# Patient Record
Sex: Female | Born: 1935 | Race: Black or African American | Hispanic: No | Smoking: Never smoker
Health system: Southern US, Community
[De-identification: ages and names within clinical notes are randomized; demographics above are authoritative.]

## PROBLEM LIST (undated history)

## (undated) DIAGNOSIS — I10 Essential (primary) hypertension: Secondary | ICD-10-CM

## (undated) DIAGNOSIS — K219 Gastro-esophageal reflux disease without esophagitis: Secondary | ICD-10-CM

## (undated) DIAGNOSIS — E871 Hypo-osmolality and hyponatremia: Secondary | ICD-10-CM

## (undated) DIAGNOSIS — E119 Type 2 diabetes mellitus without complications: Secondary | ICD-10-CM

## (undated) DIAGNOSIS — I509 Heart failure, unspecified: Secondary | ICD-10-CM

## (undated) DIAGNOSIS — N189 Chronic kidney disease, unspecified: Secondary | ICD-10-CM

## (undated) DIAGNOSIS — E785 Hyperlipidemia, unspecified: Secondary | ICD-10-CM

## (undated) HISTORY — DX: Hyperlipidemia, unspecified: E78.5

## (undated) HISTORY — PX: ANKLE FRACTURE SURGERY: SHX122

## (undated) HISTORY — PX: OTHER SURGICAL HISTORY: SHX169

## (undated) HISTORY — PX: HERNIA REPAIR: SHX51

## (undated) HISTORY — DX: Chronic kidney disease, unspecified: N18.9

## (undated) HISTORY — PX: PACEMAKER INSERTION: SHX728

## (undated) HISTORY — DX: Essential (primary) hypertension: I10

## (undated) HISTORY — DX: Hypo-osmolality and hyponatremia: E87.1

## (undated) HISTORY — DX: Gastro-esophageal reflux disease without esophagitis: K21.9

## (undated) HISTORY — DX: Heart failure, unspecified: I50.9

## (undated) HISTORY — DX: Type 2 diabetes mellitus without complications: E11.9

## (undated) HISTORY — PX: ABDOMINAL HYSTERECTOMY: SHX81

---

## 2006-05-06 ENCOUNTER — Ambulatory Visit: Payer: Self-pay | Admitting: *Deleted

## 2009-02-20 ENCOUNTER — Ambulatory Visit: Payer: Self-pay | Admitting: Vascular Surgery

## 2011-10-01 LAB — HM MAMMOGRAPHY

## 2014-07-03 ENCOUNTER — Emergency Department: Payer: Self-pay | Admitting: Emergency Medicine

## 2014-07-03 LAB — CBC WITH DIFFERENTIAL/PLATELET
Basophil #: 0 10*3/uL (ref 0.0–0.1)
Basophil %: 0.4 %
EOS ABS: 0.1 10*3/uL (ref 0.0–0.7)
Eosinophil %: 0.8 %
HCT: 43.6 % (ref 35.0–47.0)
HGB: 13.7 g/dL (ref 12.0–16.0)
Lymphocyte #: 1.4 10*3/uL (ref 1.0–3.6)
Lymphocyte %: 12.6 %
MCH: 29.9 pg (ref 26.0–34.0)
MCHC: 31.3 g/dL — AB (ref 32.0–36.0)
MCV: 95 fL (ref 80–100)
MONO ABS: 1 x10 3/mm — AB (ref 0.2–0.9)
MONOS PCT: 8.8 %
Neutrophil #: 8.8 10*3/uL — ABNORMAL HIGH (ref 1.4–6.5)
Neutrophil %: 77.4 %
PLATELETS: 297 10*3/uL (ref 150–440)
RBC: 4.57 10*6/uL (ref 3.80–5.20)
RDW: 14.4 % (ref 11.5–14.5)
WBC: 11.4 10*3/uL — ABNORMAL HIGH (ref 3.6–11.0)

## 2014-07-03 LAB — URINALYSIS, COMPLETE
BLOOD: NEGATIVE
GLUCOSE, UR: NEGATIVE mg/dL (ref 0–75)
Hyaline Cast: 13
Ketone: NEGATIVE
Nitrite: NEGATIVE
PH: 5 (ref 4.5–8.0)
RBC,UR: 4 /HPF (ref 0–5)
Specific Gravity: 1.019 (ref 1.003–1.030)
Squamous Epithelial: 36
Transitional Epi: 1

## 2014-07-03 LAB — COMPREHENSIVE METABOLIC PANEL
ANION GAP: 10 (ref 7–16)
Albumin: 3.7 g/dL (ref 3.4–5.0)
Alkaline Phosphatase: 54 U/L
BUN: 61 mg/dL — ABNORMAL HIGH (ref 7–18)
Bilirubin,Total: 0.3 mg/dL (ref 0.2–1.0)
CHLORIDE: 100 mmol/L (ref 98–107)
CO2: 27 mmol/L (ref 21–32)
CREATININE: 2.72 mg/dL — AB (ref 0.60–1.30)
Calcium, Total: 9.3 mg/dL (ref 8.5–10.1)
EGFR (Non-African Amer.): 18 — ABNORMAL LOW
GFR CALC AF AMER: 22 — AB
Glucose: 138 mg/dL — ABNORMAL HIGH (ref 65–99)
Osmolality: 293 (ref 275–301)
POTASSIUM: 4.3 mmol/L (ref 3.5–5.1)
SGOT(AST): 31 U/L (ref 15–37)
SGPT (ALT): 17 U/L
Sodium: 137 mmol/L (ref 136–145)
Total Protein: 8.7 g/dL — ABNORMAL HIGH (ref 6.4–8.2)

## 2014-07-03 LAB — LIPASE, BLOOD: LIPASE: 314 U/L (ref 73–393)

## 2014-07-05 ENCOUNTER — Inpatient Hospital Stay: Payer: Self-pay | Admitting: Surgery

## 2014-07-05 LAB — BASIC METABOLIC PANEL
ANION GAP: 14 (ref 7–16)
Anion Gap: 16 (ref 7–16)
BUN: 91 mg/dL — AB (ref 7–18)
BUN: 89 mg/dL — ABNORMAL HIGH (ref 7–18)
CALCIUM: 8.8 mg/dL (ref 8.5–10.1)
CHLORIDE: 94 mmol/L — AB (ref 98–107)
CHLORIDE: 96 mmol/L — AB (ref 98–107)
CO2: 21 mmol/L (ref 21–32)
CO2: 20 mmol/L — AB (ref 21–32)
Calcium, Total: 8.2 mg/dL — ABNORMAL LOW (ref 8.5–10.1)
Creatinine: 7.37 mg/dL — ABNORMAL HIGH (ref 0.60–1.30)
Creatinine: 7.63 mg/dL — ABNORMAL HIGH (ref 0.60–1.30)
EGFR (African American): 7 — ABNORMAL LOW
GFR CALC AF AMER: 7 — AB
GFR CALC NON AF AMER: 6 — AB
GFR CALC NON AF AMER: 5 — AB
Glucose: 45 mg/dL — ABNORMAL LOW (ref 65–99)
Glucose: 67 mg/dL (ref 65–99)
Osmolality: 284 (ref 275–301)
Osmolality: 290 (ref 275–301)
POTASSIUM: 4.8 mmol/L (ref 3.5–5.1)
Potassium: 4.4 mmol/L (ref 3.5–5.1)
SODIUM: 129 mmol/L — AB (ref 136–145)
Sodium: 132 mmol/L — ABNORMAL LOW (ref 136–145)

## 2014-07-05 LAB — COMPREHENSIVE METABOLIC PANEL
AST: 29 U/L (ref 15–37)
Albumin: 3.5 g/dL (ref 3.4–5.0)
Alkaline Phosphatase: 55 U/L
Anion Gap: 16 (ref 7–16)
BILIRUBIN TOTAL: 0.4 mg/dL (ref 0.2–1.0)
BUN: 87 mg/dL — ABNORMAL HIGH (ref 7–18)
CREATININE: 7.05 mg/dL — AB (ref 0.60–1.30)
Calcium, Total: 8.8 mg/dL (ref 8.5–10.1)
Chloride: 93 mmol/L — ABNORMAL LOW (ref 98–107)
Co2: 21 mmol/L (ref 21–32)
EGFR (Non-African Amer.): 6 — ABNORMAL LOW
GFR CALC AF AMER: 7 — AB
GLUCOSE: 62 mg/dL — AB (ref 65–99)
OSMOLALITY: 285 (ref 275–301)
Potassium: 4.7 mmol/L (ref 3.5–5.1)
SGPT (ALT): 18 U/L
SODIUM: 130 mmol/L — AB (ref 136–145)
TOTAL PROTEIN: 8.8 g/dL — AB (ref 6.4–8.2)

## 2014-07-05 LAB — CBC
HCT: 43.9 % (ref 35.0–47.0)
HGB: 14 g/dL (ref 12.0–16.0)
MCH: 30 pg (ref 26.0–34.0)
MCHC: 31.8 g/dL — ABNORMAL LOW (ref 32.0–36.0)
MCV: 94 fL (ref 80–100)
Platelet: 327 10*3/uL (ref 150–440)
RBC: 4.65 10*6/uL (ref 3.80–5.20)
RDW: 14.1 % (ref 11.5–14.5)
WBC: 11.6 10*3/uL — ABNORMAL HIGH (ref 3.6–11.0)

## 2014-07-05 LAB — LIPASE, BLOOD: Lipase: 483 U/L — ABNORMAL HIGH (ref 73–393)

## 2014-07-06 LAB — CBC WITH DIFFERENTIAL/PLATELET
Basophil #: 0 10*3/uL (ref 0.0–0.1)
Basophil %: 0.3 %
EOS ABS: 0.1 10*3/uL (ref 0.0–0.7)
EOS PCT: 1.1 %
HCT: 39.7 % (ref 35.0–47.0)
HGB: 12.7 g/dL (ref 12.0–16.0)
LYMPHS ABS: 1 10*3/uL (ref 1.0–3.6)
LYMPHS PCT: 11.4 %
MCH: 30.3 pg (ref 26.0–34.0)
MCHC: 32 g/dL (ref 32.0–36.0)
MCV: 95 fL (ref 80–100)
Monocyte #: 0.9 x10 3/mm (ref 0.2–0.9)
Monocyte %: 10.2 %
NEUTROS PCT: 77 %
Neutrophil #: 6.9 10*3/uL — ABNORMAL HIGH (ref 1.4–6.5)
Platelet: 276 10*3/uL (ref 150–440)
RBC: 4.19 10*6/uL (ref 3.80–5.20)
RDW: 14.5 % (ref 11.5–14.5)
WBC: 9 10*3/uL (ref 3.6–11.0)

## 2014-07-06 LAB — COMPREHENSIVE METABOLIC PANEL
ALK PHOS: 47 U/L
ALT: 12 U/L — AB
ANION GAP: 15 (ref 7–16)
Albumin: 3.2 g/dL — ABNORMAL LOW (ref 3.4–5.0)
BUN: 96 mg/dL — ABNORMAL HIGH (ref 7–18)
Bilirubin,Total: 0.3 mg/dL (ref 0.2–1.0)
CALCIUM: 8.3 mg/dL — AB (ref 8.5–10.1)
CREATININE: 8.15 mg/dL — AB (ref 0.60–1.30)
Chloride: 95 mmol/L — ABNORMAL LOW (ref 98–107)
Co2: 21 mmol/L (ref 21–32)
EGFR (African American): 6 — ABNORMAL LOW
EGFR (Non-African Amer.): 5 — ABNORMAL LOW
Glucose: 56 mg/dL — ABNORMAL LOW (ref 65–99)
Osmolality: 290 (ref 275–301)
Potassium: 4.4 mmol/L (ref 3.5–5.1)
SGOT(AST): 29 U/L (ref 15–37)
Sodium: 131 mmol/L — ABNORMAL LOW (ref 136–145)
Total Protein: 7.6 g/dL (ref 6.4–8.2)

## 2014-07-07 LAB — BASIC METABOLIC PANEL
Anion Gap: 13 (ref 7–16)
BUN: 85 mg/dL — ABNORMAL HIGH (ref 7–18)
Calcium, Total: 7.5 mg/dL — ABNORMAL LOW (ref 8.5–10.1)
Chloride: 88 mmol/L — ABNORMAL LOW (ref 98–107)
Co2: 29 mmol/L (ref 21–32)
Creatinine: 7.94 mg/dL — ABNORMAL HIGH (ref 0.60–1.30)
GFR CALC AF AMER: 6 — AB
GFR CALC NON AF AMER: 5 — AB
GLUCOSE: 180 mg/dL — AB (ref 65–99)
OSMOLALITY: 291 (ref 275–301)
POTASSIUM: 3.6 mmol/L (ref 3.5–5.1)
SODIUM: 130 mmol/L — AB (ref 136–145)

## 2014-07-07 LAB — CBC WITH DIFFERENTIAL/PLATELET
BASOS ABS: 0 10*3/uL (ref 0.0–0.1)
Basophil %: 0.2 %
EOS ABS: 0.2 10*3/uL (ref 0.0–0.7)
EOS PCT: 1.8 %
HCT: 35.8 % (ref 35.0–47.0)
HGB: 11.6 g/dL — AB (ref 12.0–16.0)
Lymphocyte #: 0.9 10*3/uL — ABNORMAL LOW (ref 1.0–3.6)
Lymphocyte %: 9.7 %
MCH: 30.1 pg (ref 26.0–34.0)
MCHC: 32.3 g/dL (ref 32.0–36.0)
MCV: 93 fL (ref 80–100)
Monocyte #: 1 x10 3/mm — ABNORMAL HIGH (ref 0.2–0.9)
Monocyte %: 10.3 %
NEUTROS ABS: 7.4 10*3/uL — AB (ref 1.4–6.5)
Neutrophil %: 78 %
Platelet: 258 10*3/uL (ref 150–440)
RBC: 3.84 10*6/uL (ref 3.80–5.20)
RDW: 14 % (ref 11.5–14.5)
WBC: 9.5 10*3/uL (ref 3.6–11.0)

## 2014-07-07 LAB — URINE CULTURE

## 2014-07-08 LAB — RENAL FUNCTION PANEL
Albumin: 2.5 g/dL — ABNORMAL LOW (ref 3.4–5.0)
Anion Gap: 8 (ref 7–16)
BUN: 82 mg/dL — AB (ref 7–18)
CALCIUM: 7.4 mg/dL — AB (ref 8.5–10.1)
CHLORIDE: 82 mmol/L — AB (ref 98–107)
CO2: 36 mmol/L — AB (ref 21–32)
Creatinine: 6.1 mg/dL — ABNORMAL HIGH (ref 0.60–1.30)
EGFR (African American): 9 — ABNORMAL LOW
EGFR (Non-African Amer.): 7 — ABNORMAL LOW
Glucose: 97 mg/dL (ref 65–99)
OSMOLALITY: 278 (ref 275–301)
Phosphorus: 5.1 mg/dL — ABNORMAL HIGH (ref 2.5–4.9)
Potassium: 3.3 mmol/L — ABNORMAL LOW (ref 3.5–5.1)
SODIUM: 126 mmol/L — AB (ref 136–145)

## 2014-07-09 LAB — BASIC METABOLIC PANEL
Anion Gap: 9 (ref 7–16)
BUN: 69 mg/dL — ABNORMAL HIGH (ref 7–18)
CHLORIDE: 86 mmol/L — AB (ref 98–107)
CO2: 36 mmol/L — AB (ref 21–32)
Calcium, Total: 7.7 mg/dL — ABNORMAL LOW (ref 8.5–10.1)
Creatinine: 4.45 mg/dL — ABNORMAL HIGH (ref 0.60–1.30)
EGFR (African American): 12 — ABNORMAL LOW
EGFR (Non-African Amer.): 10 — ABNORMAL LOW
GLUCOSE: 79 mg/dL (ref 65–99)
Osmolality: 282 (ref 275–301)
Potassium: 3.5 mmol/L (ref 3.5–5.1)
SODIUM: 131 mmol/L — AB (ref 136–145)

## 2014-07-12 LAB — PROTEIN ELECTROPHORESIS(ARMC)

## 2014-07-12 LAB — UR PROT ELECTROPHORESIS, URINE RANDOM

## 2014-07-12 LAB — KAPPA/LAMBDA FREE LIGHT CHAINS (ARMC)

## 2014-09-13 ENCOUNTER — Ambulatory Visit: Payer: Self-pay | Admitting: Nephrology

## 2014-12-24 NOTE — Op Note (Signed)
PATIENT NAME:  Patty Roberts, Patty Roberts MR#:  [id number] DATE OF BIRTH:  [birth year]  DATE OF PROCEDURE:  07/06/2014  PREOPERATIVE DIAGNOSIS: Ventral hernia.   POSTOPERATIVE DIAGNOSIS: Ventral hernia.   OPERATION:  Ventral hernia repair.   ANESTHESIA: General.   SURGEON: Quentin Orealph L. Ely III, MD.   DESCRIPTION OF PROCEDURE: With the patient in the  supine position after the induction of appropriate general anesthesia, the patient's abdomen was prepped with ChloraPrep and draped with sterile towels. A midline incision was made above the palpable defect, carried down through the subcutaneous tissues with Bovie electrocautery. The hernia sac was immediately identified and dissected back to its base in the fascia without difficulty. The defect itself did not appear that enlarged. Part of the sac was amputated and the remainder was reduced. There did not appear to be any incarcerated bowel. The preperitoneal plane was developed with both a combination of blunt and Bovie dissection. A piece of Ventralex-XT mesh was placed into the preperitoneal space. The wings removed and the mesh sutured in place with 0 Surgilon. The defect was closed in a pants-over-vest closure with 0 Surgilon. The area was irrigated. The skin was clipped. A compressive dressing was applied. The patient was returned to the recovery room having tolerated the procedure well. Sponge, instrument, and needle counts were correct x 2 in the operating room.    ____________________________ Carmie Endalph L. Ely III, MD rle:at D: 07/06/2014 14:10:52 ET T: 07/06/2014 17:48:19 ET JOB#: [id number]  cc: Carmie Endalph L. Ely III, MD, <Dictator> Quentin OreALPH L ELY MD ELECTRONICALLY SIGNED 104-23-2015 13:47

## 2014-12-24 NOTE — Consult Note (Signed)
PATIENT NAME:  Patty FowlerBUSH, Loany W MR#:  [id number] DATE OF BIRTH:  01/09/1932  DATE OF CONSULTATION:  07/05/2014  DATE OF ADMISSION:  07/05/2014  PRIMARY CARE PHYSICIAN:  Elnita Maxwellheryl A. Jamesetta OrleansWicker, NP  REFERRING PHYSICIAN:   CONSULTING PHYSICIAN:  Quentin Orealph L. Ely III, MD  CHIEF COMPLAINT:  Abdominal pain, nausea, vomiting.   BRIEF HISTORY: Patty Roberts is a 81 year old woman seen in the Emergency Room with a several week history of intermittent abdominal pain, nausea, and vomiting. The symptoms worsened over this past weekend and she was seen in the Emergency Room. She was diagnosed with urinary tract infection and started on antibiotics. She continued to have intermittent abdominal pain, intermittent nausea and vomiting. The family is very concerned and had set up an appointment with her primary care doctor today. Because of her worsening symptoms she returned to the Emergency Room for further evaluation.   She has had history of slowly increasing abdominal symptoms over several months. They have certainly worsened in a short period of time but then chronically bothering her. She denies history of hepatitis, yellow jaundice, pancreatitis, peptic ulcer disease or diverticulitis. She does have a history of biliary tract surgery with a previous cholecystectomy to an open midline incision. She has also had a hysterectomy. She denies any cardiac disease or thyroid problems. She is diabetic and hypertensive. She has a history of chronic kidney disease.   Work-up in the Emergency Room revealed a slightly elevated white blood cell count. Creatinine was over 7 when it was 2.7 forty-eight hours ago. She is not acidotic. Lipase is slightly elevated at greater than 480. CT scan was performed which revealed what was read as a strangulated midline ventral hernia with evidence of significant obstruction. The surgical service was consulted.   MEDICATIONS: Well outlined on her admission note. She takes amlodipine, Bystolic, clonidine,  Crestor, cyclobenzaprine, furosemide, glimepiride, hydralazine, meclizine, omeprazole, Pioglitazone, and Tekturna.    ALLERGIES: CORRECTOL.   REVIEW OF SYSTEMS:   Otherwise unremarkable.   PHYSICAL EXAMINATION: GENERAL: She is lying in bed quite comfortably with no complaints of abdominal pain or nausea the present time.  VITAL SIGNS: Blood pressure is 112/72. Heart rate is 95. Respirations are 18, temperature is 97.6.  HEENT: Unremarkable. She is no scleral icterus, pupillary abnormalities.  NECK: Supple, nontender, with midline trachea.  CHEST: Clear with no adventitious sounds. She has normal pulmonary excursion.  CARDIAC: No murmurs or gallops to my ear and she seems to be in normal sinus rhythm.  ABDOMEN: Soft with no abdominal tenderness. She does have a midline hernia just above the umbilicus at the site of her previous surgery. It is reducible. She did not have any tenderness or any evidence of discoloration. She has active bowel sounds. With straining or coughing, the hernia mass recurs almost immediately but, again, is reducible.  EXTREMITIES:  Full range of motion, no deformities.  PSYCHIATRIC: Normal orientation, normal affect.  I have independently reviewed her CT scan. I am not certain how he can read strangulation on the CT scan, but there certainly is evidence of incarceration with bowel obstruction. I can reduce her hernia. With her sudden dehydration, I think we will replete her fluids.  I asked the medical doctors to remain vigilant with regard to her urine output and hemodynamics. We will tentatively plan for surgery tomorrow. This plan has been outlined to the patient in detail. The family is in agreement.  Should she worsened over the next 12 to 24 hours, we will certainly intervene  more rapidly, but it does appear to be a nonobstructive problem at the present time.      ____________________________ Carmie End, MD rle:jp D: 07/05/2014 14:57:52 ET T: 07/05/2014  16:35:52 ET JOB#: [id number]  cc: Carmie End, MD, <Dictator> Phillips Odor. Jamesetta Orleans, NP Quentin Ore MD ELECTRONICALLY SIGNED [id number] 13:47

## 2014-12-24 NOTE — Discharge Summary (Signed)
PATIENT NAME:  Patty Roberts, Patty Roberts MR#:  [id number] DATE OF BIRTH:  04/13/1932  DATE OF ADMISSION:  07/05/2014 DATE OF DISCHARGE:  07/09/2014  BRIEF HISTORY:  Miss Patty Roberts is a 81 year old woman who presented to the Emergency Room with abdominal pain and what appeared to be an incarcerated ventral hernia. She had been seen in the Emergency Room 48 hours before with abdominal pain, nausea and vomiting. She had been diagnosed as having a urinary tract infection. Her creatinine was baseline, in the high 2s at that time. She returned 48 hours later with increasing abdominal pain, increasing nausea and vomiting. Creatinine was over 7 at that point. CT scan was performed which demonstrated what was felt to be an incarcerated, strangulated ventral hernia. The surgical service was consulted urgently. I was able to reduce the hernia in the Emergency Room. She had no significant problems preoperatively. We had the patient seen by the renal service, who felt that her increasing creatinine was likely related to ATN.    The patient was taken to surgery on the morning of the November 4 where she underwent ventral hernia repair. A mesh was placed in the small defect. The defect was easily reduced. She had no significant postoperative problems. Her creatinine peaked at 2 but her electrolytes remained normal. I did not see any evidence for continued bowel obstruction. She has improved over the last 48 hours and her creatinine is down to 4.4. She is up ambulating without difficulty. Her wounds look good. There is no sign of any infection. I will discharge her home today to be followed in the office in 3-5 days.   DISCHARGE MEDICATIONS INCLUDE:  Zofran 4 mg every 8 hours p.r.n., Levaquin 250 mg for 5 more days treating her urinary tract infection, pioglitazone 30 mg once a day, amlodipine 10 mg once a day, Bystolic 10 mg 2 tablets once a day, fexofenadine 180 mg once a day, furosemide 40 mg b.i.d., glimepiride 2 mg once a day,  hydralazine 25 mg 3 times a day, meclizine 25 mg every 6 hours p.r.n., omeprazole 20 mg once a day, clonidine transdermal 0.3 mg once a week, cyclobenzaprine 10 mg 3 times a day, Crestor 5 mg once a day, Tekturna 300 mg once a day, and Vicodin 5/300 mg every 6 hours p.r.n.   FINAL DISCHARGE DIAGNOSES:  1.  Incarcerated ventral hernia.  2.  Chronic renal disease with acute exacerbation.   SURGERY:  Ventral hernia repair.     ____________________________ Carmie Endalph L. Ely III, MD rle:lt D: 07/09/2014 08:28:13 ET T: 07/09/2014 12:13:36 ET JOB#: [id number]  cc: Carmie Endalph L. Ely III, MD, <Dictator> Laverda SorensonSarath C. Kolluru, MD Phillips Odorheryl A. Jamesetta OrleansWicker, NP Quentin OreALPH L ELY MD ELECTRONICALLY SIGNED 07/13/2014 13:47

## 2014-12-24 NOTE — H&P (Signed)
PATIENT NAME:  Patty Roberts, Patty Roberts MR#:  [id number] DATE OF BIRTH:  1932/05/24  DATE OF ADMISSION:  07/05/2014  PRIMARY CARE PHYSICIAN:  Gabriel Cirri at  [facility]   CHIEF COMPLAINT: Nausea and vomiting.   HISTORY OF PRESENT ILLNESS: Patty Roberts is a 81 year old African-American female with past medical history significant for hypertension, diabetes, sick sinus syndrome status post pacemaker, comes to the hospital secondary to nausea and vomiting for at least 4 days now. The patient states her symptoms started about 2 weeks ago. She has been having nausea on and off associated with dry heaves. Unable to keep anything down. Symptoms got worse about 4 days ago. She also developed lower abdominal pain at the time and presented to the ER. On 07/07/2014 in the Emergency Room, she had urinary tract infection; however, her BUN was elevated at 61. Creatinine was at 2.72. She was discharged on Levaquin antibiotic. The patient does have chronic renal insufficiency. Baseline creatinine unknown, but according to the patient she has seen Dr. Thedore Mins less than a year ago and her GFR is supposed to be between 30 and 45. She went home. Her abdominal pain has improved. She took her Levaquin yesterday.  Her  nausea and vomiting have been persistent and she also feels that her urine output is decreased, so presents to the Emergency Room. In the ER, she is noted to have creatinine elevation from 2.7 to 7.05 today with a normal potassium.  So she is being admitted for acute renal failure.   PAST MEDICAL HISTORY: 1.  Hypertension.  2.  Chronic kidney disease stage 3. 3.  Diabetes mellitus.  4.  Pacemaker placement.   PAST SURGICAL HISTORY:  1.  Cholecystectomy.  2.  Ankle fracture.  3.  Pacemaker placement.   ALLERGIES TO MEDICATIONS:  CORRECTOL.  CURRENT HOME MEDICATIONS: 1.  Norvasc 10 mg p.o. daily.  2.  Bystolic 20 mg p.o. daily.  3.  Clonidine 0.3 mg transdermal patch once a week on Sunday.  4.  Crestor  5 mg p.o. at bedtime.  5.  Flexeril 10 mg p.o. 3 times a day as needed for muscle spasm.  6.  Allegra 180 mg p.o. daily.  7.  Lasix 40 mg p.o. b.i.d.  8.  Glimepiride 2 mg 1 tablet once a day.  9.  Hydralazine 25 mg 3 times a day.  10.  Levaquin 250 mg p.o. daily for 5 days, which was just started on 07/04/2014.  11.  Meclizine 25 mg q.6h. p.r.n. for dizziness.  12.  Omeprazole 20 mg p.o. daily p.r.n. for indigestion.  13.  Zofran 4 mg q.8h. p.r.n. for nausea and vomiting.  14.  Actos 30 mg p.o. daily.  15.  Tekturna 300 mg p.o. daily.   SOCIAL HISTORY: Lives at home with her daughter.  Very independent at baseline. No smoking or alcohol use.   FAMILY HISTORY: Mom also had a cardiac issues and pacemaker placement.  REVIEW OF SYSTEMS: CONSTITUTIONAL: No fever, fatigue or weakness.  EYES: No blurred vision, double vision, inflammation or glaucoma.  ENT: No tinnitus, ear pain, hearing loss, epistaxis or discharge.  RESPIRATORY: No cough, wheeze, hemoptysis or COPD.  CARDIOVASCULAR: No chest pain, orthopnea, edema, arrhythmia, palpitations or syncope.  GASTROINTESTINAL: Positive for nausea, vomiting. No diarrhea. No abdominal pain. No hematemesis or melena.  GENITOURINARY: Positive for oliguria. No dysuria, hematuria, frequency or incontinence.  ENDOCRINE: No polyuria, nocturia, thyroid problems, heat or cold intolerance.  HEMATOLOGY: No anemia, easy bruising or bleeding.  SKIN: No acne, rash or lesions.  MUSCULOSKELETAL: No neck, back, shoulder pain, arthritis or gout.  NEUROLOGIC: No numbness, weakness, CVA, TIA or seizures.  PSYCHOLOGICAL: No anxiety, insomnia or depression.   PHYSICAL EXAMINATION: VITAL SIGNS: Temperature 97.7 degrees Fahrenheit, pulse 55, respirations 19, blood pressure 103/55. Pulse oximetry 96% on room air.  GENERAL: Well-built, well-nourished female lying in bed, not in any acute distress.  HEENT: Normocephalic, atraumatic. Pupils equal, round, reacting to  light. Anicteric sclerae. Extraocular movements intact.  OROPHARYNX: Clear without erythema, mass or exudates.  NECK: Supple. No thyromegaly, JVD or carotid bruits. No lymphadenopathy.  LUNGS: Moving air bilaterally. No wheeze or crackles. No use of accessory muscles for breathing.  CARDIOVASCULAR: S1, S2. Regular rate and rhythm. No murmurs, rubs or gallops.  ABDOMEN: Soft, nontender, nondistended. No hepatosplenomegaly. Normal bowel sounds.  EXTREMITIES: No pedal edema. No clubbing or cyanosis.  There are 2+ dorsalis pedis pulses palpable bilaterally.  SKIN: No acne, rash or lesions.  LYMPHATICS: No cervical lymphadenopathy.  NEUROLOGIC: Cranial nerves intact. No focal motor or sensory deficits.  PSYCHOLOGICAL: The patient is awake, alert, oriented x3.   LABORATORY DATA: WBC 11.6, hemoglobin 14, hematocrit 43.9, platelet count 327,000.  Sodium 130, potassium 4.7, chloride 93, bicarbonate 21.  BUN 87, creatinine 7.05 Glucose 62. Calcium of 8.8. ALT 18, AST 29, alkaline phosphatase 55, total bilirubin 0.4, albumin of 3.5, lipase 483.  Urinalysis from 2 days ago showing 1+ bacteria, 1+ leukocyte esterase and 24wbcs.  DIAGNOSTIC DATA:  Ultrasound of the abdomen limited survey that does showing dilatation of common bile duct which is 21 mm. Prior cholecystectomy done..   ASSESSMENT AND PLAN: A 81 year old female with history of hypertension, diabetes, chronic kidney disease, admitted for nausea vomiting and noted to be in acute on chronic renal failure.   1.  Acute renal failure, chronic kidney disease, unknown creatinine at baseline: Seen by Dr. Thedore MinsSingh as an outpatient and per patient she is stage 3.  GFR of 30-45.  Now GFR is at 6 with creatinine of 7.  Still making urine but very oliguric, so will admit, IV fluids.  Could be pre-renal because of nausea, vomiting and poor oral intake for 5 days now. Strict I's and O's.  Nephrology consult. CT of the abdomen ordered.  Will follow up on the kidneys. No  acute indication for dialysis at this time.   Will hold off on Lasix as the patient was taking Lasix at home. 2.  Urinary tract infection:  Follow up cultures. On Rocephin. 3.  Nausea, vomiting:  Could be from uremia. Ultrasound done 2 days ago in ER with dilated common bile duct. The patient is status post cholecystectomy. Also normal LFTs. No obstruction likely. Follow up on the CT. If needed, will order MRCP.  Lipase likely elevated.  Could be reactive. Will monitor and follow up.  4.  Hypertension:  Continue home medications.  5.  Diabetes:  Hold Amaryl and Actos as she is only on a liquid diet. Continue sliding scale insulin.   CODE STATUS: Full code.   Time spent on admission is 50 minutes.   ____________________________ Enid Baasadhika Maguadalupe Lata, MD rk:jw D: 07/05/2014 12:37:15 ET T: 07/05/2014 13:11:35 ET JOB#: [id number]  cc: Enid Baasadhika Valentine Kuechle, MD, <Dictator> [facility] Dr. Turner DanielsSingh    Lavon Horn MD ELECTRONICALLY SIGNED 07/23/2014 14:45

## 2015-02-20 ENCOUNTER — Other Ambulatory Visit: Payer: Self-pay | Admitting: Unknown Physician Specialty

## 2015-04-11 DIAGNOSIS — E785 Hyperlipidemia, unspecified: Secondary | ICD-10-CM | POA: Insufficient documentation

## 2015-04-11 DIAGNOSIS — E1122 Type 2 diabetes mellitus with diabetic chronic kidney disease: Secondary | ICD-10-CM | POA: Insufficient documentation

## 2015-04-11 DIAGNOSIS — I129 Hypertensive chronic kidney disease with stage 1 through stage 4 chronic kidney disease, or unspecified chronic kidney disease: Secondary | ICD-10-CM

## 2015-04-11 DIAGNOSIS — N183 Chronic kidney disease, stage 3 unspecified: Secondary | ICD-10-CM

## 2015-04-11 DIAGNOSIS — N184 Chronic kidney disease, stage 4 (severe): Secondary | ICD-10-CM | POA: Insufficient documentation

## 2015-04-11 DIAGNOSIS — R011 Cardiac murmur, unspecified: Secondary | ICD-10-CM | POA: Insufficient documentation

## 2015-04-11 DIAGNOSIS — K219 Gastro-esophageal reflux disease without esophagitis: Secondary | ICD-10-CM | POA: Insufficient documentation

## 2015-04-12 ENCOUNTER — Ambulatory Visit (INDEPENDENT_AMBULATORY_CARE_PROVIDER_SITE_OTHER): Payer: Medicare Other | Admitting: Unknown Physician Specialty

## 2015-04-12 ENCOUNTER — Encounter: Payer: Self-pay | Admitting: Unknown Physician Specialty

## 2015-04-12 VITALS — BP 138/81 | HR 108 | Temp 97.5°F | Ht 60.2 in | Wt 227.6 lb

## 2015-04-12 DIAGNOSIS — N183 Chronic kidney disease, stage 3 unspecified: Secondary | ICD-10-CM

## 2015-04-12 DIAGNOSIS — N181 Chronic kidney disease, stage 1: Secondary | ICD-10-CM | POA: Diagnosis not present

## 2015-04-12 DIAGNOSIS — N182 Chronic kidney disease, stage 2 (mild): Secondary | ICD-10-CM

## 2015-04-12 DIAGNOSIS — N189 Chronic kidney disease, unspecified: Secondary | ICD-10-CM | POA: Diagnosis not present

## 2015-04-12 DIAGNOSIS — N185 Chronic kidney disease, stage 5: Secondary | ICD-10-CM | POA: Diagnosis not present

## 2015-04-12 DIAGNOSIS — M545 Low back pain, unspecified: Secondary | ICD-10-CM

## 2015-04-12 DIAGNOSIS — N184 Chronic kidney disease, stage 4 (severe): Secondary | ICD-10-CM

## 2015-04-12 DIAGNOSIS — E785 Hyperlipidemia, unspecified: Secondary | ICD-10-CM

## 2015-04-12 DIAGNOSIS — I129 Hypertensive chronic kidney disease with stage 1 through stage 4 chronic kidney disease, or unspecified chronic kidney disease: Secondary | ICD-10-CM

## 2015-04-12 DIAGNOSIS — E1122 Type 2 diabetes mellitus with diabetic chronic kidney disease: Secondary | ICD-10-CM | POA: Diagnosis not present

## 2015-04-12 LAB — LIPID PANEL PICCOLO, WAIVED
CHOL/HDL RATIO PICCOLO,WAIVE: 2.7 mg/dL
Cholesterol Piccolo, Waived: 154 mg/dL (ref <200)
HDL Chol Piccolo, Waived: 57 mg/dL — ABNORMAL LOW (ref >59)
LDL Chol Calc Piccolo Waived: 47 mg/dL (ref <100)
TRIGLYCERIDES PICCOLO,WAIVED: 253 mg/dL — AB (ref <150)
VLDL CHOL CALC PICCOLO,WAIVE: 51 mg/dL — AB (ref <30)

## 2015-04-12 LAB — BAYER DCA HB A1C WAIVED: HB A1C (BAYER DCA - WAIVED): 6.7 % (ref <7.0)

## 2015-04-12 NOTE — Assessment & Plan Note (Signed)
Encouraged starting a walking program

## 2015-04-12 NOTE — Assessment & Plan Note (Signed)
LDL was 47.  Continue present

## 2015-04-12 NOTE — Assessment & Plan Note (Signed)
Check CMP.  ?

## 2015-04-12 NOTE — Progress Notes (Signed)
BP 138/81 mmHg  Pulse 108  Temp(Src) 97.5 F (36.4 C)  Ht 5' 0.2" (1.529 m)  Wt 227 lb 9.6 oz (103.239 kg)  BMI 44.16 kg/m2  SpO2 95%  LMP  (LMP Unknown)   Subjective:    Patient ID: Patty Roberts, female    DOB: 1932-01-02, 81 y.o.   MRN: [id number]  HPI: Patty Roberts is a 81 y.o. female  Chief Complaint  Patient presents with  . Diabetes  . Hypertension  . Hyperlipidemia   1.  DIABETES Hypoglycemic episodes:  no  H8  Polydipsia/polyuria:  no  H8 Visual disturbance:  no  R2  Chest pain:  no  R4  Paresthesias:  no  R10  Glucose Monitoring:  yes   Accucheck frequency:  occasional Fasting glucose:  90's  Hgb A1C 6.9 last february GLUCOSE: 199 Blood Pressure Monitoring:  not checking.  H3 Retinal Examination:  Up to date May or June P1 Foot Exam: Foot Exam Done on 08/12/12 P1 Diabetic Education:  completed   P1 Pneumovax:  Up to date  P1  Influenza:  Up to date  P1 Aspirin:  Aspirin Therapy Not applicable on 08/12/12  2.  HYPERTENSION / HYPERLIPIDEMIA Seeing Nephrology Patient is requesting refill(s). Satisfied with current treatment?  no H6  Duration of hypertension:  chronic  H4  BP monitoring frequency:  not checking.  H5  BP medication side effects:  no P1 Past BP meds:  none  P1  Duration of hyperlipidemia:  chronic  H4  Cholesterol medication side effects:  no P1  Cholesterol supplements:  none  P1 Past cholesterol medications:  none  P1 Medication compliance:  excellent compliance  P1  Aspirin:  Aspirin Therapy Not applicable on 08/12/12 Recent stressors:  no  H6   Recurrent headaches:  no  R10 Visual changes:  no  R2  Palpitations:  no  R4  Dyspnea:  no  R5  Chest pain:  no  R4  Lower extremity edema:  no  R4  Dizzy/lightheaded:  no  R10    Relevant past medical, surgical, family and social history reviewed and updated as indicated. Interim medical history since our last visit reviewed. Allergies and medications reviewed and updated.  Review of  Systems  Musculoskeletal: Positive for back pain.       Back hurting. Worse after standing and sitting for a while    Per HPI unless specifically indicated above     Objective:    BP 138/81 mmHg  Pulse 108  Temp(Src) 97.5 F (36.4 C)  Ht 5' 0.2" (1.529 m)  Wt 227 lb 9.6 oz (103.239 kg)  BMI 44.16 kg/m2  SpO2 95%  LMP  (LMP Unknown)  Wt Readings from Last 3 Encounters:  04/12/15 227 lb 9.6 oz (103.239 kg)  01/25/15 221 lb (100.245 kg)    Physical Exam  Constitutional: She is oriented to person, place, and time. She appears well-developed and well-nourished. No distress.  HENT:  Head: Normocephalic and atraumatic.  Eyes: Conjunctivae and lids are normal. Right eye exhibits no discharge. Left eye exhibits no discharge. No scleral icterus.  Cardiovascular: Normal rate, regular rhythm and normal heart sounds.   Pulmonary/Chest: Effort normal and breath sounds normal. No respiratory distress.  Abdominal: Normal appearance. There is no splenomegaly or hepatomegaly.  Musculoskeletal: Normal range of motion.  Neurological: She is alert and oriented to person, place, and time.  Skin: Skin is intact. No rash noted. No pallor.  Psychiatric: She has a  normal mood and affect. Her behavior is normal. Judgment and thought content normal.    Results for orders placed or performed in visit on 04/11/15  HM MAMMOGRAPHY  Result Value Ref Range   HM Mammogram from PP       Assessment & Plan:   Problem List Items Addressed This Visit      Unprioritized   Hypertensive CKD (chronic kidney disease) - Primary   Relevant Orders   Bayer DCA Hb A1c Waived   Comprehensive metabolic panel   Hyperlipidemia    LDL was 47.  Continue present      Relevant Medications   hydrALAZINE (APRESOLINE) 50 MG tablet   Other Relevant Orders   Lipid Panel Piccolo, Waived   Type 2 diabetes mellitus with stage 3 chronic kidney disease    Stable.  Check Hgb A1C      CKD (chronic kidney disease), stage  III    Check CMP      Relevant Orders   Comprehensive metabolic panel   Low back pain    Encouraged starting a walking program          Follow up plan: Return in about 6 months (around 10/13/2015) for physical.

## 2015-04-12 NOTE — Assessment & Plan Note (Signed)
Stable.  Check Hgb A1C

## 2015-04-13 LAB — COMPREHENSIVE METABOLIC PANEL
A/G RATIO: 1.4 (ref 1.1–2.5)
ALT: 11 IU/L (ref 0–32)
AST: 21 IU/L (ref 0–40)
Albumin: 4.2 g/dL (ref 3.5–4.7)
Alkaline Phosphatase: 56 IU/L (ref 39–117)
BUN/Creatinine Ratio: 21 (ref 11–26)
BUN: 41 mg/dL — AB (ref 8–27)
Bilirubin Total: 0.2 mg/dL (ref 0.0–1.2)
CO2: 24 mmol/L (ref 18–29)
CREATININE: 1.93 mg/dL — AB (ref 0.57–1.00)
Calcium: 9.2 mg/dL (ref 8.7–10.3)
Chloride: 99 mmol/L (ref 97–108)
GFR calc Af Amer: 28 mL/min/{1.73_m2} — ABNORMAL LOW (ref >59)
GFR, EST NON AFRICAN AMERICAN: 24 mL/min/{1.73_m2} — AB (ref >59)
GLOBULIN, TOTAL: 3.1 g/dL (ref 1.5–4.5)
GLUCOSE: 132 mg/dL — AB (ref 65–99)
Potassium: 5.2 mmol/L (ref 3.5–5.2)
Sodium: 139 mmol/L (ref 134–144)
TOTAL PROTEIN: 7.3 g/dL (ref 6.0–8.5)

## 2015-04-26 ENCOUNTER — Other Ambulatory Visit: Payer: Self-pay | Admitting: Unknown Physician Specialty

## 2015-06-06 ENCOUNTER — Other Ambulatory Visit: Payer: Self-pay | Admitting: Unknown Physician Specialty

## 2015-06-12 ENCOUNTER — Other Ambulatory Visit: Payer: Self-pay | Admitting: Unknown Physician Specialty

## 2015-06-20 ENCOUNTER — Telehealth: Payer: Self-pay

## 2015-06-20 NOTE — Telephone Encounter (Signed)
Called patient to ask about colorectal screening and eye exam. Patient was not interested in any type of colorectal screening and said she just had [facility] eye exam [facility] [place].

## 2015-06-28 ENCOUNTER — Other Ambulatory Visit: Payer: Self-pay

## 2015-06-28 MED ORDER — OMEPRAZOLE 20 MG PO CPDR: 20.0000 mg | DELAYED_RELEASE_CAPSULE | ORAL | Status: DC

## 2015-06-28 NOTE — Telephone Encounter (Signed)
Patient was last seen 04/12/15 and pharmacy is [facility].

## 2015-08-20 ENCOUNTER — Other Ambulatory Visit: Payer: Self-pay | Admitting: Unknown Physician Specialty

## 2015-08-24 ENCOUNTER — Other Ambulatory Visit: Payer: Self-pay | Admitting: Unknown Physician Specialty

## 2015-09-10 ENCOUNTER — Other Ambulatory Visit: Payer: Self-pay | Admitting: Unknown Physician Specialty

## 2015-10-26 ENCOUNTER — Other Ambulatory Visit: Payer: Self-pay | Admitting: Unknown Physician Specialty

## 2015-10-26 NOTE — Telephone Encounter (Signed)
Pt due to be seen further refills

## 2015-11-01 ENCOUNTER — Encounter: Payer: Self-pay | Admitting: Unknown Physician Specialty

## 2015-11-01 ENCOUNTER — Ambulatory Visit (INDEPENDENT_AMBULATORY_CARE_PROVIDER_SITE_OTHER): Payer: Medicare Other | Admitting: Unknown Physician Specialty

## 2015-11-01 VITALS — BP 131/77 | HR 101 | Temp 97.5°F | Ht 60.6 in | Wt 227.4 lb

## 2015-11-01 DIAGNOSIS — IMO0002 Reserved for concepts with insufficient information to code with codable children: Secondary | ICD-10-CM

## 2015-11-01 DIAGNOSIS — E1165 Type 2 diabetes mellitus with hyperglycemia: Secondary | ICD-10-CM

## 2015-11-01 DIAGNOSIS — Z23 Encounter for immunization: Secondary | ICD-10-CM

## 2015-11-01 DIAGNOSIS — E785 Hyperlipidemia, unspecified: Secondary | ICD-10-CM | POA: Diagnosis not present

## 2015-11-01 DIAGNOSIS — I129 Hypertensive chronic kidney disease with stage 1 through stage 4 chronic kidney disease, or unspecified chronic kidney disease: Secondary | ICD-10-CM | POA: Diagnosis not present

## 2015-11-01 DIAGNOSIS — I10 Essential (primary) hypertension: Secondary | ICD-10-CM

## 2015-11-01 DIAGNOSIS — N183 Chronic kidney disease, stage 3 (moderate): Secondary | ICD-10-CM | POA: Diagnosis not present

## 2015-11-01 DIAGNOSIS — N184 Chronic kidney disease, stage 4 (severe): Secondary | ICD-10-CM

## 2015-11-01 DIAGNOSIS — E1122 Type 2 diabetes mellitus with diabetic chronic kidney disease: Secondary | ICD-10-CM | POA: Diagnosis not present

## 2015-11-01 DIAGNOSIS — Z0000 Encounter for general adult medical examination without abnormal findings: Secondary | ICD-10-CM

## 2015-11-01 MED ORDER — GLIMEPIRIDE 2 MG PO TABS: 2.0000 mg | ORAL_TABLET | Freq: Every day | ORAL | Status: DC

## 2015-11-01 MED ORDER — HYDRALAZINE HCL 50 MG PO TABS: 50.0000 mg | ORAL_TABLET | Freq: Three times a day (TID) | ORAL | Status: DC

## 2015-11-01 MED ORDER — FUROSEMIDE 40 MG PO TABS: 40.0000 mg | ORAL_TABLET | Freq: Every day | ORAL | Status: DC

## 2015-11-01 MED ORDER — AMLODIPINE BESYLATE 10 MG PO TABS: 10.0000 mg | ORAL_TABLET | Freq: Every day | ORAL | Status: DC

## 2015-11-01 MED ORDER — OMEPRAZOLE 20 MG PO CPDR: 20.0000 mg | DELAYED_RELEASE_CAPSULE | ORAL | Status: DC

## 2015-11-01 MED ORDER — NEBIVOLOL HCL 10 MG PO TABS: 10.0000 mg | ORAL_TABLET | Freq: Every day | ORAL | Status: DC

## 2015-11-01 MED ORDER — ROSUVASTATIN CALCIUM 5 MG PO TABS: 5.0000 mg | ORAL_TABLET | Freq: Every day | ORAL | Status: DC

## 2015-11-01 MED ORDER — CLONIDINE HCL 0.3 MG/24HR TD PTWK: 0.3000 mg | MEDICATED_PATCH | TRANSDERMAL | Status: DC

## 2015-11-01 MED ORDER — ALISKIREN FUMARATE 300 MG PO TABS: 300.0000 mg | ORAL_TABLET | Freq: Every day | ORAL | Status: DC

## 2015-11-01 MED ORDER — PIOGLITAZONE HCL 30 MG PO TABS: 30.0000 mg | ORAL_TABLET | Freq: Every day | ORAL | Status: DC

## 2015-11-01 NOTE — Assessment & Plan Note (Signed)
Hgb A1C is stable at 7.2.  Continue present medications

## 2015-11-01 NOTE — Assessment & Plan Note (Signed)
Stable, continue present medications.   

## 2015-11-01 NOTE — Assessment & Plan Note (Signed)
Per nephrology 

## 2015-11-01 NOTE — Assessment & Plan Note (Signed)
On Crestor and stable.  Continue present treatment

## 2015-11-01 NOTE — Progress Notes (Signed)
BP 131/77 mmHg  Pulse 101  Temp(Src) 97.5 F (36.4 C)  Ht 5' 0.6" (1.539 m)  Wt 227 lb 6.4 oz (103.148 kg)  BMI 43.55 kg/m2  SpO2 93%  LMP  (LMP Unknown)   Subjective:    Patient ID: Patty Roberts, female    DOB: May 22, 1932, 82 y.o.   MRN: [id number]  HPI: Patty Roberts is a 82 y.o. female  Chief Complaint  Patient presents with  . Medicare Wellness   Functional Status Survey: Is the patient deaf or have difficulty hearing?: No Does the patient have difficulty seeing, even when wearing glasses/contacts?: No Does the patient have difficulty concentrating, remembering, or making decisions?: No Does the patient have difficulty walking or climbing stairs?: No Does the patient have difficulty dressing or bathing?: No Does the patient have difficulty doing errands alone such as visiting a doctor's office or shopping?: No  Depression screen PHQ 2/9 11/01/2015  Decreased Interest 0  Down, Depressed, Hopeless 0  PHQ - 2 Score 0    Fall Risk  11/01/2015  Falls in the past year? No    Pt with CKD and sees Nephrology.  They did labs in January that I reviewed.  Labs she stage 4 CKD.    Diabetes:  Hgb A1C was 7.2 Using medications without difficulties No hypoglycemic episodes No hyperglycemic episodes Feet problems: none Blood Sugars averaging: "like 100" eye exam within last year  Hypertension:  Using medications without difficulty Average home BPsNot checking  Using medication without problems or lightheadedness No chest pain with exertion or shortness of breath No Edema   Elevated Cholesterol: Using medications without problems No Muscle aches Not eating meat and eating mostly fish   Relevant past medical, surgical, family and social history reviewed and updated as indicated. Interim medical history since our last visit reviewed. Allergies and medications reviewed and updated.  Review of Systems  Per HPI unless specifically indicated above     Objective:     BP 131/77 mmHg  Pulse 101  Temp(Src) 97.5 F (36.4 C)  Ht 5' 0.6" (1.539 m)  Wt 227 lb 6.4 oz (103.148 kg)  BMI 43.55 kg/m2  SpO2 93%  LMP  (LMP Unknown)  Wt Readings from Last 3 Encounters:  11/01/15 227 lb 6.4 oz (103.148 kg)  04/12/15 227 lb 9.6 oz (103.239 kg)  01/25/15 221 lb (100.245 kg)    Physical Exam  Constitutional: She is oriented to person, place, and time. She appears well-developed and well-nourished. No distress.  HENT:  Head: Normocephalic and atraumatic.  Eyes: Conjunctivae and lids are normal. Right eye exhibits no discharge. Left eye exhibits no discharge. No scleral icterus.  Neck: Normal range of motion. Neck supple. No JVD present. Carotid bruit is not present.  Cardiovascular: Normal rate, regular rhythm and normal heart sounds.   Pulmonary/Chest: Effort normal and breath sounds normal.  Abdominal: Normal appearance. There is no splenomegaly or hepatomegaly.  Musculoskeletal: Normal range of motion.  Neurological: She is alert and oriented to person, place, and time.  Skin: Skin is warm, dry and intact. No rash noted. No pallor.  Psychiatric: She has a normal mood and affect. Her behavior is normal. Judgment and thought content normal.     Assessment & Plan:   Problem List Items Addressed This Visit      Unprioritized   Hypertensive CKD (chronic kidney disease)    Stable, continue present medications.        Hyperlipidemia - Primary  On Crestor and stable.  Continue present treatment      Relevant Medications   aliskiren (TEKTURNA) 300 MG tablet   amLODipine (NORVASC) 10 MG tablet   cloNIDine (CATAPRES - DOSED IN MG/24 HR) 0.3 mg/24hr patch   furosemide (LASIX) 40 MG tablet   hydrALAZINE (APRESOLINE) 50 MG tablet   nebivolol (BYSTOLIC) 10 MG tablet   rosuvastatin (CRESTOR) 5 MG tablet   Uncontrolled type 2 diabetes mellitus with stage 4 chronic kidney disease, without long-term current use of insulin (HCC)    Hgb A1C is stable at 7.2.   Continue present medications      Relevant Medications   glimepiride (AMARYL) 2 MG tablet   pioglitazone (ACTOS) 30 MG tablet   rosuvastatin (CRESTOR) 5 MG tablet   CKD (chronic kidney disease) stage 4, GFR 15-29 ml/min (HCC)    Per nephrology       Other Visit Diagnoses    Essential hypertension        Relevant Medications    aliskiren (TEKTURNA) 300 MG tablet    amLODipine (NORVASC) 10 MG tablet    cloNIDine (CATAPRES - DOSED IN MG/24 HR) 0.3 mg/24hr patch    furosemide (LASIX) 40 MG tablet    hydrALAZINE (APRESOLINE) 50 MG tablet    nebivolol (BYSTOLIC) 10 MG tablet    rosuvastatin (CRESTOR) 5 MG tablet    Need for pneumococcal vaccination        Relevant Orders    Pneumococcal polysaccharide vaccine 23-valent greater than or equal to 2yo subcutaneous/IM (Completed)    Annual physical exam            Follow up plan: Return in about 6 months (around 05/03/2016).

## 2015-11-03 ENCOUNTER — Telehealth: Payer: Self-pay

## 2015-11-03 NOTE — Telephone Encounter (Signed)
Pharmacy called and stated that bystolic is not a preferred drug. They suggest that we change the prescription to atenolol, carvedilol, metoprolol tartrate, or metoprolol succinate. Pharmacy is [facility].

## 2015-11-06 MED ORDER — CARVEDILOL 12.5 MG PO TABS: 12.5000 mg | ORAL_TABLET | Freq: Two times a day (BID) | ORAL | Status: DC

## 2015-11-06 NOTE — Telephone Encounter (Signed)
OK.  Will call in Carvedilol

## 2015-11-13 ENCOUNTER — Telehealth: Payer: Self-pay | Admitting: Unknown Physician Specialty

## 2015-11-13 NOTE — Telephone Encounter (Signed)
Called and spoke to patient. She stated that she got a medication in the mail called carvedilol that she has never taken before. I explained to her that that medication was sent in because we got a fax from her pharmacy stating that the bystolic she used to take was not a preferred drug so Elnita MaxwellCheryl changed it to the carvedilol. Patient states she has never taken bystolic before so she does not know why she got this new medication. I told the patient to just keep taking what she has been taking and I would talk to Hoytheryl when she returned. I let the patient know that Elnita MaxwellCheryl is gone all week so patient is aware that this will be addressed when Elnita MaxwellCheryl returns.

## 2015-11-13 NOTE — Telephone Encounter (Signed)
Pt had questions about some of the medications on her med list and she would like a call back

## 2015-11-13 NOTE — Telephone Encounter (Signed)
Hi Patty Roberts, lets set her up with an appointment to review her medications.  Thanks

## 2015-11-15 NOTE — Telephone Encounter (Signed)
Called and scheduled patient an appointment for 11/27/15 for med review.

## 2015-11-27 ENCOUNTER — Encounter: Payer: Self-pay | Admitting: Unknown Physician Specialty

## 2015-11-27 ENCOUNTER — Ambulatory Visit (INDEPENDENT_AMBULATORY_CARE_PROVIDER_SITE_OTHER): Payer: Medicare Other | Admitting: Unknown Physician Specialty

## 2015-11-27 VITALS — BP 100/68 | HR 99 | Temp 97.7°F | Ht 60.7 in | Wt 228.0 lb

## 2015-11-27 DIAGNOSIS — I129 Hypertensive chronic kidney disease with stage 1 through stage 4 chronic kidney disease, or unspecified chronic kidney disease: Secondary | ICD-10-CM

## 2015-11-27 NOTE — Progress Notes (Signed)
BP 100/68 mmHg  Pulse 99  Temp(Src) 97.7 F (36.5 C)  Ht 5' 0.7" (1.542 m)  Wt 228 lb (103.42 kg)  BMI 43.49 kg/m2  SpO2 97%  LMP  (LMP Unknown)   Subjective:    Patient ID: Patty Roberts, female    DOB: 05/12/1932, 82 y.o.   MRN: [id number]  HPI: Patty Roberts is a 82 y.o. female  Chief Complaint  Patient presents with  . Medication Review    pt is here to go over medications because she was sent a medication that she has never taken   Hypertension Pt was given Caredilol to replace Bystolic but not taking Bystolic and hasn't taken it/  BPmanagement through nephrology  Relevant past medical, surgical, family and social history reviewed and updated as indicated. Interim medical history since our last visit reviewed. Allergies and medications reviewed and updated.  Review of Systems  Per HPI unless specifically indicated above     Objective:    BP 100/68 mmHg  Pulse 99  Temp(Src) 97.7 F (36.5 C)  Ht 5' 0.7" (1.542 m)  Wt 228 lb (103.42 kg)  BMI 43.49 kg/m2  SpO2 97%  LMP  (LMP Unknown)  Wt Readings from Last 3 Encounters:  11/27/15 228 lb (103.42 kg)  11/01/15 227 lb 6.4 oz (103.148 kg)  04/12/15 227 lb 9.6 oz (103.239 kg)    Physical Exam  Constitutional: She is oriented to person, place, and time. She appears well-developed and well-nourished. No distress.  HENT:  Head: Normocephalic and atraumatic.  Eyes: Conjunctivae and lids are normal. Right eye exhibits no discharge. Left eye exhibits no discharge. No scleral icterus.  Cardiovascular: Normal rate.   Pulmonary/Chest: Effort normal.  Abdominal: Normal appearance. There is no splenomegaly or hepatomegaly.  Musculoskeletal: Normal range of motion.  Neurological: She is alert and oriented to person, place, and time.  Skin: Skin is intact. No rash noted. No pallor.  Psychiatric: She has a normal mood and affect. Her behavior is normal. Judgment and thought content normal.    Results for orders placed or  performed in visit on 04/12/15  Bayer DCA Hb A1c Waived  Result Value Ref Range   Bayer DCA Hb A1c Waived 6.7 <7.0 %  Comprehensive metabolic panel  Result Value Ref Range   Glucose 132 (H) 65 - 99 mg/dL   BUN 41 (H) 8 - 27 mg/dL   Creatinine, Ser 4.091.93 (H) 0.57 - 1.00 mg/dL   GFR calc non Af Amer 24 (L) >59 mL/min/1.73   GFR calc Af Amer 28 (L) >59 mL/min/1.73   BUN/Creatinine Ratio 21 11 - 26   Sodium 139 134 - 144 mmol/L   Potassium 5.2 3.5 - 5.2 mmol/L   Chloride 99 97 - 108 mmol/L   CO2 24 18 - 29 mmol/L   Calcium 9.2 8.7 - 10.3 mg/dL   Total Protein 7.3 6.0 - 8.5 g/dL   Albumin 4.2 3.5 - 4.7 g/dL   Globulin, Total 3.1 1.5 - 4.5 g/dL   Albumin/Globulin Ratio 1.4 1.1 - 2.5   Bilirubin Total 0.2 0.0 - 1.2 mg/dL   Alkaline Phosphatase 56 39 - 117 IU/L   AST 21 0 - 40 IU/L   ALT 11 0 - 32 IU/L  Lipid Panel Piccolo, Waived  Result Value Ref Range   Cholesterol Piccolo, Waived 154 <200 mg/dL   HDL Chol Piccolo, Waived 57 (L) >59 mg/dL   Triglycerides Piccolo,Waived 253 (H) <150 mg/dL   Chol/HDL Ratio Piccolo,Waive  2.7 mg/dL   LDL Chol Calc Piccolo Waived 47 <100 mg/dL   VLDL Chol Calc Piccolo,Waive 51 (H) <30 mg/dL      Assessment & Plan:   Problem List Items Addressed This Visit      Unprioritized   Hypertensive CKD (chronic kidney disease) - Primary    Reviewed medication.  Told to throw away Carvedilol.  BP management through nephrology          Follow up plan: No Follow-up on file.

## 2015-11-27 NOTE — Assessment & Plan Note (Signed)
Reviewed medication.  Told to throw away Carvedilol.  BP management through nephrology

## 2016-04-19 ENCOUNTER — Other Ambulatory Visit: Payer: Self-pay

## 2016-04-19 MED ORDER — MECLIZINE HCL 25 MG PO TABS: 25.0000 mg | ORAL_TABLET | Freq: Two times a day (BID) | ORAL | 2 refills | Status: DC | PRN

## 2016-05-01 ENCOUNTER — Other Ambulatory Visit: Payer: Self-pay | Admitting: Unknown Physician Specialty

## 2016-05-17 ENCOUNTER — Ambulatory Visit: Payer: Medicare Other | Admitting: Unknown Physician Specialty

## 2016-05-21 ENCOUNTER — Other Ambulatory Visit: Payer: Self-pay | Admitting: Unknown Physician Specialty

## 2016-05-21 NOTE — Telephone Encounter (Signed)
rx

## 2016-06-10 ENCOUNTER — Ambulatory Visit (INDEPENDENT_AMBULATORY_CARE_PROVIDER_SITE_OTHER): Payer: Medicare Other | Admitting: Unknown Physician Specialty

## 2016-06-10 ENCOUNTER — Encounter: Payer: Self-pay | Admitting: Unknown Physician Specialty

## 2016-06-10 VITALS — BP 149/78 | HR 99 | Temp 97.6°F | Ht 60.6 in | Wt 225.2 lb

## 2016-06-10 DIAGNOSIS — E7800 Pure hypercholesterolemia, unspecified: Secondary | ICD-10-CM

## 2016-06-10 DIAGNOSIS — IMO0002 Reserved for concepts with insufficient information to code with codable children: Secondary | ICD-10-CM

## 2016-06-10 DIAGNOSIS — E1122 Type 2 diabetes mellitus with diabetic chronic kidney disease: Secondary | ICD-10-CM

## 2016-06-10 DIAGNOSIS — Z23 Encounter for immunization: Secondary | ICD-10-CM

## 2016-06-10 DIAGNOSIS — I129 Hypertensive chronic kidney disease with stage 1 through stage 4 chronic kidney disease, or unspecified chronic kidney disease: Secondary | ICD-10-CM

## 2016-06-10 DIAGNOSIS — E1165 Type 2 diabetes mellitus with hyperglycemia: Secondary | ICD-10-CM

## 2016-06-10 DIAGNOSIS — N184 Chronic kidney disease, stage 4 (severe): Secondary | ICD-10-CM | POA: Diagnosis not present

## 2016-06-10 MED ORDER — ALISKIREN FUMARATE 300 MG PO TABS: 300.0000 mg | ORAL_TABLET | Freq: Every day | ORAL | 1 refills | Status: DC

## 2016-06-10 MED ORDER — AMLODIPINE BESYLATE 10 MG PO TABS: 10.0000 mg | ORAL_TABLET | Freq: Every day | ORAL | 1 refills | Status: DC

## 2016-06-10 MED ORDER — CLONIDINE HCL 0.3 MG/24HR TD PTWK: 0.3000 mg | MEDICATED_PATCH | TRANSDERMAL | 1 refills | Status: DC

## 2016-06-10 NOTE — Assessment & Plan Note (Signed)
Thinks this was done at Nephrology.  Await labs

## 2016-06-10 NOTE — Assessment & Plan Note (Signed)
Stable.  Good numbers at nephrology.  Awaiting lab results from that office

## 2016-06-10 NOTE — Progress Notes (Signed)
BP (!) 149/78 (BP Location: Right Arm, Patient Position: Standing, Cuff Size: Large)   Pulse 99   Temp 97.6 F (36.4 C)   Ht 5' 0.6" (1.539 m)   Wt 225 lb 3.2 oz (102.2 kg)   LMP  (LMP Unknown)   SpO2 96%   BMI 43.11 kg/m    Subjective:    Patient ID: Patty Roberts, female    DOB: 02/11/1932, 82 y.o.   MRN: [id number]  HPI: Patty Fowlerrma W Spargo is a 82 y.o. female  Chief Complaint  Patient presents with  . Hyperlipidemia  . Hypertension  . Diabetes    pt states last eye exam in chart is correct   . Chronic Kidney Disease   All labs according to pt done by Dr. Thedore MinsSingh  Diabetes: Using medications without difficulties No hypoglycemic episodes No hyperglycemic episodes Feet problems:none Blood Sugars averaging: 100 and some eye exam within last year Last Hgb A1C:With Dr. Thedore MinsSingh  Hypertension  Using medications without difficulty Average home BPs Not checking but good at Nephrology   Using medication without problems or lightheadedness No chest pain with exertion or shortness of breath No Edema  Elevated Cholesterol Using medications without problems No Muscle aches  Diet: Regular diet Exercise:some  Allergies bothering but helped by Allegra  Relevant past medical, surgical, family and social history reviewed and updated as indicated. Interim medical history since our last visit reviewed. Allergies and medications reviewed and updated.  Review of Systems  Gastrointestinal: Positive for constipation.    Per HPI unless specifically indicated above     Objective:    BP (!) 149/78 (BP Location: Right Arm, Patient Position: Standing, Cuff Size: Large)   Pulse 99   Temp 97.6 F (36.4 C)   Ht 5' 0.6" (1.539 m)   Wt 225 lb 3.2 oz (102.2 kg)   LMP  (LMP Unknown)   SpO2 96%   BMI 43.11 kg/m   Wt Readings from Last 3 Encounters:  06/10/16 225 lb 3.2 oz (102.2 kg)  11/27/15 228 lb (103.4 kg)  11/01/15 227 lb 6.4 oz (103.1 kg)    Physical Exam  Constitutional: She is  oriented to person, place, and time. She appears well-developed and well-nourished. No distress.  HENT:  Head: Normocephalic and atraumatic.  Eyes: Conjunctivae and lids are normal. Right eye exhibits no discharge. Left eye exhibits no discharge. No scleral icterus.  Neck: Normal range of motion. Neck supple. No JVD present. Carotid bruit is not present.  Cardiovascular: Normal rate, regular rhythm and normal heart sounds.   Pulmonary/Chest: Effort normal and breath sounds normal.  Abdominal: Normal appearance. There is no splenomegaly or hepatomegaly.  Musculoskeletal: Normal range of motion.  Neurological: She is alert and oriented to person, place, and time.  Skin: Skin is warm, dry and intact. No rash noted. No pallor.  Psychiatric: She has a normal mood and affect. Her behavior is normal. Judgment and thought content normal.         Assessment & Plan:   Problem List Items Addressed This Visit      Unprioritized   Hyperlipidemia    Thinks this was done at Nephrology.  Await labs      Relevant Medications   aliskiren (TEKTURNA) 300 MG tablet   amLODipine (NORVASC) 10 MG tablet   cloNIDine (CATAPRES - DOSED IN MG/24 HR) 0.3 mg/24hr patch   Hypertensive CKD (chronic kidney disease)    Stable.  Good numbers at nephrology.  Awaiting lab results from that  office      Uncontrolled type 2 diabetes mellitus with stage 4 chronic kidney disease, without long-term current use of insulin (HCC)    She thinks Hgb A1C was drawn at Nephrology.  Awaiting labs       Other Visit Diagnoses    Need for influenza vaccination    -  Primary   Relevant Orders   Flu vaccine HIGH DOSE PF (Completed)       Follow up plan: Return in about 3 months (around 09/10/2016).

## 2016-06-10 NOTE — Assessment & Plan Note (Signed)
She thinks Hgb A1C was drawn at Nephrology.  Awaiting labs

## 2016-06-10 NOTE — Patient Instructions (Addendum)

## 2016-06-17 ENCOUNTER — Telehealth: Payer: Self-pay | Admitting: Unknown Physician Specialty

## 2016-06-17 MED ORDER — ONDANSETRON 8 MG PO TBDP: 8.0000 mg | ORAL_TABLET | Freq: Three times a day (TID) | ORAL | 0 refills | Status: DC | PRN

## 2016-06-17 NOTE — Telephone Encounter (Signed)
Pt called would like to know if Elnita MaxwellCheryl can prescribe Ondansetronorally 8mg  for nausea. Pharm is [facility] on [address] in [place]. Thanks.

## 2016-06-17 NOTE — Telephone Encounter (Signed)
Patient notified that rx was sent.

## 2016-06-17 NOTE — Telephone Encounter (Signed)
Routing to provider  

## 2016-06-17 NOTE — Telephone Encounter (Signed)
Done

## 2016-06-19 ENCOUNTER — Ambulatory Visit
Admission: RE | Admit: 2016-06-19 | Discharge: 2016-06-19 | Disposition: A | Discharge status: Home / Self Care | Payer: Medicare Other | Source: Ambulatory Visit | Attending: Nephrology | Admitting: Nephrology

## 2016-06-19 ENCOUNTER — Other Ambulatory Visit: Payer: Self-pay | Admitting: Nephrology

## 2016-06-19 ENCOUNTER — Telehealth: Payer: Self-pay | Admitting: Unknown Physician Specialty

## 2016-06-19 DIAGNOSIS — Z950 Presence of cardiac pacemaker: Secondary | ICD-10-CM | POA: Insufficient documentation

## 2016-06-19 DIAGNOSIS — R059 Cough, unspecified: Secondary | ICD-10-CM

## 2016-06-19 DIAGNOSIS — M4854XA Collapsed vertebra, not elsewhere classified, thoracic region, initial encounter for fracture: Secondary | ICD-10-CM | POA: Diagnosis not present

## 2016-06-19 DIAGNOSIS — R05 Cough: Secondary | ICD-10-CM | POA: Diagnosis present

## 2016-06-19 DIAGNOSIS — I700 Atherosclerosis of aorta: Secondary | ICD-10-CM | POA: Diagnosis not present

## 2016-06-19 DIAGNOSIS — J3489 Other specified disorders of nose and nasal sinuses: Secondary | ICD-10-CM

## 2016-06-19 DIAGNOSIS — R0981 Nasal congestion: Secondary | ICD-10-CM | POA: Insufficient documentation

## 2016-06-19 DIAGNOSIS — M4856XA Collapsed vertebra, not elsewhere classified, lumbar region, initial encounter for fracture: Secondary | ICD-10-CM | POA: Insufficient documentation

## 2016-06-19 NOTE — Telephone Encounter (Signed)
Patient is scheduled for tomorrow, she needs a new meter and strips.

## 2016-06-20 ENCOUNTER — Ambulatory Visit: Payer: Medicare Other | Admitting: Family Medicine

## 2016-07-23 ENCOUNTER — Telehealth: Payer: Self-pay | Admitting: Unknown Physician Specialty

## 2016-07-23 NOTE — Telephone Encounter (Signed)
Called and left patient a VM letting patient know what Elnita MaxwellCheryl said about stool softeners.

## 2016-07-23 NOTE — Telephone Encounter (Signed)
Pt would like to get a stool softener sent to [facility] [address]

## 2016-07-23 NOTE — Telephone Encounter (Signed)
These are OTC, like colace or Miralax

## 2016-07-23 NOTE — Telephone Encounter (Signed)
Routing to provider  

## 2016-07-28 ENCOUNTER — Other Ambulatory Visit: Payer: Self-pay | Admitting: Unknown Physician Specialty

## 2016-08-12 ENCOUNTER — Telehealth: Payer: Self-pay | Admitting: Unknown Physician Specialty

## 2016-08-12 NOTE — Telephone Encounter (Signed)
Routing to provider for advice.

## 2016-08-12 NOTE — Telephone Encounter (Signed)
We need to know what that blood sugar is.  Plus, I would rather DC a medication than add additional food

## 2016-08-12 NOTE — Telephone Encounter (Signed)
Patient is having difficulty having BM and also feels her sugar is dropping in the afternoon, no record of what the level is per patient. She is wanting to know if there is something she can eat in the evenings to help keep her sugar levels up.   Please advise.  Thank you [phone]

## 2016-08-12 NOTE — Telephone Encounter (Signed)
Called and spoke to patient. She stated that she has not been checking her sugar because her meter broke. I explained to patient that Elnita MaxwellCheryl really needs to know how her sugars have been running so she can determine where to go from here. Patient stated that she would have her daughter go get a meter so she could check her sugars again. I asked for her to check them for a few days and then call us back and let us know how they are running.

## 2016-08-14 ENCOUNTER — Telehealth: Payer: Self-pay | Admitting: Unknown Physician Specialty

## 2016-08-14 NOTE — Telephone Encounter (Signed)
Called and spoke to patient. She stated that her sugar Monday evening around 4:30 was 226, at bedtime on Monday around 10:30 is was 154. Tuesday morning before breakfast at 6:01 is was 101 and after dinner on Tuesday around 5:30 is was 83. Patient stated she had a snack at 10:31 pm and sugar was 126. This morning before breakfast it was 137. Patient wants to know if her sugars are supposed to flucuate like this.

## 2016-08-14 NOTE — Telephone Encounter (Signed)
Patient called stating she was supposed to call you to discuss her sugar levels today.  Thank you  Clydie BraunKaren  [phone]

## 2016-08-14 NOTE — Telephone Encounter (Signed)
Blood sugars can fluctuate like that depending on diet, but I don't hear anything that is too low.  Make sure she limits simple carbohydrates like cereal, bread, pasta and rice and eats meals that have some protein.

## 2016-08-14 NOTE — Telephone Encounter (Signed)
Called and let patient know that Patty Roberts said.

## 2016-08-16 ENCOUNTER — Other Ambulatory Visit: Payer: Self-pay

## 2016-08-16 MED ORDER — ONDANSETRON 8 MG PO TBDP: 8.0000 mg | ORAL_TABLET | Freq: Three times a day (TID) | ORAL | 0 refills | Status: DC | PRN

## 2016-08-19 ENCOUNTER — Telehealth: Payer: Self-pay | Admitting: Unknown Physician Specialty

## 2016-08-19 MED ORDER — AZITHROMYCIN 250 MG PO TABS: ORAL_TABLET | ORAL | 0 refills | Status: DC

## 2016-08-19 MED ORDER — BENZONATATE 100 MG PO CAPS: 100.0000 mg | ORAL_CAPSULE | Freq: Two times a day (BID) | ORAL | 0 refills | Status: DC | PRN

## 2016-08-19 NOTE — Telephone Encounter (Signed)
Called and let patient know that medications were sent in for her.

## 2016-08-19 NOTE — Telephone Encounter (Signed)
Routing to provider  

## 2016-09-10 ENCOUNTER — Ambulatory Visit: Payer: Medicare Other | Admitting: Unknown Physician Specialty

## 2016-09-17 ENCOUNTER — Ambulatory Visit (INDEPENDENT_AMBULATORY_CARE_PROVIDER_SITE_OTHER): Payer: Medicare Other | Admitting: Unknown Physician Specialty

## 2016-09-17 ENCOUNTER — Encounter: Payer: Self-pay | Admitting: Unknown Physician Specialty

## 2016-09-17 VITALS — BP 155/73 | HR 108 | Temp 98.0°F | Ht 61.2 in | Wt 219.4 lb

## 2016-09-17 DIAGNOSIS — I129 Hypertensive chronic kidney disease with stage 1 through stage 4 chronic kidney disease, or unspecified chronic kidney disease: Secondary | ICD-10-CM | POA: Diagnosis not present

## 2016-09-17 DIAGNOSIS — E7800 Pure hypercholesterolemia, unspecified: Secondary | ICD-10-CM | POA: Diagnosis not present

## 2016-09-17 DIAGNOSIS — E1165 Type 2 diabetes mellitus with hyperglycemia: Secondary | ICD-10-CM

## 2016-09-17 DIAGNOSIS — N184 Chronic kidney disease, stage 4 (severe): Secondary | ICD-10-CM

## 2016-09-17 DIAGNOSIS — E1122 Type 2 diabetes mellitus with diabetic chronic kidney disease: Secondary | ICD-10-CM | POA: Diagnosis not present

## 2016-09-17 DIAGNOSIS — IMO0002 Reserved for concepts with insufficient information to code with codable children: Secondary | ICD-10-CM

## 2016-09-17 NOTE — Progress Notes (Signed)
BP (!) 155/73 (BP Location: Right Arm, Patient Position: Sitting, Cuff Size: Large)   Pulse (!) 108   Temp 98 F (36.7 C)   Ht 5' 1.2" (1.554 m)   Wt 219 lb 6.4 oz (99.5 kg)   LMP  (LMP Unknown)   SpO2 91%   BMI 41.18 kg/m    Subjective:    Patient ID: Patty Roberts, female    DOB: 01/01/1932, 83 y.o.   MRN: [id number]  HPI: Patty Roberts is a 83 y.o. female  Chief Complaint  Patient presents with  . Diabetes    pt states she is planning to schedule eye exam in March   . Hyperlipidemia  . Hypertension   Diabetes: Using medications without difficulties.  However is feeling her blood sugar is "going up and down."  However it is around 160 fasting and less than 250 after eating.  States she often feels hot and nauseated after eating.  Diet review showed this was after eating a carb heavy diet.   No hypoglycemic episodes No hyperglycemic episodes Feet problems:none eye exam within last year Last Hgb A1C: 6.8  Hypertension  Using medications without difficulty Average home BPs   Using medication without problems or lightheadedness No chest pain with exertion or shortness of breath No Edema  Elevated Cholesterol Using medications without problems No Muscle aches  Diet/Exercise: Having a lot of questions  Allergies Taking Allegra for allergies and taking Allegra but irregularly.  "eyes run water all the time."   Relevant past medical, surgical, family and social history reviewed and updated as indicated. Interim medical history since our last visit reviewed. Allergies and medications reviewed and updated.  Review of Systems  Per HPI unless specifically indicated above     Objective:    BP (!) 155/73 (BP Location: Right Arm, Patient Position: Sitting, Cuff Size: Large)   Pulse (!) 108   Temp 98 F (36.7 C)   Ht 5' 1.2" (1.554 m)   Wt 219 lb 6.4 oz (99.5 kg)   LMP  (LMP Unknown)   SpO2 91%   BMI 41.18 kg/m   Wt Readings from Last 3 Encounters:  09/17/16 219  lb 6.4 oz (99.5 kg)  06/10/16 225 lb 3.2 oz (102.2 kg)  11/27/15 228 lb (103.4 kg)    Physical Exam  Constitutional: She is oriented to person, place, and time. She appears well-developed and well-nourished. No distress.  HENT:  Head: Normocephalic and atraumatic.  Eyes: Conjunctivae and lids are normal. Right eye exhibits no discharge. Left eye exhibits no discharge. No scleral icterus.  Neck: Normal range of motion. Neck supple. No JVD present. Carotid bruit is not present.  Cardiovascular: Normal rate, regular rhythm and normal heart sounds.   Pulmonary/Chest: Effort normal and breath sounds normal.  Abdominal: Normal appearance. There is no splenomegaly or hepatomegaly.  Musculoskeletal: Normal range of motion.  Neurological: She is alert and oriented to person, place, and time.  Skin: Skin is warm, dry and intact. No rash noted. No pallor.  Psychiatric: She has a normal mood and affect. Her behavior is normal. Judgment and thought content normal.      Assessment & Plan:   Problem List Items Addressed This Visit      Unprioritized   Controlled type 2 diabetes mellitus with chronic kidney disease (HCC) - Primary    Hgb A1C is 6.2.  Stop Glimepiride and refer to the lifestyle center.        Hyperlipidemia   Relevant  Orders   Lipid Panel w/o Chol/HDL Ratio   Hypertensive CKD (chronic kidney disease)    A little high.  Seeing Dr. Thedore Mins tomorrow      Relevant Orders   Comprehensive metabolic panel       Follow up plan: Return in about 3 months (around 12/16/2016).

## 2016-09-17 NOTE — Assessment & Plan Note (Signed)
A little high.  Seeing Dr. Thedore MinsSingh tomorrow

## 2016-09-17 NOTE — Assessment & Plan Note (Signed)
Hgb A1C is 6.2.  Stop Glimepiride and refer to the lifestyle center.

## 2016-09-17 NOTE — Patient Instructions (Addendum)
Diabetes Mellitus and Exercise  Exercising regularly is important for your overall health, especially when you have diabetes (diabetes mellitus). Exercising is not only about losing weight. It has many health benefits, such as increasing muscle strength and bone density and reducing body fat and stress. This leads to improved fitness, flexibility, and endurance, all of which result in better overall health.  Exercise has additional benefits for people with diabetes, including:  · Reducing appetite.  · Helping to lower and control blood glucose.  · Lowering blood pressure.  · Helping to control amounts of fatty substances (lipids) in the blood, such as cholesterol and triglycerides.  · Helping the body to respond better to insulin (improving insulin sensitivity).  · Reducing how much insulin the body needs.  · Decreasing the risk for heart disease by:  ? Lowering cholesterol and triglyceride levels.  ? Increasing the levels of good cholesterol.  ? Lowering blood glucose levels.    What is my activity plan?  Your health care provider or certified diabetes educator can help you make a plan for the type and frequency of exercise (activity plan) that works for you. Make sure that you:  · Do at least 150 minutes of moderate-intensity or vigorous-intensity exercise each week. This could be brisk walking, biking, or water aerobics.  ? Do stretching and strength exercises, such as yoga or weightlifting, at least 2 times a week.  ? Spread out your activity over at least 3 days of the week.  · Get some form of physical activity every day.  ? Do not go more than 2 days in a row without some kind of physical activity.  ? Avoid being inactive for more than 90 minutes at a time. Take frequent breaks to walk or stretch.  · Choose a type of exercise or activity that you enjoy, and set realistic goals.  · Start slowly, and gradually increase the intensity of your exercise over time.    What do I need to know about managing my  diabetes?  · Check your blood glucose before and after exercising.  ? If your blood glucose is higher than 240 mg/dL (13.3 mmol/L) before you exercise, check your urine for ketones. If you have ketones in your urine, do not exercise until your blood glucose returns to normal.  · Know the symptoms of low blood glucose (hypoglycemia) and how to treat it. Your risk for hypoglycemia increases during and after exercise. Common symptoms of hypoglycemia can include:  ? Hunger.  ? Anxiety.  ? Sweating and feeling clammy.  ? Confusion.  ? Dizziness or feeling light-headed.  ? Increased heart rate or palpitations.  ? Blurry vision.  ? Tingling or numbness around the mouth, lips, or tongue.  ? Tremors or shakes.  ? Irritability.  · Keep a rapid-acting carbohydrate snack available before, during, and after exercise to help prevent or treat hypoglycemia.  · Avoid injecting insulin into areas of the body that are going to be exercised. For example, avoid injecting insulin into:  ? The arms, when playing tennis.  ? The legs, when jogging.  · Keep records of your exercise habits. Doing this can help you and your health care provider adjust your diabetes management plan as needed. Write down:  ? Food that you eat before and after you exercise.  ? Blood glucose levels before and after you exercise.  ? The type and amount of exercise you have done.  ? When your insulin is expected to peak, if you   use insulin. Avoid exercising at times when your insulin is peaking.  · When you start a new exercise or activity, work with your health care provider to make sure the activity is safe for you, and to adjust your insulin, medicines, or food intake as needed.  · Drink plenty of water while you exercise to prevent dehydration or heat stroke. Drink enough fluid to keep your urine clear or pale yellow.  This information is not intended to replace advice given to you by your health care provider. Make sure you discuss any questions you have with  your health care provider.  Document Released: 11/09/2003 Document Revised: 03/08/2016 Document Reviewed: 01/29/2016  Elsevier Interactive Patient Education © 2017 Elsevier Inc.       Diabetes Mellitus and Food  It is important for you to manage your blood sugar (glucose) level. Your blood glucose level can be greatly affected by what you eat. Eating healthier foods in the appropriate amounts throughout the day at about the same time each day will help you control your blood glucose level. It can also help slow or prevent worsening of your diabetes mellitus. Healthy eating may even help you improve the level of your blood pressure and reach or maintain a healthy weight.  General recommendations for healthful eating and cooking habits include:  · Eating meals and snacks regularly. Avoid going long periods of time without eating to lose weight.  · Eating a diet that consists mainly of plant-based foods, such as fruits, vegetables, nuts, legumes, and whole grains.  · Using low-heat cooking methods, such as baking, instead of high-heat cooking methods, such as deep frying.    Work with your dietitian to make sure you understand how to use the Nutrition Facts information on food labels.  How can food affect me?  Carbohydrates   Carbohydrates affect your blood glucose level more than any other type of food. Your dietitian will help you determine how many carbohydrates to eat at each meal and teach you how to count carbohydrates. Counting carbohydrates is important to keep your blood glucose at a healthy level, especially if you are using insulin or taking certain medicines for diabetes mellitus.  Alcohol   Alcohol can cause sudden decreases in blood glucose (hypoglycemia), especially if you use insulin or take certain medicines for diabetes mellitus. Hypoglycemia can be a life-threatening condition. Symptoms of hypoglycemia (sleepiness, dizziness, and disorientation) are similar to symptoms of having too much alcohol.  If  your health care provider has given you approval to drink alcohol, do so in moderation and use the following guidelines:  · Women should not have more than one drink per day, and men should not have more than two drinks per day. One drink is equal to:  ? 12 oz of beer.  ? 5 oz of wine.  ? 1½ oz of hard liquor.  · Do not drink on an empty stomach.  · Keep yourself hydrated. Have water, diet soda, or unsweetened iced tea.  · Regular soda, juice, and other mixers might contain a lot of carbohydrates and should be counted.    What foods are not recommended?  As you make food choices, it is important to remember that all foods are not the same. Some foods have fewer nutrients per serving than other foods, even though they might have the same number of calories or carbohydrates. It is difficult to get your body what it needs when you eat foods with fewer nutrients. Examples of foods that you   should avoid that are high in calories and carbohydrates but low in nutrients include:  · Trans fats (most processed foods list trans fats on the Nutrition Facts label).  · Regular soda.  · Juice.  · Candy.  · Sweets, such as cake, pie, doughnuts, and cookies.  · Fried foods.    What foods can I eat?  Eat nutrient-rich foods, which will nourish your body and keep you healthy. The food you should eat also will depend on several factors, including:  · The calories you need.  · The medicines you take.  · Your weight.  · Your blood glucose level.  · Your blood pressure level.  · Your cholesterol level.    You should eat a variety of foods, including:  · Protein.  ? Lean cuts of meat.  ? Proteins low in saturated fats, such as fish, egg whites, and beans. Avoid processed meats.  · Fruits and vegetables.  ? Fruits and vegetables that may help control blood glucose levels, such as apples, mangoes, and yams.  · Dairy products.  ? Choose fat-free or low-fat dairy products, such as milk, yogurt, and cheese.  · Grains, bread, pasta, and  rice.  ? Choose whole grain products, such as multigrain bread, whole oats, and brown rice. These foods may help control blood pressure.  · Fats.  ? Foods containing healthful fats, such as nuts, avocado, olive oil, canola oil, and fish.    Does everyone with diabetes mellitus have the same meal plan?  Because every person with diabetes mellitus is different, there is not one meal plan that works for everyone. It is very important that you meet with a dietitian who will help you create a meal plan that is just right for you.  This information is not intended to replace advice given to you by your health care provider. Make sure you discuss any questions you have with your health care provider.  Document Released: 05/16/2005 Document Revised: 01/25/2016 Document Reviewed: 07/16/2013  Elsevier Interactive Patient Education © 2017 Elsevier Inc.

## 2016-09-18 ENCOUNTER — Ambulatory Visit: Payer: Self-pay | Admitting: Unknown Physician Specialty

## 2016-09-18 LAB — COMPREHENSIVE METABOLIC PANEL
A/G RATIO: 1.1 — AB (ref 1.2–2.2)
ALK PHOS: 54 IU/L (ref 39–117)
ALT: 10 IU/L (ref 0–32)
AST: 23 IU/L (ref 0–40)
Albumin: 4.2 g/dL (ref 3.5–4.7)
BUN/Creatinine Ratio: 23 (ref 12–28)
BUN: 49 mg/dL — ABNORMAL HIGH (ref 8–27)
Bilirubin Total: 0.2 mg/dL (ref 0.0–1.2)
CALCIUM: 9.5 mg/dL (ref 8.7–10.3)
CO2: 27 mmol/L (ref 18–29)
CREATININE: 2.11 mg/dL — AB (ref 0.57–1.00)
Chloride: 97 mmol/L (ref 96–106)
GFR calc Af Amer: 25 mL/min/{1.73_m2} — ABNORMAL LOW (ref >59)
GFR calc non Af Amer: 21 mL/min/{1.73_m2} — ABNORMAL LOW (ref >59)
GLOBULIN, TOTAL: 3.9 g/dL (ref 1.5–4.5)
Glucose: 165 mg/dL — ABNORMAL HIGH (ref 65–99)
POTASSIUM: 4.9 mmol/L (ref 3.5–5.2)
SODIUM: 139 mmol/L (ref 134–144)
Total Protein: 8.1 g/dL (ref 6.0–8.5)

## 2016-09-18 LAB — LIPID PANEL W/O CHOL/HDL RATIO
CHOLESTEROL TOTAL: 199 mg/dL (ref 100–199)
HDL: 58 mg/dL (ref >39)
LDL Calculated: 110 mg/dL — ABNORMAL HIGH (ref 0–99)
TRIGLYCERIDES: 154 mg/dL — AB (ref 0–149)
VLDL Cholesterol Cal: 31 mg/dL (ref 5–40)

## 2016-09-18 LAB — BAYER DCA HB A1C WAIVED: HB A1C (BAYER DCA - WAIVED): 6.2 % (ref <7.0)

## 2016-09-20 ENCOUNTER — Telehealth: Payer: Self-pay

## 2016-09-20 ENCOUNTER — Encounter: Payer: Self-pay | Admitting: Unknown Physician Specialty

## 2016-09-20 NOTE — Telephone Encounter (Signed)
Received a fax from the call center stating that patient states that she thinks the provider was going to send in a prescription for allegra at her appointment 09/17/16. Elnita MaxwellCheryl, were you going to send in Allegra for the patient?

## 2016-09-20 NOTE — Telephone Encounter (Signed)
Called patient and let her know what Patty Roberts said. She stated that she would just get this medication at the pharmacy.

## 2016-09-20 NOTE — Telephone Encounter (Signed)
This is usually OTC and not covered by insurance

## 2016-09-26 ENCOUNTER — Other Ambulatory Visit: Payer: Self-pay | Admitting: Unknown Physician Specialty

## 2016-09-26 NOTE — Telephone Encounter (Signed)
Routing to provider  

## 2016-09-26 NOTE — Telephone Encounter (Signed)
Patient needs Patty Roberts to fax in her scripts for the following Omeprazole 20mg   Meclizine 25mg   [facility]  Thanks

## 2016-09-27 MED ORDER — OMEPRAZOLE 20 MG PO CPDR: 20.0000 mg | DELAYED_RELEASE_CAPSULE | ORAL | 3 refills | Status: DC

## 2016-09-27 MED ORDER — MECLIZINE HCL 25 MG PO TABS: 25.0000 mg | ORAL_TABLET | Freq: Two times a day (BID) | ORAL | 2 refills | Status: DC | PRN

## 2016-09-30 ENCOUNTER — Other Ambulatory Visit: Payer: Self-pay

## 2016-09-30 MED ORDER — CLONIDINE HCL 0.3 MG/24HR TD PTWK: 0.3000 mg | MEDICATED_PATCH | TRANSDERMAL | 1 refills | Status: DC

## 2016-10-03 ENCOUNTER — Other Ambulatory Visit: Payer: Self-pay | Admitting: Unknown Physician Specialty

## 2016-10-03 NOTE — Telephone Encounter (Signed)
Routing to provider  

## 2016-10-07 ENCOUNTER — Telehealth: Payer: Self-pay | Admitting: Unknown Physician Specialty

## 2016-10-07 MED ORDER — AMOXICILLIN 875 MG PO TABS: 875.0000 mg | ORAL_TABLET | Freq: Two times a day (BID) | ORAL | 0 refills | Status: DC

## 2016-10-07 NOTE — Telephone Encounter (Signed)
I will call in Amoxil

## 2016-10-07 NOTE — Telephone Encounter (Signed)
Patient notified that medication was sent in.

## 2016-10-07 NOTE — Telephone Encounter (Signed)
Called and spoke to patient. Patient states that her nose is stopped up, congestion, thick phlegm, and sinus pressure. States this started 2 to 3 days ago, and that its not to bad but doesn't want to come in if she doesn't have to. Pharmacy is [facility] [address].

## 2016-11-18 ENCOUNTER — Ambulatory Visit: Payer: Medicare Other | Admitting: *Deleted

## 2016-11-27 ENCOUNTER — Other Ambulatory Visit: Payer: Self-pay | Admitting: Unknown Physician Specialty

## 2016-11-29 ENCOUNTER — Other Ambulatory Visit: Payer: Self-pay | Admitting: Unknown Physician Specialty

## 2016-12-03 LAB — HM DIABETES EYE EXAM

## 2016-12-17 ENCOUNTER — Ambulatory Visit: Payer: Medicare Other | Admitting: Unknown Physician Specialty

## 2016-12-27 ENCOUNTER — Ambulatory Visit (INDEPENDENT_AMBULATORY_CARE_PROVIDER_SITE_OTHER): Payer: Medicare Other | Admitting: Unknown Physician Specialty

## 2016-12-27 ENCOUNTER — Encounter: Payer: Self-pay | Admitting: Unknown Physician Specialty

## 2016-12-27 ENCOUNTER — Telehealth: Payer: Self-pay

## 2016-12-27 VITALS — BP 138/80 | HR 106 | Temp 98.7°F | Wt 218.6 lb

## 2016-12-27 DIAGNOSIS — N184 Chronic kidney disease, stage 4 (severe): Secondary | ICD-10-CM | POA: Diagnosis not present

## 2016-12-27 DIAGNOSIS — F419 Anxiety disorder, unspecified: Secondary | ICD-10-CM

## 2016-12-27 DIAGNOSIS — I129 Hypertensive chronic kidney disease with stage 1 through stage 4 chronic kidney disease, or unspecified chronic kidney disease: Secondary | ICD-10-CM | POA: Diagnosis not present

## 2016-12-27 DIAGNOSIS — H6123 Impacted cerumen, bilateral: Secondary | ICD-10-CM | POA: Diagnosis not present

## 2016-12-27 DIAGNOSIS — E1122 Type 2 diabetes mellitus with diabetic chronic kidney disease: Secondary | ICD-10-CM

## 2016-12-27 LAB — BAYER DCA HB A1C WAIVED: HB A1C: 9 % — AB (ref <7.0)

## 2016-12-27 MED ORDER — SERTRALINE HCL 50 MG PO TABS: 50.0000 mg | ORAL_TABLET | Freq: Every day | ORAL | 0 refills | Status: DC

## 2016-12-27 MED ORDER — GLIMEPIRIDE 2 MG PO TABS: 2.0000 mg | ORAL_TABLET | Freq: Every day | ORAL | 1 refills | Status: DC

## 2016-12-27 NOTE — Telephone Encounter (Signed)
Patient called in requesting that [facility] and [facility]ertraline, be sent to [facility] instead. Called [facility] and DC both medications, then called both of them into [facility] for the patient.

## 2016-12-27 NOTE — Assessment & Plan Note (Signed)
Discussed using mineral oil to soften wax

## 2016-12-27 NOTE — Assessment & Plan Note (Signed)
Normal BP today.  Needs to have it taken standing

## 2016-12-27 NOTE — Assessment & Plan Note (Signed)
Not controlled today at 9%.  Will restart Glimepiride 2 mg.  And recheck in 3 months

## 2016-12-27 NOTE — Assessment & Plan Note (Signed)
Discussed increased activity vs daily Sertraline.  Pt is choosing Sertraline.  Start 50 mg in the AM

## 2016-12-27 NOTE — Patient Instructions (Signed)
May use cotton ball soaked in mineral oil into ear 10-20 min per week if having recurrent ear wax accumulation. May use dilute hydrogen peroxide (equal parts this and water) and place a few drops into ear every 2 weeks to help break down wax buildup.     

## 2016-12-27 NOTE — Progress Notes (Signed)
BP 138/80 (BP Location: Right Arm, Cuff Size: Large)   Pulse (!) 106   Temp 98.7 F (37.1 C)   Wt 218 lb 9.6 oz (99.2 kg)   LMP  (LMP Unknown)   SpO2 91%   BMI 41.03 kg/m    Subjective:    Patient ID: Patty Roberts, female    DOB: 08/24/1932, 83 y.o.   MRN: [id number]  HPI: Patty Roberts is a 83 y.o. female  Chief Complaint  Patient presents with  . Diabetes    pt states she had eye exam last month, will fax form to [employer]  . Hyperlipidemia  . Hypertension  . Anxiety    pt states she has been having trouble wth anxiety maybe once per month    Pt is here with her daughter who gives part of the history.    Hypertension Pt is here for f/u of her BP.  It is high today  Anxiety Pt states she is having low energy and also having anxiety.  States she is nervous.  She is not sure how often this is.   Depression screen [facility] 2/9 12/27/2016 12/27/2016 11/01/2015  Decreased Interest 0 0 0  Down, Depressed, Hopeless 1 1 0  PHQ - 2 Score 1 1 0  Altered sleeping 0 - -  Tired, decreased energy 2 - -  Change in appetite 1 - -  Feeling bad or failure about yourself  0 - -  Trouble concentrating 0 - -  Moving slowly or fidgety/restless 0 - -  Suicidal thoughts 0 - -  PHQ-9 Score 4 - -   Diabetes Last visit we discontinued her glimepermide due to sugars up and down and Hgb A1C at the time was 6.2%.  States blood sugar is 120's to 150's in the morning  Relevant past medical, surgical, family and social history reviewed and updated as indicated. Interim medical history since our last visit reviewed. Allergies and medications reviewed and updated.  Review of Systems  HENT:       Left ear stopped up    Per HPI unless specifically indicated above     Objective:    BP 138/80 (BP Location: Right Arm, Cuff Size: Large)   Pulse (!) 106   Temp 98.7 F (37.1 C)   Wt 218 lb 9.6 oz (99.2 kg)   LMP  (LMP Unknown)   SpO2 91%   BMI 41.03 kg/m   Wt Readings from Last 3 Encounters:    12/27/16 218 lb 9.6 oz (99.2 kg)  09/17/16 219 lb 6.4 oz (99.5 kg)  06/10/16 225 lb 3.2 oz (102.2 kg)    Physical Exam  Constitutional: She is oriented to person, place, and time. She appears well-developed and well-nourished. No distress.  HENT:  Head: Normocephalic and atraumatic.  Bilateral ceruman  Eyes: Conjunctivae and lids are normal. Right eye exhibits no discharge. Left eye exhibits no discharge. No scleral icterus.  Neck: Normal range of motion. Neck supple. No JVD present. Carotid bruit is not present.  Cardiovascular: Normal rate, regular rhythm and normal heart sounds.   Pulmonary/Chest: Effort normal and breath sounds normal.  Abdominal: Normal appearance. There is no splenomegaly or hepatomegaly.  Musculoskeletal: Normal range of motion.  Neurological: She is alert and oriented to person, place, and time.  Skin: Skin is warm, dry and intact. No rash noted. No pallor.  Psychiatric: She has a normal mood and affect. Her behavior is normal. Judgment and thought content normal.  Results for orders placed or performed in visit on 09/17/16  Bayer DCA Hb A1c Waived  Result Value Ref Range   Bayer DCA Hb A1c Waived 6.2 <7.0 %  Comprehensive metabolic panel  Result Value Ref Range   Glucose 165 (H) 65 - 99 mg/dL   BUN 49 (H) 8 - 27 mg/dL   Creatinine, Ser 6.96 (H) 0.57 - 1.00 mg/dL   GFR calc non Af Amer 21 (L) >59 mL/min/1.73   GFR calc Af Amer 25 (L) >59 mL/min/1.73   BUN/Creatinine Ratio 23 12 - 28   Sodium 139 134 - 144 mmol/L   Potassium 4.9 3.5 - 5.2 mmol/L   Chloride 97 96 - 106 mmol/L   CO2 27 18 - 29 mmol/L   Calcium 9.5 8.7 - 10.3 mg/dL   Total Protein 8.1 6.0 - 8.5 g/dL   Albumin 4.2 3.5 - 4.7 g/dL   Globulin, Total 3.9 1.5 - 4.5 g/dL   Albumin/Globulin Ratio 1.1 (L) 1.2 - 2.2   Bilirubin Total 0.2 0.0 - 1.2 mg/dL   Alkaline Phosphatase 54 39 - 117 IU/L   AST 23 0 - 40 IU/L   ALT 10 0 - 32 IU/L  Lipid Panel w/o Chol/HDL Ratio  Result Value Ref Range    Cholesterol, Total 199 100 - 199 mg/dL   Triglycerides 295 (H) 0 - 149 mg/dL   HDL 58 >28 mg/dL   VLDL Cholesterol Cal 31 5 - 40 mg/dL   LDL Calculated 413 (H) 0 - 99 mg/dL      Assessment & Plan:   Problem List Items Addressed This Visit      Unprioritized   Anxiety    Discussed increased activity vs daily Sertraline.  Pt is choosing Sertraline.  Start 50 mg in the AM      Relevant Medications   sertraline (ZOLOFT) 50 MG tablet   Ceruminosis, bilateral    Discussed using mineral oil to soften wax      Controlled type 2 diabetes mellitus with chronic kidney disease (HCC) - Primary    Not controlled today at 9%.  Will restart Glimepiride 2 mg.  And recheck in 3 months      Relevant Medications   glimepiride (AMARYL) 2 MG tablet   Other Relevant Orders   Bayer DCA Hb A1c Waived   Hypertensive CKD (chronic kidney disease)    Normal BP today.  Needs to have it taken standing         Written pt ed Follow up plan: Return in about 3 months (around 03/28/2017).

## 2017-01-16 ENCOUNTER — Other Ambulatory Visit: Payer: Self-pay | Admitting: Unknown Physician Specialty

## 2017-02-03 ENCOUNTER — Ambulatory Visit: Payer: Medicare Other | Admitting: Unknown Physician Specialty

## 2017-02-13 ENCOUNTER — Telehealth: Payer: Self-pay | Admitting: Unknown Physician Specialty

## 2017-02-13 NOTE — Telephone Encounter (Signed)
Called pt to schedule Annual Wellness Visit with [facility]  - knb  °

## 2017-03-06 ENCOUNTER — Telehealth: Payer: Self-pay | Admitting: Unknown Physician Specialty

## 2017-03-06 NOTE — Telephone Encounter (Signed)
Called pt to schedule Annual Wellness Visit with [facility]  - knb  °

## 2017-03-20 ENCOUNTER — Other Ambulatory Visit: Payer: Self-pay | Admitting: Unknown Physician Specialty

## 2017-03-21 ENCOUNTER — Ambulatory Visit: Payer: Medicare Other | Admitting: Unknown Physician Specialty

## 2017-03-25 ENCOUNTER — Ambulatory Visit: Payer: Medicare Other | Admitting: Unknown Physician Specialty

## 2017-04-04 ENCOUNTER — Ambulatory Visit (INDEPENDENT_AMBULATORY_CARE_PROVIDER_SITE_OTHER): Payer: Medicare Other | Admitting: Unknown Physician Specialty

## 2017-04-04 ENCOUNTER — Encounter: Payer: Self-pay | Admitting: Unknown Physician Specialty

## 2017-04-04 VITALS — BP 165/64 | HR 87 | Temp 97.8°F | Ht 61.2 in | Wt 219.1 lb

## 2017-04-04 DIAGNOSIS — N184 Chronic kidney disease, stage 4 (severe): Secondary | ICD-10-CM | POA: Diagnosis not present

## 2017-04-04 DIAGNOSIS — I129 Hypertensive chronic kidney disease with stage 1 through stage 4 chronic kidney disease, or unspecified chronic kidney disease: Secondary | ICD-10-CM | POA: Diagnosis not present

## 2017-04-04 DIAGNOSIS — E7800 Pure hypercholesterolemia, unspecified: Secondary | ICD-10-CM

## 2017-04-04 DIAGNOSIS — Z7189 Other specified counseling: Secondary | ICD-10-CM | POA: Insufficient documentation

## 2017-04-04 DIAGNOSIS — E1122 Type 2 diabetes mellitus with diabetic chronic kidney disease: Secondary | ICD-10-CM | POA: Diagnosis not present

## 2017-04-04 LAB — BAYER DCA HB A1C WAIVED: HB A1C (BAYER DCA - WAIVED): 6.7 % (ref <7.0)

## 2017-04-04 NOTE — Assessment & Plan Note (Signed)
Seeing Dr. Thedore MinsSingh.

## 2017-04-04 NOTE — Assessment & Plan Note (Signed)
Hgb A1C 6.7.  Continue present medications.

## 2017-04-04 NOTE — Assessment & Plan Note (Signed)
A voluntary discussion about advance care planning including the explanation and discussion of advance directives was extensively discussed  with the patient.  Explanation about the health care proxy and Living will was reviewed and packet with forms with explanation of how to fill them out was given.  During this discussion, the patient plans to fill out the paperwork required.  Patient was offered a separate Advance Care Planning visit for further assistance with forms.

## 2017-04-04 NOTE — Patient Instructions (Signed)
Tea tree oil

## 2017-04-04 NOTE — Assessment & Plan Note (Signed)
Stable, continue present medications.   

## 2017-04-04 NOTE — Progress Notes (Signed)
BP (!) 165/64 (BP Location: Left Arm, Cuff Size: Normal)   Pulse 87   Temp 97.8 F (36.6 C)   Ht 5' 1.2" (1.554 m)   Wt 219 lb 1.6 oz (99.4 kg)   LMP  (LMP Unknown)   SpO2 95%   BMI 41.13 kg/m    Subjective:    Patient ID: Patty Roberts, female    DOB: 03/15/1932, 83 y.o.   MRN: [id number]  HPI: Patty Roberts is a 83 y.o. female  Chief Complaint  Patient presents with  . Diabetes  . Hyperlipidemia  . Hypertension   Diabetes: Started Glimepirimide last visit.  Using medications without difficulties No hypoglycemic episodes No hyperglycemic episodes Feet problems: none Blood Sugars averaging: eye exam within last year Last Hgb A1C: 9.0  Hypertension  Using medications without difficulty Average home BPs Not checking   Using medication without problems or lightheadedness No chest pain with exertion or shortness of breath No Edema  Elevated Cholesterol Using medications without problems No Muscle aches  Diet: Eating well Exercise: sedentary.    CKD Seeing Dr. Thedore MinsSingh every 6 months   Relevant past medical, surgical, family and social history reviewed and updated as indicated. Interim medical history since our last visit reviewed. Allergies and medications reviewed and updated.  Review of Systems  Per HPI unless specifically indicated above     Objective:    BP (!) 165/64 (BP Location: Left Arm, Cuff Size: Normal)   Pulse 87   Temp 97.8 F (36.6 C)   Ht 5' 1.2" (1.554 m)   Wt 219 lb 1.6 oz (99.4 kg)   LMP  (LMP Unknown)   SpO2 95%   BMI 41.13 kg/m   Wt Readings from Last 3 Encounters:  04/04/17 219 lb 1.6 oz (99.4 kg)  12/27/16 218 lb 9.6 oz (99.2 kg)  09/17/16 219 lb 6.4 oz (99.5 kg)    Physical Exam  Constitutional: She is oriented to person, place, and time. She appears well-developed and well-nourished. No distress.  HENT:  Head: Normocephalic and atraumatic.  Eyes: Conjunctivae and lids are normal. Right eye exhibits no discharge. Left eye  exhibits no discharge. No scleral icterus.  Neck: Normal range of motion. Neck supple. No JVD present. Carotid bruit is not present.  Cardiovascular: Normal rate.  An irregularly irregular rhythm present.  Murmur heard.  Systolic murmur is present with a grade of 3/6  Pulmonary/Chest: Effort normal and breath sounds normal.  Abdominal: Normal appearance. There is no splenomegaly or hepatomegaly.  Musculoskeletal: Normal range of motion.  Neurological: She is alert and oriented to person, place, and time.  Skin: Skin is warm, dry and intact. No rash noted. No pallor.  Psychiatric: She has a normal mood and affect. Her behavior is normal. Judgment and thought content normal.    Results for orders placed or performed in visit on 12/31/16  HM DIABETES EYE EXAM  Result Value Ref Range   HM Diabetic Eye Exam No Retinopathy No Retinopathy      Assessment & Plan:   Problem List Items Addressed This Visit      Unprioritized   Advanced care planning/counseling discussion    A voluntary discussion about advance care planning including the explanation and discussion of advance directives was extensively discussed  with the patient.  Explanation about the health care proxy and Living will was reviewed and packet with forms with explanation of how to fill them out was given.  During this discussion, the patient plans  to fill out the paperwork required.  Patient was offered a separate Advance Care Planning visit for further assistance with forms.         CKD (chronic kidney disease) stage 4, GFR 15-29 ml/min (HCC) - Primary    Seeing Dr. Thedore MinsSingh.        Controlled type 2 diabetes mellitus with chronic kidney disease (HCC)    Hgb A1C 6.7.  Continue present medications.        Relevant Orders   Bayer DCA Hb A1c Waived   Hyperlipidemia    Stable, continue present medications.        Hypertensive CKD (chronic kidney disease)   Relevant Orders   Comprehensive metabolic panel       Follow  up plan: Return in about 6 months (around 10/05/2017).

## 2017-04-05 LAB — COMPREHENSIVE METABOLIC PANEL
ALBUMIN: 4 g/dL (ref 3.5–4.7)
ALT: 14 IU/L (ref 0–32)
AST: 29 IU/L (ref 0–40)
Albumin/Globulin Ratio: 1.2 (ref 1.2–2.2)
Alkaline Phosphatase: 65 IU/L (ref 39–117)
BILIRUBIN TOTAL: 0.2 mg/dL (ref 0.0–1.2)
BUN/Creatinine Ratio: 19 (ref 12–28)
BUN: 35 mg/dL — AB (ref 8–27)
CHLORIDE: 99 mmol/L (ref 96–106)
CO2: 24 mmol/L (ref 20–29)
CREATININE: 1.87 mg/dL — AB (ref 0.57–1.00)
Calcium: 9.1 mg/dL (ref 8.7–10.3)
GFR calc non Af Amer: 25 mL/min/{1.73_m2} — ABNORMAL LOW (ref >59)
GFR, EST AFRICAN AMERICAN: 28 mL/min/{1.73_m2} — AB (ref >59)
GLUCOSE: 88 mg/dL (ref 65–99)
Globulin, Total: 3.4 g/dL (ref 1.5–4.5)
Potassium: 4.6 mmol/L (ref 3.5–5.2)
Sodium: 138 mmol/L (ref 134–144)
TOTAL PROTEIN: 7.4 g/dL (ref 6.0–8.5)

## 2017-04-07 ENCOUNTER — Encounter: Payer: Self-pay | Admitting: Unknown Physician Specialty

## 2017-05-30 LAB — BASIC METABOLIC PANEL
Creatinine: 1.7 — AB (ref <=1.1)
POTASSIUM: 4.5 (ref 3.4–5.3)

## 2017-05-30 LAB — CBC AND DIFFERENTIAL
HCT: 38 (ref 36–46)
Hemoglobin: 11.6 — AB (ref 12.0–16.0)
WBC: 6.5

## 2017-06-02 ENCOUNTER — Other Ambulatory Visit: Payer: Self-pay | Admitting: Unknown Physician Specialty

## 2017-06-11 ENCOUNTER — Other Ambulatory Visit: Payer: Self-pay | Admitting: Unknown Physician Specialty

## 2017-06-11 MED ORDER — ALISKIREN FUMARATE 300 MG PO TABS: 300.0000 mg | ORAL_TABLET | Freq: Every day | ORAL | 1 refills | Status: DC

## 2017-06-11 NOTE — Addendum Note (Signed)
Addended by: Pablo Ledger on: 06/11/2017 09:24 AM   Modules accepted: Orders

## 2017-06-11 NOTE — Telephone Encounter (Signed)
Patient called again to check the status of her request for the short supply of med

## 2017-06-11 NOTE — Telephone Encounter (Signed)
Called and let patient know that her prescription has been sent in for her.  

## 2017-06-11 NOTE — Addendum Note (Signed)
Addended by: Gabriel Cirri on: 06/11/2017 04:21 PM   Modules accepted: Orders

## 2017-06-11 NOTE — Telephone Encounter (Signed)
Routing to provider. Patient requests short supply be sent to [facility] .

## 2017-06-11 NOTE — Telephone Encounter (Signed)
Patient requesting a 14 day supply of [place] prescription to  [facility] on [address].   Please Advise.  Thank you

## 2017-06-13 ENCOUNTER — Other Ambulatory Visit: Payer: Self-pay | Admitting: Unknown Physician Specialty

## 2017-06-27 ENCOUNTER — Telehealth: Payer: Self-pay

## 2017-06-27 NOTE — Telephone Encounter (Signed)
Called to schedule medicare wellness visit with patient, spoke with her daughter Toniann FailWendy. She states she will speak to her and see when she can come in and will call back.

## 2017-07-21 ENCOUNTER — Telehealth: Payer: Self-pay | Admitting: Unknown Physician Specialty

## 2017-07-21 ENCOUNTER — Other Ambulatory Visit: Payer: Self-pay | Admitting: *Deleted

## 2017-07-21 ENCOUNTER — Other Ambulatory Visit: Payer: Self-pay

## 2017-07-21 MED ORDER — PIOGLITAZONE HCL 30 MG PO TABS: 30.0000 mg | ORAL_TABLET | Freq: Every day | ORAL | 1 refills | Status: DC

## 2017-07-21 NOTE — Telephone Encounter (Signed)
Patient last seen 04/04/17 and has f/up 10/14/17.

## 2017-07-21 NOTE — Telephone Encounter (Signed)
Copied from CRM [id number]. Topic: General - Other >> Jul 21, 2017  9:10 AM Leafy Roobinson, Norma J wrote: Reason for CRM: pt needs refill on pioglitazone 30 mg #[facility]. Pt has enough med to last until mail order arrives

## 2017-07-21 NOTE — Telephone Encounter (Signed)
Copied from CRM [id number]. Topic: General - Other >> Jul 21, 2017  9:10 AM Leafy Roobinson, Norma J wrote: Reason for CRM: pt needs refill on pioglitazone 30 mg #[facility]. Pt has enough med to last until mail order arrives / This has already been filled.  Disp Refills Start End   pioglitazone (ACTOS) 30 MG tablet 90 tablet 1 07/21/2017    Sig - Route: Take 1 tablet (30 mg total) daily by mouth. - Oral   Sent to pharmacy as: pioglitazone (ACTOS) 30 MG tablet   E-Prescribing Status: Receipt confirmed by pharmacy (07/21/2017 11:53 AM EST)

## 2017-09-03 ENCOUNTER — Other Ambulatory Visit: Payer: Self-pay | Admitting: Unknown Physician Specialty

## 2017-09-03 NOTE — Telephone Encounter (Signed)
Your patient 

## 2017-10-07 ENCOUNTER — Ambulatory Visit: Payer: Medicare Other | Admitting: Unknown Physician Specialty

## 2017-10-14 ENCOUNTER — Ambulatory Visit: Payer: Medicare Other | Admitting: Unknown Physician Specialty

## 2017-10-20 ENCOUNTER — Ambulatory Visit (INDEPENDENT_AMBULATORY_CARE_PROVIDER_SITE_OTHER): Payer: Medicare Other | Admitting: Unknown Physician Specialty

## 2017-10-20 ENCOUNTER — Encounter: Payer: Self-pay | Admitting: Unknown Physician Specialty

## 2017-10-20 VITALS — BP 156/61 | HR 90 | Temp 98.2°F | Wt 209.4 lb

## 2017-10-20 DIAGNOSIS — I129 Hypertensive chronic kidney disease with stage 1 through stage 4 chronic kidney disease, or unspecified chronic kidney disease: Secondary | ICD-10-CM | POA: Diagnosis not present

## 2017-10-20 DIAGNOSIS — N184 Chronic kidney disease, stage 4 (severe): Secondary | ICD-10-CM

## 2017-10-20 DIAGNOSIS — E1122 Type 2 diabetes mellitus with diabetic chronic kidney disease: Secondary | ICD-10-CM

## 2017-10-20 DIAGNOSIS — E7800 Pure hypercholesterolemia, unspecified: Secondary | ICD-10-CM | POA: Diagnosis not present

## 2017-10-20 LAB — BAYER DCA HB A1C WAIVED: HB A1C (BAYER DCA - WAIVED): 6 % (ref <7.0)

## 2017-10-20 MED ORDER — SERTRALINE HCL 50 MG PO TABS: 50.0000 mg | ORAL_TABLET | Freq: Every day | ORAL | 0 refills | Status: DC

## 2017-10-20 MED ORDER — ONDANSETRON 8 MG PO TBDP: ORAL_TABLET | ORAL | 2 refills | Status: DC

## 2017-10-20 MED ORDER — CARVEDILOL 12.5 MG PO TABS: 12.5000 mg | ORAL_TABLET | Freq: Two times a day (BID) | ORAL | 3 refills | Status: DC

## 2017-10-20 MED ORDER — ROSUVASTATIN CALCIUM 5 MG PO TABS: 5.0000 mg | ORAL_TABLET | Freq: Every day | ORAL | 1 refills | Status: DC

## 2017-10-20 MED ORDER — GLIMEPIRIDE 2 MG PO TABS: ORAL_TABLET | ORAL | 1 refills | Status: DC

## 2017-10-20 MED ORDER — AMLODIPINE BESYLATE 10 MG PO TABS: 10.0000 mg | ORAL_TABLET | Freq: Every day | ORAL | 1 refills | Status: DC

## 2017-10-20 MED ORDER — MECLIZINE HCL 25 MG PO TABS: ORAL_TABLET | ORAL | 2 refills | Status: DC

## 2017-10-20 MED ORDER — OMEPRAZOLE 20 MG PO CPDR: 20.0000 mg | DELAYED_RELEASE_CAPSULE | ORAL | 3 refills | Status: AC

## 2017-10-20 MED ORDER — HYDRALAZINE HCL 50 MG PO TABS: 50.0000 mg | ORAL_TABLET | Freq: Three times a day (TID) | ORAL | 1 refills | Status: DC

## 2017-10-20 MED ORDER — FUROSEMIDE 40 MG PO TABS: 40.0000 mg | ORAL_TABLET | Freq: Every day | ORAL | 1 refills | Status: DC

## 2017-10-20 MED ORDER — CLONIDINE 0.3 MG/24HR TD PTWK: MEDICATED_PATCH | TRANSDERMAL | 1 refills | Status: DC

## 2017-10-20 MED ORDER — ALISKIREN FUMARATE 300 MG PO TABS: 300.0000 mg | ORAL_TABLET | Freq: Every day | ORAL | 1 refills | Status: DC

## 2017-10-20 MED ORDER — PIOGLITAZONE HCL 30 MG PO TABS: 30.0000 mg | ORAL_TABLET | Freq: Every day | ORAL | 1 refills | Status: DC

## 2017-10-20 NOTE — Assessment & Plan Note (Addendum)
Seeing Nephrology.  Check GFR

## 2017-10-20 NOTE — Progress Notes (Signed)
BP (!) 156/61   Pulse 90   Temp 98.2 F (36.8 C) (Oral)   Wt 209 lb 6.4 oz (95 kg)   LMP  (LMP Unknown)   SpO2 94%   BMI 39.31 kg/m    Subjective:    Patient ID: Patty Roberts, female    DOB: April 20, 1932, 84 y.o.   MRN: [id number]  HPI: Patty Roberts is a 84 y.o. female  Chief Complaint  Patient presents with  . Diabetes  . Hyperlipidemia  . Hypertension   Diabetes: Using medications without difficulties No hypoglycemic episodes No hyperglycemic episodes Feet problems: Blood Sugars averaging: 98-100 fasting eye exam within last year Last Hgb A1C: 6.6  Hypertension  Using medications without difficulty Average home BPs Not checking   Using medication without problems or lightheadedness No chest pain with exertion or shortness of breath No Edema  Elevated Cholesterol Using medications without problems No Muscle aches  Diet: "pretty good" Exercise:Walking in the house  Social History   Socioeconomic History  . Marital status: Widowed    Spouse name: Not on file  . Number of children: Not on file  . Years of education: Not on file  . Highest education level: Not on file  Social Needs  . Financial resource strain: Not on file  . Food insecurity - worry: Not on file  . Food insecurity - inability: Not on file  . Transportation needs - medical: Not on file  . Transportation needs - non-medical: Not on file  Occupational History  . Not on file  Tobacco Use  . Smoking status: Never Smoker  . Smokeless tobacco: Never Used  Substance and Sexual Activity  . Alcohol use: No    Alcohol/week: 0.0 oz  . Drug use: No  . Sexual activity: No  Other Topics Concern  . Not on file  Social History Narrative  . Not on file   Family History  Problem Relation Age of Onset  . Hypertension Mother   . Thyroid disease Daughter   . Heart disease Son        heart attack   Past Medical History:  Diagnosis Date  . Chronic kidney disease   . Diabetes mellitus without  complication (HCC)   . GERD (gastroesophageal reflux disease)   . Hyperlipidemia   . Hypertension    Past Surgical History:  Procedure Laterality Date  . ABDOMINAL HYSTERECTOMY    . ANKLE FRACTURE SURGERY    . HERNIA REPAIR    . PACEMAKER INSERTION     x2  . vertebral fix     Relevant past medical, surgical, family and social history reviewed and updated as indicated. Interim medical history since our last visit reviewed. Allergies and medications reviewed and updated.  Review of Systems  Constitutional: Negative.   HENT: Negative.   Eyes: Negative.   Respiratory: Negative.   Cardiovascular: Negative.   Gastrointestinal: Negative.   Musculoskeletal: Negative.   Neurological: Negative.   Psychiatric/Behavioral: Negative.     Per HPI unless specifically indicated above     Objective:    BP (!) 156/61   Pulse 90   Temp 98.2 F (36.8 C) (Oral)   Wt 209 lb 6.4 oz (95 kg)   LMP  (LMP Unknown)   SpO2 94%   BMI 39.31 kg/m   Wt Readings from Last 3 Encounters:  10/20/17 209 lb 6.4 oz (95 kg)  04/04/17 219 lb 1.6 oz (99.4 kg)  12/27/16 218 lb 9.6 oz (99.2 kg)  Physical Exam  Constitutional: She is oriented to person, place, and time. She appears well-developed and well-nourished. No distress.  HENT:  Head: Normocephalic and atraumatic.  Eyes: Conjunctivae and lids are normal. Right eye exhibits no discharge. Left eye exhibits no discharge. No scleral icterus.  Neck: Normal range of motion. Neck supple. No JVD present. Carotid bruit is not present.  Cardiovascular: Normal rate and regular rhythm.  Murmur heard.  Systolic murmur is present with a grade of 3/6. Pulmonary/Chest: Effort normal and breath sounds normal.  Abdominal: Normal appearance. There is no splenomegaly or hepatomegaly.  Musculoskeletal: Normal range of motion.  Neurological: She is alert and oriented to person, place, and time.  Skin: Skin is warm, dry and intact. No rash noted. No pallor.    Psychiatric: She has a normal mood and affect. Her behavior is normal. Judgment and thought content normal.    Results for orders placed or performed in visit on 10/20/17  Bayer DCA Hb A1c Waived  Result Value Ref Range   Bayer DCA Hb A1c Waived 6.0 <7.0 %      Assessment & Plan:   Problem List Items Addressed This Visit      Unprioritized   CKD (chronic kidney disease) stage 4, GFR 15-29 ml/min (HCC) - Primary    Seeing Nephrology.  Check GFR      Relevant Orders   CBC with Differential/Platelet   Controlled type 2 diabetes mellitus with chronic kidney disease (HCC)    Hgb A1C of 6.0.  We tried stopping Glimepiramide at one time but it went up to 9.0.  She will let me know if she gets hypoglycemic.        Relevant Medications   glimepiride (AMARYL) 2 MG tablet   pioglitazone (ACTOS) 30 MG tablet   rosuvastatin (CRESTOR) 5 MG tablet   Other Relevant Orders   Bayer DCA Hb A1c Waived (Completed)   Hyperlipidemia    Stable, continue present medications.        Relevant Medications   cloNIDine (CATAPRES - DOSED IN MG/24 HR) 0.3 mg/24hr patch   amLODipine (NORVASC) 10 MG tablet   carvedilol (COREG) 12.5 MG tablet   aliskiren (TEKTURNA) 300 MG tablet   furosemide (LASIX) 40 MG tablet   rosuvastatin (CRESTOR) 5 MG tablet   hydrALAZINE (APRESOLINE) 50 MG tablet   Hypertensive CKD (chronic kidney disease)   Relevant Orders   Comprehensive metabolic panel       Follow up plan: Return in about 6 months (around 04/19/2018).

## 2017-10-20 NOTE — Assessment & Plan Note (Signed)
Stable, continue present medications.   

## 2017-10-20 NOTE — Assessment & Plan Note (Signed)
Hgb A1C of 6.0.  We tried stopping Glimepiramide at one time but it went up to 9.0.  She will let me know if she gets hypoglycemic.

## 2017-10-21 LAB — COMPREHENSIVE METABOLIC PANEL
ALBUMIN: 4 g/dL (ref 3.5–4.7)
ALK PHOS: 63 IU/L (ref 39–117)
ALT: 8 IU/L (ref 0–32)
AST: 22 IU/L (ref 0–40)
Albumin/Globulin Ratio: 1.1 — ABNORMAL LOW (ref 1.2–2.2)
BILIRUBIN TOTAL: 0.3 mg/dL (ref 0.0–1.2)
BUN / CREAT RATIO: 17 (ref 12–28)
BUN: 47 mg/dL — AB (ref 8–27)
CHLORIDE: 98 mmol/L (ref 96–106)
CO2: 22 mmol/L (ref 20–29)
Calcium: 8.6 mg/dL — ABNORMAL LOW (ref 8.7–10.3)
Creatinine, Ser: 2.75 mg/dL — ABNORMAL HIGH (ref 0.57–1.00)
GFR calc Af Amer: 18 mL/min/{1.73_m2} — ABNORMAL LOW (ref >59)
GFR calc non Af Amer: 15 mL/min/{1.73_m2} — ABNORMAL LOW (ref >59)
GLUCOSE: 153 mg/dL — AB (ref 65–99)
Globulin, Total: 3.7 g/dL (ref 1.5–4.5)
Potassium: 4.4 mmol/L (ref 3.5–5.2)
Sodium: 138 mmol/L (ref 134–144)
Total Protein: 7.7 g/dL (ref 6.0–8.5)

## 2017-10-21 LAB — CBC WITH DIFFERENTIAL/PLATELET
BASOS ABS: 0 10*3/uL (ref 0.0–0.2)
Basos: 1 %
EOS (ABSOLUTE): 0.3 10*3/uL (ref 0.0–0.4)
Eos: 5 %
HEMOGLOBIN: 11.6 g/dL (ref 11.1–15.9)
Hematocrit: 34.8 % (ref 34.0–46.6)
Immature Grans (Abs): 0.1 10*3/uL (ref 0.0–0.1)
Immature Granulocytes: 1 %
LYMPHS ABS: 1.5 10*3/uL (ref 0.7–3.1)
Lymphs: 25 %
MCH: 34.8 pg — ABNORMAL HIGH (ref 26.6–33.0)
MCHC: 33.3 g/dL (ref 31.5–35.7)
MCV: 105 fL — ABNORMAL HIGH (ref 79–97)
MONOCYTES: 11 %
Monocytes Absolute: 0.7 10*3/uL (ref 0.1–0.9)
NEUTROS PCT: 57 %
Neutrophils Absolute: 3.6 10*3/uL (ref 1.4–7.0)
Platelets: 312 10*3/uL (ref 150–379)
RBC: 3.33 x10E6/uL — AB (ref 3.77–5.28)
RDW: 17 % — AB (ref 12.3–15.4)
WBC: 6.2 10*3/uL (ref 3.4–10.8)

## 2017-10-23 ENCOUNTER — Telehealth: Payer: Self-pay | Admitting: Unknown Physician Specialty

## 2017-10-23 NOTE — Telephone Encounter (Signed)
Discussed with daughter Toniann FailWendy  GFR very low at 15.  Will try to move up appt at [facility] for further evaluation

## 2017-10-23 NOTE — Telephone Encounter (Signed)
I have tried calling [facility]. [phone]  It says "the number cannot be completed as dialed. Please check the number or try again later". Something could be going on with the phones.   Will try again later.  FYI to provider and CMA

## 2017-10-24 NOTE — Telephone Encounter (Signed)
Just a reminder to f/u with an appointment for Patty Roberts

## 2017-10-27 NOTE — Telephone Encounter (Signed)
Called [facility]. Patient was seen this morning for office visit.

## 2017-11-03 ENCOUNTER — Ambulatory Visit: Payer: Self-pay

## 2017-11-03 ENCOUNTER — Ambulatory Visit (INDEPENDENT_AMBULATORY_CARE_PROVIDER_SITE_OTHER): Payer: Medicare Other | Admitting: Unknown Physician Specialty

## 2017-11-03 ENCOUNTER — Encounter: Payer: Self-pay | Admitting: Unknown Physician Specialty

## 2017-11-03 VITALS — BP 161/74 | HR 88 | Temp 97.9°F | Wt 219.0 lb

## 2017-11-03 DIAGNOSIS — E162 Hypoglycemia, unspecified: Secondary | ICD-10-CM | POA: Diagnosis not present

## 2017-11-03 DIAGNOSIS — F419 Anxiety disorder, unspecified: Secondary | ICD-10-CM

## 2017-11-03 MED ORDER — BLOOD GLUCOSE METER KIT: PACK | 0 refills | Status: DC

## 2017-11-03 NOTE — Telephone Encounter (Signed)
Routing to provider, FYI. Patient coming in today for appointment.

## 2017-11-03 NOTE — Progress Notes (Signed)
BP (!) 161/74   Pulse 88   Temp 97.9 F (36.6 C) (Oral)   Wt 219 lb (99.3 kg)   LMP  (LMP Unknown)   SpO2 95%   BMI 41.11 kg/m    Subjective:    Patient ID: Patty Roberts, female    DOB: 1931/10/31, 84 y.o.   MRN: [id number]  HPI: Patty Roberts is a 84 y.o. female  Chief Complaint  Patient presents with  . Hypoglycemia    pt states her BP has been dropping, states she feels weak and like she is having anxiety    Hypoglycemia Pt states her blood sugar has been dropping.  BS is 110-115 this AM but 58 last night (1:10).  Chart review shows last Hgb A1C is 6.0.    Glucose was 153.  Hypoglycemia is accompanied by anxiety.    CKD Saw Nephrology for very low GFR but returned back to 29 at their office   Depression screen [facility] 2/9 11/03/2017 12/27/2016 12/27/2016 11/01/2015  Decreased Interest 0 0 0 0  Down, Depressed, Hopeless 0 1 1 0  PHQ - 2 Score 0 1 1 0  Altered sleeping 0 0 - -  Tired, decreased energy 1 2 - -  Change in appetite 1 1 - -  Feeling bad or failure about yourself  0 0 - -  Trouble concentrating 0 0 - -  Moving slowly or fidgety/restless 0 0 - -  Suicidal thoughts 0 0 - -  PHQ-9 Score 2 4 - -     Relevant past medical, surgical, family and social history reviewed and updated as indicated. Interim medical history since our last visit reviewed. Allergies and medications reviewed and updated.  Review of Systems  Constitutional: Negative.   Respiratory: Negative.   Cardiovascular: Negative.   Psychiatric/Behavioral: Negative.     Per HPI unless specifically indicated above     Objective:    BP (!) 161/74   Pulse 88   Temp 97.9 F (36.6 C) (Oral)   Wt 219 lb (99.3 kg)   LMP  (LMP Unknown)   SpO2 95%   BMI 41.11 kg/m   Wt Readings from Last 3 Encounters:  11/03/17 219 lb (99.3 kg)  10/20/17 209 lb 6.4 oz (95 kg)  04/04/17 219 lb 1.6 oz (99.4 kg)    Physical Exam  Constitutional: She is oriented to person, place, and time. She appears well-developed  and well-nourished. No distress.  HENT:  Head: Normocephalic and atraumatic.  Eyes: Conjunctivae and lids are normal. Right eye exhibits no discharge. Left eye exhibits no discharge. No scleral icterus.  Neck: Normal range of motion. Neck supple. No JVD present. Carotid bruit is not present.  Cardiovascular: Normal rate, regular rhythm and normal heart sounds.  Pulmonary/Chest: Effort normal and breath sounds normal.  Abdominal: Normal appearance. There is no splenomegaly or hepatomegaly.  Musculoskeletal: Normal range of motion.  Neurological: She is alert and oriented to person, place, and time.  Skin: Skin is warm, dry and intact. No rash noted. No pallor.  Psychiatric: She has a normal mood and affect. Her behavior is normal. Judgment and thought content normal.    Results for orders placed or performed in visit on 10/20/17  Bayer DCA Hb A1c Waived  Result Value Ref Range   Bayer DCA Hb A1c Waived 6.0 <7.0 %  Comprehensive metabolic panel  Result Value Ref Range   Glucose 153 (H) 65 - 99 mg/dL   BUN 47 (H) 8 -  27 mg/dL   Creatinine, Ser 1.612.75 (H) 0.57 - 1.00 mg/dL   GFR calc non Af Amer 15 (L) >59 mL/min/1.73   GFR calc Af Amer 18 (L) >59 mL/min/1.73   BUN/Creatinine Ratio 17 12 - 28   Sodium 138 134 - 144 mmol/L   Potassium 4.4 3.5 - 5.2 mmol/L   Chloride 98 96 - 106 mmol/L   CO2 22 20 - 29 mmol/L   Calcium 8.6 (L) 8.7 - 10.3 mg/dL   Total Protein 7.7 6.0 - 8.5 g/dL   Albumin 4.0 3.5 - 4.7 g/dL   Globulin, Total 3.7 1.5 - 4.5 g/dL   Albumin/Globulin Ratio 1.1 (L) 1.2 - 2.2   Bilirubin Total 0.3 0.0 - 1.2 mg/dL   Alkaline Phosphatase 63 39 - 117 IU/L   AST 22 0 - 40 IU/L   ALT 8 0 - 32 IU/L  CBC with Differential/Platelet  Result Value Ref Range   WBC 6.2 3.4 - 10.8 x10E3/uL   RBC 3.33 (L) 3.77 - 5.28 x10E6/uL   Hemoglobin 11.6 11.1 - 15.9 g/dL   Hematocrit 09.634.8 04.534.0 - 46.6 %   MCV 105 (H) 79 - 97 fL   MCH 34.8 (H) 26.6 - 33.0 pg   MCHC 33.3 31.5 - 35.7 g/dL   RDW  40.917.0 (H) 81.112.3 - 15.4 %   Platelets 312 150 - 379 x10E3/uL   Neutrophils 57 Not Estab. %   Lymphs 25 Not Estab. %   Monocytes 11 Not Estab. %   Eos 5 Not Estab. %   Basos 1 Not Estab. %   Neutrophils Absolute 3.6 1.4 - 7.0 x10E3/uL   Lymphocytes Absolute 1.5 0.7 - 3.1 x10E3/uL   Monocytes Absolute 0.7 0.1 - 0.9 x10E3/uL   EOS (ABSOLUTE) 0.3 0.0 - 0.4 x10E3/uL   Basophils Absolute 0.0 0.0 - 0.2 x10E3/uL   Immature Granulocytes 1 Not Estab. %   Immature Grans (Abs) 0.1 0.0 - 0.1 x10E3/uL      Assessment & Plan:   Problem List Items Addressed This Visit      Unprioritized   Anxiety    Suspect secondary to hypoglycemia.  No anxiety medications for now.  Will reassess in April       Other Visit Diagnoses    Hypoglycemia    -  Primary   New problem.  Stop Amaryl and Actos.  Check sugar periodically.  Strips given.  Discussed lower carbohydrates in general       Follow up plan: 4 weeks

## 2017-11-03 NOTE — Assessment & Plan Note (Signed)
Suspect secondary to hypoglycemia.  No anxiety medications for now.  Will reassess in April

## 2017-11-03 NOTE — Telephone Encounter (Signed)
Pt.'s daughter states during the night pt. C/o not feeling good - "felt weak."Blood glucose 58. She had OJ, Pepsi and peanut butter crackers. 15 minutes later it was 82.They are currently out of test strips. Pt. Requests an appointment today to talk about her medications. Still feels weak. Instructed to eat a good breakfast with protein. States will have eggs and toast. Reason for Disposition . [1] Blood glucose < 70 mg/dl (3.9 mmol/l) or symptomatic AND [2] cause known  Answer Assessment - Initial Assessment Questions 1. SYMPTOMS: "What symptoms are you concerned about?"     Had low blood sugar at 0115 58 ; then at 0130 82 2. ONSET:  "When did the symptoms start?"     Felt weak 3. BLOOD GLUCOSE: "What is your blood glucose level?"      Out of strips  4. USUAL RANGE: "What is your blood glucose level usually?" (e.g., usual fasting morning value, usual evening value)     Unsure 5. TYPE 1 or 2:  "Do you know what type of diabetes you have?"  (e.g., Type 1, Type 2, Gestational; doesn't know)      Type 6. INSULIN: "Do you take insulin?"      No insulin 7. DIABETES PILLS: "Do you take any pills for your diabetes?"     Yes 8. OTHER SYMPTOMS: "Do you have any symptoms?" (e.g., fever, frequent urination, difficulty breathing, vomiting)     Just weak 9. LOW BLOOD GLUCOSE TREATMENT: "What have you done so far to treat the low blood glucose level?"     Ate peanut crackers, juice 10. ALONE: "Are you alone right now or is someone with you?"        Not alone 11. PREGNANCY: "Is there any chance you are pregnant?" "When was your last menstrual period?"       No  Protocols used: DIABETES - LOW BLOOD SUGAR-A-AH

## 2017-11-27 ENCOUNTER — Other Ambulatory Visit: Payer: Self-pay | Admitting: Unknown Physician Specialty

## 2017-12-03 ENCOUNTER — Other Ambulatory Visit: Payer: Self-pay | Admitting: *Deleted

## 2017-12-03 ENCOUNTER — Telehealth: Payer: Self-pay | Admitting: Unknown Physician Specialty

## 2017-12-03 MED ORDER — ALISKIREN FUMARATE 300 MG PO TABS: 300.0000 mg | ORAL_TABLET | Freq: Every day | ORAL | 1 refills | Status: DC

## 2017-12-03 NOTE — Telephone Encounter (Signed)
Copied from CRM [id number]. Topic: Quick Communication - Rx Refill/Question >> Dec 03, 2017  8:11 AM Gerrianne ScalePayne, Ajeenah Heiny L wrote: Medication: TEKTURNA 300 MG tablet Has the patient contacted their pharmacy? Yes.   (Agent: If no, request that the patient contact the pharmacy for the refill.) Preferred Pharmacy (with phone number or street name):   [facility] [place] [address] [phone] (Phone) [phone] (Fax)     Agent: Please be advised that RX refills may take up to 3 business days. We ask that you follow-up with your pharmacy.

## 2017-12-05 ENCOUNTER — Ambulatory Visit: Payer: Medicare Other | Admitting: Unknown Physician Specialty

## 2017-12-09 MED ORDER — ALISKIREN FUMARATE 300 MG PO TABS: 300.0000 mg | ORAL_TABLET | Freq: Every day | ORAL | 1 refills | Status: DC

## 2017-12-09 NOTE — Telephone Encounter (Signed)
[facility] [phone] reference # [id number]  for 90 day supply of tekturna 300 mg

## 2017-12-13 ENCOUNTER — Other Ambulatory Visit: Payer: Self-pay | Admitting: Unknown Physician Specialty

## 2017-12-19 ENCOUNTER — Ambulatory Visit: Payer: Self-pay | Admitting: *Deleted

## 2017-12-19 ENCOUNTER — Ambulatory Visit (INDEPENDENT_AMBULATORY_CARE_PROVIDER_SITE_OTHER): Payer: Medicare Other | Admitting: Family Medicine

## 2017-12-19 VITALS — BP 166/70 | HR 76 | Temp 97.8°F

## 2017-12-19 DIAGNOSIS — E1122 Type 2 diabetes mellitus with diabetic chronic kidney disease: Secondary | ICD-10-CM | POA: Diagnosis not present

## 2017-12-19 DIAGNOSIS — R062 Wheezing: Secondary | ICD-10-CM

## 2017-12-19 DIAGNOSIS — F419 Anxiety disorder, unspecified: Secondary | ICD-10-CM | POA: Diagnosis not present

## 2017-12-19 DIAGNOSIS — N184 Chronic kidney disease, stage 4 (severe): Secondary | ICD-10-CM | POA: Diagnosis not present

## 2017-12-19 MED ORDER — BENZONATATE 200 MG PO CAPS: 200.0000 mg | ORAL_CAPSULE | Freq: Three times a day (TID) | ORAL | 0 refills | Status: DC | PRN

## 2017-12-19 MED ORDER — AZITHROMYCIN 250 MG PO TABS: ORAL_TABLET | ORAL | 0 refills | Status: DC

## 2017-12-19 MED ORDER — ALBUTEROL SULFATE HFA 108 (90 BASE) MCG/ACT IN AERS: 2.0000 | INHALATION_SPRAY | Freq: Four times a day (QID) | RESPIRATORY_TRACT | 0 refills | Status: DC | PRN

## 2017-12-19 MED ORDER — GLIMEPIRIDE 2 MG PO TABS: ORAL_TABLET | ORAL | 1 refills | Status: DC

## 2017-12-19 NOTE — Patient Instructions (Signed)
Carbohydrate Counting for Diabetes Mellitus, Adult Carbohydrate counting is a method for keeping track of how many carbohydrates you eat. Eating carbohydrates naturally increases the amount of sugar (glucose) in the blood. Counting how many carbohydrates you eat helps keep your blood glucose within normal limits, which helps you manage your diabetes (diabetes mellitus). It is important to know how many carbohydrates you can safely have in each meal. This is different for every person. A diet and nutrition specialist (registered dietitian) can help you make a meal plan and calculate how many carbohydrates you should have at each meal and snack. Carbohydrates are found in the following foods:  Grains, such as breads and cereals.  Dried beans and soy products.  Starchy vegetables, such as potatoes, peas, and corn.  Fruit and fruit juices.  Milk and yogurt.  Sweets and snack foods, such as cake, cookies, candy, chips, and soft drinks.  How do I count carbohydrates? There are two ways to count carbohydrates in food. You can use either of the methods or a combination of both. Reading "Nutrition Facts" on packaged food The "Nutrition Facts" list is included on the labels of almost all packaged foods and beverages in the [place] It includes:  The serving size.  Information about nutrients in each serving, including the grams (g) of carbohydrate per serving.  To use the "Nutrition Facts":  Decide how many servings you will have.  Multiply the number of servings by the number of carbohydrates per serving.  The resulting number is the total amount of carbohydrates that you will be having.  Learning standard serving sizes of other foods When you eat foods containing carbohydrates that are not packaged or do not include "Nutrition Facts" on the label, you need to measure the servings in order to count the amount of carbohydrates:  Measure the foods that you will eat with a food scale or  measuring cup, if needed.  Decide how many standard-size servings you will eat.  Multiply the number of servings by 15. Most carbohydrate-rich foods have about 15 g of carbohydrates per serving. ? For example, if you eat 8 oz (170 g) of strawberries, you will have eaten 2 servings and 30 g of carbohydrates (2 servings x 15 g = 30 g).  For foods that have more than one food mixed, such as soups and casseroles, you must count the carbohydrates in each food that is included.  The following list contains standard serving sizes of common carbohydrate-rich foods. Each of these servings has about 15 g of carbohydrates:   hamburger bun or  English muffin.   oz (15 mL) syrup.   oz (14 g) jelly.  1 slice of bread.  1 six-inch tortilla.  3 oz (85 g) cooked rice or pasta.  4 oz (113 g) cooked dried beans.  4 oz (113 g) starchy vegetable, such as peas, corn, or potatoes.  4 oz (113 g) hot cereal.  4 oz (113 g) mashed potatoes or  of a large baked potato.  4 oz (113 g) canned or frozen fruit.  4 oz (120 mL) fruit juice.  4-6 crackers.  6 chicken nuggets.  6 oz (170 g) unsweetened dry cereal.  6 oz (170 g) plain fat-free yogurt or yogurt sweetened with artificial sweeteners.  8 oz (240 mL) milk.  8 oz (170 g) fresh fruit or one small piece of fruit.  24 oz (680 g) popped popcorn.  Example of carbohydrate counting Sample meal  3 oz (85 g) chicken breast.    6 oz (170 g) brown rice.  4 oz (113 g) corn.  8 oz (240 mL) milk.  8 oz (170 g) strawberries with sugar-free whipped topping. Carbohydrate calculation 1. Identify the foods that contain carbohydrates: ? Rice. ? Corn. ? Milk. ? Strawberries. 2. Calculate how many servings you have of each food: ? 2 servings rice. ? 1 serving corn. ? 1 serving milk. ? 1 serving strawberries. 3. Multiply each number of servings by 15 g: ? 2 servings rice x 15 g = 30 g. ? 1 serving corn x 15 g = 15 g. ? 1 serving milk x 15  g = 15 g. ? 1 serving strawberries x 15 g = 15 g. 4. Add together all of the amounts to find the total grams of carbohydrates eaten: ? 30 g + 15 g + 15 g + 15 g = 75 g of carbohydrates total. This information is not intended to replace advice given to you by your health care provider. Make sure you discuss any questions you have with your health care provider. Document Released: 08/19/2005 Document Revised: 03/08/2016 Document Reviewed: 01/31/2016 Elsevier Interactive Patient Education  2018 [employer]  Diabetes Mellitus and Nutrition When you have diabetes (diabetes mellitus), it is very important to have healthy eating habits because your blood sugar (glucose) levels are greatly affected by what you eat and drink. Eating healthy foods in the appropriate amounts, at about the same times every day, can help you:  Control your blood glucose.  Lower your risk of heart disease.  Improve your blood pressure.  Reach or maintain a healthy weight.  Every person with diabetes is different, and each person has different needs for a meal plan. Your health care provider may recommend that you work with a diet and nutrition specialist (dietitian) to make a meal plan that is best for you. Your meal plan may vary depending on factors such as:  The calories you need.  The medicines you take.  Your weight.  Your blood glucose, blood pressure, and cholesterol levels.  Your activity level.  Other health conditions you have, such as heart or kidney disease.  How do carbohydrates affect me? Carbohydrates affect your blood glucose level more than any other type of food. Eating carbohydrates naturally increases the amount of glucose in your blood. Carbohydrate counting is a method for keeping track of how many carbohydrates you eat. Counting carbohydrates is important to keep your blood glucose at a healthy level, especially if you use insulin or take certain oral diabetes medicines. It is important  to know how many carbohydrates you can safely have in each meal. This is different for every person. Your dietitian can help you calculate how many carbohydrates you should have at each meal and for snack. Foods that contain carbohydrates include:  Bread, cereal, rice, pasta, and crackers.  Potatoes and corn.  Peas, beans, and lentils.  Milk and yogurt.  Fruit and juice.  Desserts, such as cakes, cookies, ice cream, and candy.  How does alcohol affect me? Alcohol can cause a sudden decrease in blood glucose (hypoglycemia), especially if you use insulin or take certain oral diabetes medicines. Hypoglycemia can be a life-threatening condition. Symptoms of hypoglycemia (sleepiness, dizziness, and confusion) are similar to symptoms of having too much alcohol. If your health care provider says that alcohol is safe for you, follow these guidelines:  Limit alcohol intake to no more than 1 drink per day for nonpregnant women and 2 drinks per day for men.   One drink equals 12 oz of beer, 5 oz of wine, or 1 oz of hard liquor.  Do not drink on an empty stomach.  Keep yourself hydrated with water, diet soda, or unsweetened iced tea.  Keep in mind that regular soda, juice, and other mixers may contain a lot of sugar and must be counted as carbohydrates.  What are tips for following this plan? Reading food labels  Start by checking the serving size on the label. The amount of calories, carbohydrates, fats, and other nutrients listed on the label are based on one serving of the food. Many foods contain more than one serving per package.  Check the total grams (g) of carbohydrates in one serving. You can calculate the number of servings of carbohydrates in one serving by dividing the total carbohydrates by 15. For example, if a food has 30 g of total carbohydrates, it would be equal to 2 servings of carbohydrates.  Check the number of grams (g) of saturated and trans fats in one serving. Choose  foods that have low or no amount of these fats.  Check the number of milligrams (mg) of sodium in one serving. Most people should limit total sodium intake to less than 2,300 mg per day.  Always check the nutrition information of foods labeled as "low-fat" or "nonfat". These foods may be higher in added sugar or refined carbohydrates and should be avoided.  Talk to your dietitian to identify your daily goals for nutrients listed on the label. Shopping  Avoid buying canned, premade, or processed foods. These foods tend to be high in fat, sodium, and added sugar.  Shop around the outside edge of the grocery store. This includes fresh fruits and vegetables, bulk grains, fresh meats, and fresh dairy. Cooking  Use low-heat cooking methods, such as baking, instead of high-heat cooking methods like deep frying.  Cook using healthy oils, such as olive, canola, or sunflower oil.  Avoid cooking with butter, cream, or high-fat meats. Meal planning  Eat meals and snacks regularly, preferably at the same times every day. Avoid going long periods of time without eating.  Eat foods high in fiber, such as fresh fruits, vegetables, beans, and whole grains. Talk to your dietitian about how many servings of carbohydrates you can eat at each meal.  Eat 4-6 ounces of lean protein each day, such as lean meat, chicken, fish, eggs, or tofu. 1 ounce is equal to 1 ounce of meat, chicken, or fish, 1 egg, or 1/4 cup of tofu.  Eat some foods each day that contain healthy fats, such as avocado, nuts, seeds, and fish. Lifestyle   Check your blood glucose regularly.  Exercise at least 30 minutes 5 or more days each week, or as told by your health care provider.  Take medicines as told by your health care provider.  Do not use any products that contain nicotine or tobacco, such as cigarettes and e-cigarettes. If you need help quitting, ask your health care provider.  Work with a counselor or diabetes educator  to identify strategies to manage stress and any emotional and social challenges. What are some questions to ask my health care provider?  Do I need to meet with a diabetes educator?  Do I need to meet with a dietitian?  What number can I call if I have questions?  When are the best times to check my blood glucose? Where to find more information:  American Diabetes Association: diabetes.org/food-and-fitness/food  Academy of Nutrition and Dietetics: www.eatright.org/resources/health/diseases-and-conditions/diabetes    National Institute of Diabetes and Digestive and Kidney Diseases (NIH): www.niddk.nih.gov/health-information/diabetes/overview/diet-eating-physical-activity Summary  A healthy meal plan will help you control your blood glucose and maintain a healthy lifestyle.  Working with a diet and nutrition specialist (dietitian) can help you make a meal plan that is best for you.  Keep in mind that carbohydrates and alcohol have immediate effects on your blood glucose levels. It is important to count carbohydrates and to use alcohol carefully. This information is not intended to replace advice given to you by your health care provider. Make sure you discuss any questions you have with your health care provider. Document Released: 05/16/2005 Document Revised: 09/23/2016 Document Reviewed: 09/23/2016 Elsevier Interactive Patient Education  2018 Elsevier Inc.  

## 2017-12-19 NOTE — Telephone Encounter (Signed)
Patient's daughter phoned in reporting a 267 blood sugar for Patty Roberts. This was taken by EMS that was phoned to see patient at approx. 12:30-12:45pm. Patient has been eating crackers and juice at bedtime then grits and banana for breakfast. She reports the patient was taken off diabetes oral agent last month due to her blood sugars staying low. Patient is not in any distress at this time. Appointment made for 3:30 with M.Johnson, DO.  Reason for Disposition . [1] Blood glucose > 240 mg/dl (13 mmol/l) AND [0][2] vomiting AND [3] unable to check for ketones (in blood or urine)  Answer Assessment - Initial Assessment Questions 1. BLOOD GLUCOSE: "What is your blood glucose level?"      267 at around 12:45 by EMS, 128/60, 97.8 Temp 2. ONSET: "When did you check the blood glucose?"    Within last 30 minutes 3. USUAL RANGE: "What is your glucose level usually?" (e.g., usual fasting morning value, usual evening value)     Usually runs low according to the patient's daughter. 4. KETONES: "Do you check for ketones (urine or blood test strips)?" If yes, ask: "What does the test show now?"     no 5. TYPE 1 or 2:  "Do you know what type of diabetes you have?"  (e.g., Type 1, Type 2, Gestational; doesn't know)  Yes but is not on medication as of recently. 6. INSULIN: "Do you take insulin?" If yes, ask: "Have you missed any shots recently?"    No,eats crackers and juice at bedtime.  7. DIABETES PILLS: "Do you take any pills for your diabetes?" If yes, ask: "Have you missed taking any pills recently?"     no 8. OTHER SYMPTOMS: "Do you have any symptoms?" (e.g., fever, frequent urination, difficulty breathing, dizziness, weakness, vomiting)     None at this time. Was seen by EMS 9. PREGNANCY: "Is there any chance you are pregnant?" "When was your last menstrual period?"   no  Protocols used: DIABETES - HIGH BLOOD SUGAR-A-AH

## 2017-12-19 NOTE — Progress Notes (Signed)
BP (!) 166/70 (BP Location: Left Wrist, Patient Position: Sitting)   Pulse 76   Temp 97.8 F (36.6 C) (Oral)   LMP  (LMP Unknown)   SpO2 93%    Subjective:    Patient ID: Patty Roberts, female    DOB: 06-26-1932, 84 y.o.   MRN: [id number]  HPI: Patty Roberts is a 84 y.o. female  Chief Complaint  Patient presents with  . Hyperglycemia    267 at 1245 checked by EMS   Pt here today to follow up on an EMS call from earlier today that she and her daughter made. Felt nervous, hot, had some heavy breathing, wheezing. Called EMS, sugars were 267 when checked. Per EMS, lungs CTAB and BP WNL. Pt refused transport to hospital and made this appt. Still feeling winded with exertion and very anxious about her sugars. Actos and glimeperide were removed at prior visit due to an episode of hypoglycemia overnight and since sugars have been high per pt and daughter. Denies CP, orthopnea, productive cough, edema, HAs, syncope.   Daughter wanting to discuss good nutrition for diabetes as they feel they aren't sure what she should be eating.   Wheezing x 1 week, trying OTC cold medications. Has had some congestion off and on recently as well. No noted fevers, chills, body aches. Has not been trying anything OTC for sxs. Unsure if any recent sick contacts. Nonsmoker, no hx of pulmonary dz known.   Past Medical History:  Diagnosis Date  . Chronic kidney disease   . Diabetes mellitus without complication (HCC)   . GERD (gastroesophageal reflux disease)   . Hyperlipidemia   . Hypertension    Social History   Socioeconomic History  . Marital status: Widowed    Spouse name: Not on file  . Number of children: Not on file  . Years of education: Not on file  . Highest education level: Not on file  Occupational History  . Not on file  Social Needs  . Financial resource strain: Not on file  . Food insecurity:    Worry: Not on file    Inability: Not on file  . Transportation needs:    Medical: Not on  file    Non-medical: Not on file  Tobacco Use  . Smoking status: Never Smoker  . Smokeless tobacco: Never Used  Substance and Sexual Activity  . Alcohol use: No    Alcohol/week: 0.0 oz  . Drug use: No  . Sexual activity: Never  Lifestyle  . Physical activity:    Days per week: Not on file    Minutes per session: Not on file  . Stress: Not on file  Relationships  . Social connections:    Talks on phone: Not on file    Gets together: Not on file    Attends religious service: Not on file    Active member of club or organization: Not on file    Attends meetings of clubs or organizations: Not on file    Relationship status: Not on file  . Intimate partner violence:    Fear of current or ex partner: Not on file    Emotionally abused: Not on file    Physically abused: Not on file    Forced sexual activity: Not on file  Other Topics Concern  . Not on file  Social History Narrative  . Not on file   Relevant past medical, surgical, family and social history reviewed and updated as indicated. Interim medical  history since our last visit reviewed. Allergies and medications reviewed and updated.  Review of Systems  Per HPI unless specifically indicated above     Objective:    BP (!) 166/70 (BP Location: Left Wrist, Patient Position: Sitting)   Pulse 76   Temp 97.8 F (36.6 C) (Oral)   LMP  (LMP Unknown)   SpO2 93%   Wt Readings from Last 3 Encounters:  12/22/17 220 lb 3.8 oz (99.9 kg)  11/03/17 219 lb (99.3 kg)  10/20/17 209 lb 6.4 oz (95 kg)    Physical Exam  Constitutional: She appears well-developed and well-nourished. No distress.  Eyes: Pupils are equal, round, and reactive to light. Conjunctivae are normal.  Neck: Normal range of motion. Neck supple.  Cardiovascular: Normal rate and normal heart sounds.  Pulmonary/Chest: Effort normal and breath sounds normal. No respiratory distress. She has no wheezes. She has no rales.  Musculoskeletal: Normal range of motion.  She exhibits edema (trace b/l LEs).  Neurological: She is alert. Coordination normal.  Skin: Skin is warm and dry.  Psychiatric: She has a normal mood and affect. Her behavior is normal. Thought content normal.  Nursing note and vitals reviewed.   Results for orders placed or performed in visit on 10/20/17  Bayer DCA Hb A1c Waived  Result Value Ref Range   Bayer DCA Hb A1c Waived 6.0 <7.0 %  Comprehensive metabolic panel  Result Value Ref Range   Glucose 153 (H) 65 - 99 mg/dL   BUN 47 (H) 8 - 27 mg/dL   Creatinine, Ser 6.572.75 (H) 0.57 - 1.00 mg/dL   GFR calc non Af Amer 15 (L) >59 mL/min/1.73   GFR calc Af Amer 18 (L) >59 mL/min/1.73   BUN/Creatinine Ratio 17 12 - 28   Sodium 138 134 - 144 mmol/L   Potassium 4.4 3.5 - 5.2 mmol/L   Chloride 98 96 - 106 mmol/L   CO2 22 20 - 29 mmol/L   Calcium 8.6 (L) 8.7 - 10.3 mg/dL   Total Protein 7.7 6.0 - 8.5 g/dL   Albumin 4.0 3.5 - 4.7 g/dL   Globulin, Total 3.7 1.5 - 4.5 g/dL   Albumin/Globulin Ratio 1.1 (L) 1.2 - 2.2   Bilirubin Total 0.3 0.0 - 1.2 mg/dL   Alkaline Phosphatase 63 39 - 117 IU/L   AST 22 0 - 40 IU/L   ALT 8 0 - 32 IU/L  CBC with Differential/Platelet  Result Value Ref Range   WBC 6.2 3.4 - 10.8 x10E3/uL   RBC 3.33 (L) 3.77 - 5.28 x10E6/uL   Hemoglobin 11.6 11.1 - 15.9 g/dL   Hematocrit 84.634.8 96.234.0 - 46.6 %   MCV 105 (H) 79 - 97 fL   MCH 34.8 (H) 26.6 - 33.0 pg   MCHC 33.3 31.5 - 35.7 g/dL   RDW 95.217.0 (H) 84.112.3 - 32.415.4 %   Platelets 312 150 - 379 x10E3/uL   Neutrophils 57 Not Estab. %   Lymphs 25 Not Estab. %   Monocytes 11 Not Estab. %   Eos 5 Not Estab. %   Basos 1 Not Estab. %   Neutrophils Absolute 3.6 1.4 - 7.0 x10E3/uL   Lymphocytes Absolute 1.5 0.7 - 3.1 x10E3/uL   Monocytes Absolute 0.7 0.1 - 0.9 x10E3/uL   EOS (ABSOLUTE) 0.3 0.0 - 0.4 x10E3/uL   Basophils Absolute 0.0 0.0 - 0.2 x10E3/uL   Immature Granulocytes 1 Not Estab. %   Immature Grans (Abs) 0.1 0.0 - 0.1 x10E3/uL  Assessment & Plan:    Problem List Items Addressed This Visit      Endocrine   Controlled type 2 diabetes mellitus with chronic kidney disease (HCC) - Primary    Will restart glimepiride and watch closely for hypoglycemic episodes. Recheck sugars as scheduled at upcoming f/u. Dietary handouts given and options reviewed. Will get over to the lifestyle center if handouts aren't helping but they want to try these first.       Relevant Medications   glimepiride (AMARYL) 2 MG tablet     Genitourinary   CKD (chronic kidney disease) stage 4, GFR 15-29 ml/min (HCC)    Followed by Nephrology        Other   Anxiety    Difficult to tell how much of the current sxs are from this, as she's struggled with anxiety episodes in the past with some similarity in presentation. Pt not wanting to change or add medications at this time, not interested in counseling. Will continue to monitor, lifestyle tactics reviewed for stress and worry relief       Other Visit Diagnoses    Wheezing       Unclear etiology at this point, will tx for bronchitis with albuterol, tessalon, zpack       Follow up plan: Return in about 1 month (around 01/16/2018) for DM f/u.

## 2017-12-20 ENCOUNTER — Inpatient Hospital Stay
Admission: EM | Admit: 2017-12-20 | Discharge: 2017-12-24 | DRG: 291 | Disposition: A | Discharge status: Home Health | Payer: Medicare Other | Attending: Internal Medicine | Admitting: Internal Medicine

## 2017-12-20 ENCOUNTER — Encounter: Payer: Self-pay | Admitting: Emergency Medicine

## 2017-12-20 ENCOUNTER — Other Ambulatory Visit: Payer: Self-pay

## 2017-12-20 ENCOUNTER — Emergency Department: Payer: Medicare Other

## 2017-12-20 DIAGNOSIS — I5033 Acute on chronic diastolic (congestive) heart failure: Secondary | ICD-10-CM | POA: Diagnosis present

## 2017-12-20 DIAGNOSIS — Z8249 Family history of ischemic heart disease and other diseases of the circulatory system: Secondary | ICD-10-CM | POA: Diagnosis not present

## 2017-12-20 DIAGNOSIS — I130 Hypertensive heart and chronic kidney disease with heart failure and stage 1 through stage 4 chronic kidney disease, or unspecified chronic kidney disease: Principal | ICD-10-CM | POA: Diagnosis present

## 2017-12-20 DIAGNOSIS — N184 Chronic kidney disease, stage 4 (severe): Secondary | ICD-10-CM | POA: Diagnosis present

## 2017-12-20 DIAGNOSIS — K219 Gastro-esophageal reflux disease without esophagitis: Secondary | ICD-10-CM | POA: Diagnosis present

## 2017-12-20 DIAGNOSIS — Z888 Allergy status to other drugs, medicaments and biological substances status: Secondary | ICD-10-CM

## 2017-12-20 DIAGNOSIS — Z7984 Long term (current) use of oral hypoglycemic drugs: Secondary | ICD-10-CM | POA: Diagnosis not present

## 2017-12-20 DIAGNOSIS — E785 Hyperlipidemia, unspecified: Secondary | ICD-10-CM | POA: Diagnosis present

## 2017-12-20 DIAGNOSIS — Z90710 Acquired absence of both cervix and uterus: Secondary | ICD-10-CM

## 2017-12-20 DIAGNOSIS — N179 Acute kidney failure, unspecified: Secondary | ICD-10-CM | POA: Diagnosis present

## 2017-12-20 DIAGNOSIS — E875 Hyperkalemia: Secondary | ICD-10-CM | POA: Diagnosis present

## 2017-12-20 DIAGNOSIS — R0602 Shortness of breath: Secondary | ICD-10-CM | POA: Diagnosis present

## 2017-12-20 DIAGNOSIS — E7800 Pure hypercholesterolemia, unspecified: Secondary | ICD-10-CM | POA: Diagnosis present

## 2017-12-20 DIAGNOSIS — Z79899 Other long term (current) drug therapy: Secondary | ICD-10-CM | POA: Diagnosis not present

## 2017-12-20 DIAGNOSIS — I509 Heart failure, unspecified: Secondary | ICD-10-CM

## 2017-12-20 DIAGNOSIS — E871 Hypo-osmolality and hyponatremia: Secondary | ICD-10-CM | POA: Diagnosis present

## 2017-12-20 DIAGNOSIS — E11649 Type 2 diabetes mellitus with hypoglycemia without coma: Secondary | ICD-10-CM | POA: Diagnosis not present

## 2017-12-20 DIAGNOSIS — I5031 Acute diastolic (congestive) heart failure: Secondary | ICD-10-CM | POA: Diagnosis not present

## 2017-12-20 DIAGNOSIS — E1122 Type 2 diabetes mellitus with diabetic chronic kidney disease: Secondary | ICD-10-CM | POA: Diagnosis present

## 2017-12-20 DIAGNOSIS — Z950 Presence of cardiac pacemaker: Secondary | ICD-10-CM

## 2017-12-20 LAB — INFLUENZA PANEL BY PCR (TYPE A & B)
Influenza A By PCR: NEGATIVE
Influenza B By PCR: NEGATIVE

## 2017-12-20 LAB — TROPONIN I: TROPONIN I: 0.03 ng/mL — AB (ref <0.03)

## 2017-12-20 LAB — BASIC METABOLIC PANEL
ANION GAP: 11 (ref 5–15)
BUN: 38 mg/dL — ABNORMAL HIGH (ref 6–20)
CHLORIDE: 88 mmol/L — AB (ref 101–111)
CO2: 21 mmol/L — ABNORMAL LOW (ref 22–32)
Calcium: 8.3 mg/dL — ABNORMAL LOW (ref 8.9–10.3)
Creatinine, Ser: 1.92 mg/dL — ABNORMAL HIGH (ref 0.44–1.00)
GFR, EST AFRICAN AMERICAN: 27 mL/min — AB (ref >60)
GFR, EST NON AFRICAN AMERICAN: 23 mL/min — AB (ref >60)
Glucose, Bld: 209 mg/dL — ABNORMAL HIGH (ref 65–99)
POTASSIUM: 5.4 mmol/L — AB (ref 3.5–5.1)
SODIUM: 120 mmol/L — AB (ref 135–145)

## 2017-12-20 LAB — CBC
HEMATOCRIT: 34.4 % — AB (ref 35.0–47.0)
HEMOGLOBIN: 11.7 g/dL — AB (ref 12.0–16.0)
MCH: 35.5 pg — ABNORMAL HIGH (ref 26.0–34.0)
MCHC: 34.1 g/dL (ref 32.0–36.0)
MCV: 104.3 fL — AB (ref 80.0–100.0)
Platelets: 348 10*3/uL (ref 150–440)
RBC: 3.29 MIL/uL — ABNORMAL LOW (ref 3.80–5.20)
RDW: 16.3 % — AB (ref 11.5–14.5)
WBC: 9.5 10*3/uL (ref 3.6–11.0)

## 2017-12-20 LAB — GLUCOSE, CAPILLARY
GLUCOSE-CAPILLARY: 224 mg/dL — AB (ref 65–99)
Glucose-Capillary: 223 mg/dL — ABNORMAL HIGH (ref 65–99)

## 2017-12-20 LAB — BRAIN NATRIURETIC PEPTIDE: B Natriuretic Peptide: 310 pg/mL — ABNORMAL HIGH (ref 0.0–100.0)

## 2017-12-20 LAB — MAGNESIUM: Magnesium: 1.7 mg/dL (ref 1.7–2.4)

## 2017-12-20 MED ORDER — INSULIN ASPART 100 UNIT/ML ~~LOC~~ SOLN
0.0000 [IU] | Freq: Three times a day (TID) | SUBCUTANEOUS | Status: DC
  Administered 2017-12-20: 3 [IU] via SUBCUTANEOUS
  Administered 2017-12-22: 2 [IU] via SUBCUTANEOUS
  Administered 2017-12-22 – 2017-12-23 (×3): 1 [IU] via SUBCUTANEOUS
  Administered 2017-12-24: 2 [IU] via SUBCUTANEOUS
  Filled 2017-12-20 (×6): qty 1

## 2017-12-20 MED ORDER — CLONIDINE HCL 0.3 MG/24HR TD PTWK
0.3000 mg | MEDICATED_PATCH | TRANSDERMAL | Status: DC
  Administered 2017-12-21: 0.3 mg via TRANSDERMAL
  Filled 2017-12-20: qty 1

## 2017-12-20 MED ORDER — NITROGLYCERIN 2 % TD OINT
1.0000 [in_us] | TOPICAL_OINTMENT | TRANSDERMAL | Status: DC
  Filled 2017-12-20: qty 1

## 2017-12-20 MED ORDER — SODIUM CHLORIDE 0.9% FLUSH: 3.0000 mL | INTRAVENOUS | Status: DC | PRN

## 2017-12-20 MED ORDER — MECLIZINE HCL 25 MG PO TABS
25.0000 mg | ORAL_TABLET | Freq: Two times a day (BID) | ORAL | Status: DC | PRN
  Filled 2017-12-20: qty 1

## 2017-12-20 MED ORDER — ONDANSETRON HCL 4 MG PO TABS
4.0000 mg | ORAL_TABLET | Freq: Four times a day (QID) | ORAL | Status: DC | PRN
  Filled 2017-12-20: qty 1

## 2017-12-20 MED ORDER — AMLODIPINE BESYLATE 10 MG PO TABS
10.0000 mg | ORAL_TABLET | Freq: Every day | ORAL | Status: DC
  Administered 2017-12-21 – 2017-12-24 (×4): 10 mg via ORAL
  Filled 2017-12-20 (×4): qty 1

## 2017-12-20 MED ORDER — SODIUM CHLORIDE 0.9% FLUSH
3.0000 mL | Freq: Two times a day (BID) | INTRAVENOUS | Status: DC
  Administered 2017-12-20 – 2017-12-24 (×7): 3 mL via INTRAVENOUS

## 2017-12-20 MED ORDER — ACETAMINOPHEN 650 MG RE SUPP: 650.0000 mg | Freq: Four times a day (QID) | RECTAL | Status: DC | PRN

## 2017-12-20 MED ORDER — DIPHENHYDRAMINE HCL 25 MG PO CAPS
25.0000 mg | ORAL_CAPSULE | Freq: Every evening | ORAL | Status: DC | PRN
  Administered 2017-12-20 – 2017-12-23 (×3): 25 mg via ORAL
  Filled 2017-12-20 (×3): qty 1

## 2017-12-20 MED ORDER — BISACODYL 5 MG PO TBEC: 5.0000 mg | DELAYED_RELEASE_TABLET | Freq: Every day | ORAL | Status: DC | PRN

## 2017-12-20 MED ORDER — FUROSEMIDE 10 MG/ML IJ SOLN: 40.0000 mg | Freq: Two times a day (BID) | INTRAMUSCULAR | Status: DC

## 2017-12-20 MED ORDER — CARVEDILOL 12.5 MG PO TABS
12.5000 mg | ORAL_TABLET | Freq: Two times a day (BID) | ORAL | Status: DC
  Administered 2017-12-20 – 2017-12-24 (×8): 12.5 mg via ORAL
  Filled 2017-12-20 (×8): qty 1

## 2017-12-20 MED ORDER — CLONAZEPAM 0.5 MG PO TABS
0.2500 mg | ORAL_TABLET | Freq: Three times a day (TID) | ORAL | Status: DC | PRN
  Administered 2017-12-20 – 2017-12-23 (×2): 0.25 mg via ORAL
  Filled 2017-12-20 (×4): qty 1

## 2017-12-20 MED ORDER — FUROSEMIDE 10 MG/ML IJ SOLN
40.0000 mg | Freq: Two times a day (BID) | INTRAMUSCULAR | Status: DC
  Administered 2017-12-20 – 2017-12-21 (×2): 40 mg via INTRAVENOUS
  Filled 2017-12-20 (×2): qty 4

## 2017-12-20 MED ORDER — SENNOSIDES-DOCUSATE SODIUM 8.6-50 MG PO TABS: 1.0000 | ORAL_TABLET | Freq: Every evening | ORAL | Status: DC | PRN

## 2017-12-20 MED ORDER — IPRATROPIUM-ALBUTEROL 0.5-2.5 (3) MG/3ML IN SOLN
3.0000 mL | Freq: Once | RESPIRATORY_TRACT | Status: AC
Start: 2017-12-20 — End: 2017-12-20
  Administered 2017-12-20: 3 mL via RESPIRATORY_TRACT
  Filled 2017-12-20: qty 3

## 2017-12-20 MED ORDER — HEPARIN SODIUM (PORCINE) 5000 UNIT/ML IJ SOLN
5000.0000 [IU] | Freq: Three times a day (TID) | INTRAMUSCULAR | Status: DC
  Administered 2017-12-20 – 2017-12-24 (×11): 5000 [IU] via SUBCUTANEOUS
  Filled 2017-12-20 (×10): qty 1

## 2017-12-20 MED ORDER — ACETAMINOPHEN 325 MG PO TABS
650.0000 mg | ORAL_TABLET | Freq: Four times a day (QID) | ORAL | Status: DC | PRN
  Administered 2017-12-23: 650 mg via ORAL
  Filled 2017-12-20: qty 2

## 2017-12-20 MED ORDER — FUROSEMIDE 10 MG/ML IJ SOLN
40.0000 mg | Freq: Once | INTRAMUSCULAR | Status: AC
  Administered 2017-12-20: 40 mg via INTRAVENOUS
  Filled 2017-12-20: qty 4

## 2017-12-20 MED ORDER — ONDANSETRON HCL 4 MG/2ML IJ SOLN
4.0000 mg | Freq: Four times a day (QID) | INTRAMUSCULAR | Status: DC | PRN
  Administered 2017-12-22 – 2017-12-23 (×5): 4 mg via INTRAVENOUS
  Filled 2017-12-20 (×5): qty 2

## 2017-12-20 MED ORDER — BENZONATATE 100 MG PO CAPS
200.0000 mg | ORAL_CAPSULE | Freq: Three times a day (TID) | ORAL | Status: DC | PRN
Start: 2017-12-20 — End: 2017-12-24
  Administered 2017-12-23: 200 mg via ORAL
  Filled 2017-12-20: qty 2

## 2017-12-20 MED ORDER — HYDROCODONE-ACETAMINOPHEN 5-325 MG PO TABS: 1.0000 | ORAL_TABLET | ORAL | Status: DC | PRN

## 2017-12-20 MED ORDER — SODIUM CHLORIDE 0.9 % IV SOLN: 250.0000 mL | INTRAVENOUS | Status: DC | PRN

## 2017-12-20 MED ORDER — ROSUVASTATIN CALCIUM 5 MG PO TABS
5.0000 mg | ORAL_TABLET | Freq: Every day | ORAL | Status: DC
  Administered 2017-12-21 – 2017-12-24 (×4): 5 mg via ORAL
  Filled 2017-12-20 (×4): qty 1

## 2017-12-20 MED ORDER — INSULIN ASPART 100 UNIT/ML ~~LOC~~ SOLN
0.0000 [IU] | Freq: Every day | SUBCUTANEOUS | Status: DC
Start: 2017-12-20 — End: 2017-12-24
  Administered 2017-12-20: 2 [IU] via SUBCUTANEOUS
  Filled 2017-12-20: qty 1

## 2017-12-20 MED ORDER — HYDRALAZINE HCL 50 MG PO TABS
50.0000 mg | ORAL_TABLET | Freq: Three times a day (TID) | ORAL | Status: DC
  Administered 2017-12-20 – 2017-12-24 (×12): 50 mg via ORAL
  Filled 2017-12-20 (×12): qty 1

## 2017-12-20 MED ORDER — ALBUTEROL SULFATE (2.5 MG/3ML) 0.083% IN NEBU
2.5000 mg | INHALATION_SOLUTION | RESPIRATORY_TRACT | Status: DC | PRN
  Administered 2017-12-20 – 2017-12-22 (×3): 2.5 mg via RESPIRATORY_TRACT
  Filled 2017-12-20 (×4): qty 3

## 2017-12-20 NOTE — H&P (Signed)
[facility] [place] NAME: Patty Roberts    MR#:  [id number]  DATE OF BIRTH:  [birth year]  DATE OF ADMISSION:  12/20/2017  PRIMARY CARE PHYSICIAN: Kathrine Haddock, NP   REQUESTING/REFERRING PHYSICIAN: Delman Kitten, MD  CHIEF COMPLAINT:   Chief Complaint  Patient presents with  . Wheezing   Shortness of breath for 1 week. HISTORY OF PRESENT ILLNESS:  Patty Roberts  is a 84 y.o. female with a known history of hypertension, hyperlipidemia, diabetes, CKD and GERD.  The patient presents the ED with above chief complaints.  She has had worsening shortness of breath for the past 1 week.  She denies any fever or chills but has cough with sputum.  She has some orthopnea and nocturnal dyspnea but obvious leg edema.  She is treated with DuoNeb and IV Solu-Medrol by EMS with some slight improvement.  Chest x-ray show moderate CHF.  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  PAST MEDICAL HISTORY:   Past Medical History:  Diagnosis Date  . Chronic kidney disease   . Diabetes mellitus without complication ([place])   . GERD (gastroesophageal reflux disease)   . Hyperlipidemia   . Hypertension     PAST SURGICAL HISTORY:   Past Surgical History:  Procedure Laterality Date  . ABDOMINAL HYSTERECTOMY    . ANKLE FRACTURE SURGERY    . HERNIA REPAIR    . PACEMAKER INSERTION     x2  . vertebral fix      SOCIAL HISTORY:   Social History   Tobacco Use  . Smoking status: Never Smoker  . Smokeless tobacco: Never Used  Substance Use Topics  . Alcohol use: No    Alcohol/week: 0.0 oz    FAMILY HISTORY:   Family History  Problem Relation Age of Onset  . Hypertension Mother   . Thyroid disease Daughter   . Heart disease Son        heart attack    DRUG ALLERGIES:   Allergies  Allergen Reactions  . Phenameth [Promethazine]   . Statins Other (See Comments)    Leg pain     REVIEW OF SYSTEMS:   Review of Systems  Constitutional:  Positive for malaise/fatigue. Negative for chills and fever.  HENT: Negative for sore throat.   Eyes: Negative for blurred vision and double vision.  Respiratory: Positive for cough, sputum production, shortness of breath and wheezing. Negative for hemoptysis and stridor.   Cardiovascular: Negative for chest pain, palpitations, orthopnea and leg swelling.  Gastrointestinal: Positive for nausea. Negative for abdominal pain, blood in stool, diarrhea, melena and vomiting.  Genitourinary: Negative for dysuria, flank pain and hematuria.  Musculoskeletal: Negative for back pain and joint pain.  Skin: Negative for rash.  Neurological: Negative for dizziness, sensory change, focal weakness, seizures, loss of consciousness, weakness and headaches.  Endo/Heme/Allergies: Negative for polydipsia.  Psychiatric/Behavioral: Negative for depression. The patient is not nervous/anxious.     MEDICATIONS AT HOME:   Prior to Admission medications   Medication Sig Start Date End Date Taking? Authorizing Provider  albuterol (PROVENTIL HFA;VENTOLIN HFA) 108 (90 Base) MCG/ACT inhaler Inhale 2 puffs into the lungs every 6 (six) hours as needed for wheezing or shortness of breath. 12/19/17  Yes Volney American, PA-C  aliskiren (TEKTURNA) 300 MG tablet Take 1 tablet (300 mg total) by mouth daily. 12/09/17  Yes Kathrine Haddock, NP  amLODipine (NORVASC) 10 MG tablet Take 1 tablet (10 mg total) by mouth  daily. 10/20/17  Yes Kathrine Haddock, NP  azithromycin (ZITHROMAX) 250 MG tablet Take 2 tabs day one, then 1 tab daily until complete Patient taking differently: Take 250-500 mg by mouth daily. Take 2 tabs day one, then 1 tab daily until complete 12/19/17  Yes Orene Desanctis, Lilia Argue, PA-C  benzonatate (TESSALON) 200 MG capsule Take 1 capsule (200 mg total) by mouth 3 (three) times daily as needed. 12/19/17  Yes Volney American, PA-C  carvedilol (COREG) 12.5 MG tablet Take 1 tablet (12.5 mg total) by mouth 2 (two)  times daily with a meal. Instead of Bystolic 1/82/99  Yes Kathrine Haddock, NP  cloNIDine (CATAPRES - DOSED IN MG/24 HR) 0.3 mg/24hr patch APPLY 1 PATCH TO SKIN ONCE A WEEK 10/20/17  Yes Kathrine Haddock, NP  furosemide (LASIX) 40 MG tablet Take 1 tablet (40 mg total) by mouth daily. 10/20/17  Yes Kathrine Haddock, NP  glimepiride (AMARYL) 2 MG tablet take 1 tablet by mouth before BREAKFAST 12/19/17  Yes Volney American, PA-C  hydrALAZINE (APRESOLINE) 50 MG tablet Take 1 tablet (50 mg total) by mouth 3 (three) times daily. 10/20/17  Yes Kathrine Haddock, NP  meclizine (ANTIVERT) 25 MG tablet TAKE 1 TABLET TWICE A DAY AS NEEDED FOR DIZZINESS 10/20/17  Yes Kathrine Haddock, NP  omeprazole (PRILOSEC) 20 MG capsule Take 1 capsule (20 mg total) by mouth every other day. 10/20/17  Yes Kathrine Haddock, NP  ondansetron (ZOFRAN-ODT) 8 MG disintegrating tablet dissolve 1 tablet ON TONGUE three times a day if needed for nausea and vomiting 10/20/17  Yes Kathrine Haddock, NP  rosuvastatin (CRESTOR) 5 MG tablet Take 1 tablet (5 mg total) by mouth daily. 10/20/17  Yes Kathrine Haddock, NP  blood glucose meter kit and supplies Dispense based on patient and insurance preference. Use up to four times daily as directed. (FOR ICD-9 250.00, 250.01). 11/03/17   Kathrine Haddock, NP  meclizine (ANTIVERT) 25 MG tablet TAKE 1 TABLET TWICE A DAY AS NEEDED FOR DIZZINESS Patient not taking: Reported on 12/19/2017 11/27/17   Guadalupe Maple, MD  omeprazole (PRILOSEC) 20 MG capsule TAKE 1 CAPSULE EVERY OTHER DAY Patient not taking: Reported on 12/19/2017 12/15/17   Kathrine Haddock, NP  pioglitazone (ACTOS) 30 MG tablet Take 1 tablet (30 mg total) by mouth daily. Patient not taking: Reported on 12/19/2017 10/20/17   Kathrine Haddock, NP  sertraline (ZOLOFT) 50 MG tablet Take 1 tablet (50 mg total) by mouth daily. Patient not taking: Reported on 12/19/2017 10/20/17   Kathrine Haddock, NP      VITAL SIGNS:  Blood pressure (!) 162/47, pulse 80, temperature  99.8 F (37.7 C), temperature source Axillary, resp. rate (!) 27, height 5' 2"  (1.575 m), weight 211 lb (95.7 kg), SpO2 95 %.  PHYSICAL EXAMINATION:  Physical Exam  GENERAL:  84 y.o.-year-old patient lying in the bed with no acute distress.  EYES: Pupils equal, round, reactive to light and accommodation. No scleral icterus. Extraocular muscles intact.  HEENT: Head atraumatic, normocephalic. Oropharynx and nasopharynx clear.  NECK:  Supple, no jugular venous distention. No thyroid enlargement, no tenderness.  LUNGS: Normal breath sounds bilaterally, no wheezing, bilateral basilar rales, no rhonchi or crepitation. No use of accessory muscles of respiration.  CARDIOVASCULAR: S1, S2 normal. No murmurs, rubs, or gallops.  ABDOMEN: Soft, nontender, nondistended. Bowel sounds present. No organomegaly or mass.  EXTREMITIES: No cyanosis, or clubbing.  Bilateral leg trace edema. NEUROLOGIC: Cranial nerves II through XII are intact. Muscle strength 5/5 in all extremities. Sensation  intact. Gait not checked.  PSYCHIATRIC: The patient is alert and oriented x 3.  SKIN: No obvious rash, lesion, or ulcer.   LABORATORY PANEL:   CBC Recent Labs  Lab 12/20/17 1123  WBC 9.5  HGB 11.7*  HCT 34.4*  PLT 348   ------------------------------------------------------------------------------------------------------------------  Chemistries  No results for input(s): NA, K, CL, CO2, GLUCOSE, BUN, CREATININE, CALCIUM, MG, AST, ALT, ALKPHOS, BILITOT in the last 168 hours.  Invalid input(s): GFRCGP ------------------------------------------------------------------------------------------------------------------  Cardiac Enzymes No results for input(s): TROPONINI in the last 168 hours. ------------------------------------------------------------------------------------------------------------------  RADIOLOGY:  Dg Chest 2 View  Result Date: 12/20/2017 CLINICAL DATA:  84 year old female with shortness of  breath EXAM: CHEST - 2 VIEW COMPARISON:  Prior chest x-ray 06/19/2016 FINDINGS: Interval worsening of pulmonary vascular congestion now with moderate interstitial pulmonary edema. Stable cardiomegaly. Left subclavian approach cardiac rhythm maintenance device remains in unchanged position with 3 leads overlying the heart, 1 in the right atrium and 2 in the right ventricle. No evidence of pneumothorax. Probable small bilateral pleural effusions. No acute osseous abnormality. IMPRESSION: Interval development of moderate CHF. Electronically Signed   By: Jacqulynn Cadet M.D.   On: 12/20/2017 11:55      IMPRESSION AND PLAN:   Acute new onset of CHF. The patient will be admitted to telemetry floor. Start Lasix 40 mg IV twice daily, echocardiogram and cardiology consult.  CKD stage IV.  Follow-up BMP while on Lasix.  Hypertension.  Continue home hypertension medication.  Diabetes.  Start sliding scale.  All the records are reviewed and case discussed with ED provider. Management plans discussed with the patient, her 2 daughters and they are in agreement.  CODE STATUS: Full code  TOTAL TIME TAKING CARE OF THIS PATIENT: 55 minutes.    Demetrios Loll M.D on 12/20/2017 at 2:15 PM  Between 7am to 6pm - Pager - [phone]  After 6pm go to [email or web address] - Proofreader  [facility]  Office  [phone]  CC: Primary care physician; Kathrine Haddock, NP   Note: This dictation was prepared with Dragon dictation along with smaller phrase technology. Any transcriptional errors that result from this process are unin

## 2017-12-20 NOTE — ED Provider Notes (Signed)
[facility] Emergency Department Provider Note   ____________________________________________   First MD Initiated Contact with Patient 12/20/17 1121     (approximate)  I have reviewed the triage vital signs and the nursing notes.   HISTORY  Chief Complaint Wheezing    HPI Patty Roberts is a 84 y.o. female previous history of diabetes, chronic kidney disease, and occasionally using an inhaler over time but reports no known history of asthma, COPD or emphysema.  Saw her primary doctor yesterday for cough and shortness of breath given an inhaler and azithromycin.  Reports her symptoms are worsening.  No nausea or vomiting.  No fevers or chills.  No chest pain.  She does report feeling short of breath especially any types of stand or move.  She does report she feels like she is wheezing.  EMS reports they gave her additional 2 duo nebs and 125 mg of Solu-Medrol.  The patient does report some slight improvement in her work of breathing at this time.  Reports mild shortness of breath now, severe shortness of breath with any attempt to lay down.  She has been noticing increased shortness of breath when she lays down at night the last few days as well.    Past Medical History:  Diagnosis Date  . Chronic kidney disease   . Diabetes mellitus without complication ([place])   . GERD (gastroesophageal reflux disease)   . Hyperlipidemia   . Hypertension     Patient Active Problem List   Diagnosis Date Noted  . Acute CHF (congestive heart failure) ([place]) 12/20/2017  . Advanced care planning/counseling discussion 04/04/2017  . Anxiety 12/27/2016  . Ceruminosis, bilateral 12/27/2016  . Low back pain 04/12/2015  . Hypertensive CKD (chronic kidney disease) 04/11/2015  . Hyperlipidemia 04/11/2015  . Controlled type 2 diabetes mellitus with chronic kidney disease ([place]) 04/11/2015  . GERD (gastroesophageal reflux disease) 04/11/2015  . CKD (chronic kidney disease) stage  4, GFR 15-29 ml/min ([facility]) 04/11/2015  . Heart murmur, systolic 85/88/5027    Past Surgical History:  Procedure Laterality Date  . ABDOMINAL HYSTERECTOMY    . ANKLE FRACTURE SURGERY    . HERNIA REPAIR    . PACEMAKER INSERTION     x2  . vertebral fix      Prior to Admission medications   Medication Sig Start Date End Date Taking? Authorizing Provider  albuterol (PROVENTIL HFA;VENTOLIN HFA) 108 (90 Base) MCG/ACT inhaler Inhale 2 puffs into the lungs every 6 (six) hours as needed for wheezing or shortness of breath. 12/19/17  Yes Volney American, PA-C  aliskiren (TEKTURNA) 300 MG tablet Take 1 tablet (300 mg total) by mouth daily. 12/09/17  Yes Kathrine Haddock, NP  amLODipine (NORVASC) 10 MG tablet Take 1 tablet (10 mg total) by mouth daily. 10/20/17  Yes Kathrine Haddock, NP  azithromycin (ZITHROMAX) 250 MG tablet Take 2 tabs day one, then 1 tab daily until complete Patient taking differently: Take 250-500 mg by mouth daily. Take 2 tabs day one, then 1 tab daily until complete 12/19/17  Yes Orene Desanctis, Lilia Argue, PA-C  benzonatate (TESSALON) 200 MG capsule Take 1 capsule (200 mg total) by mouth 3 (three) times daily as needed. 12/19/17  Yes Volney American, PA-C  carvedilol (COREG) 12.5 MG tablet Take 1 tablet (12.5 mg total) by mouth 2 (two) times daily with a meal. Instead of Bystolic 7/41/28  Yes Kathrine Haddock, NP  cloNIDine (CATAPRES - DOSED IN MG/24 HR) 0.3 mg/24hr patch APPLY 1 PATCH  TO SKIN ONCE A WEEK 10/20/17  Yes Kathrine Haddock, NP  furosemide (LASIX) 40 MG tablet Take 1 tablet (40 mg total) by mouth daily. 10/20/17  Yes Kathrine Haddock, NP  glimepiride (AMARYL) 2 MG tablet take 1 tablet by mouth before BREAKFAST 12/19/17  Yes Volney American, PA-C  hydrALAZINE (APRESOLINE) 50 MG tablet Take 1 tablet (50 mg total) by mouth 3 (three) times daily. 10/20/17  Yes Kathrine Haddock, NP  meclizine (ANTIVERT) 25 MG tablet TAKE 1 TABLET TWICE A DAY AS NEEDED FOR DIZZINESS 10/20/17  Yes  Kathrine Haddock, NP  omeprazole (PRILOSEC) 20 MG capsule Take 1 capsule (20 mg total) by mouth every other day. 10/20/17  Yes Kathrine Haddock, NP  ondansetron (ZOFRAN-ODT) 8 MG disintegrating tablet dissolve 1 tablet ON TONGUE three times a day if needed for nausea and vomiting 10/20/17  Yes Kathrine Haddock, NP  rosuvastatin (CRESTOR) 5 MG tablet Take 1 tablet (5 mg total) by mouth daily. 10/20/17  Yes Kathrine Haddock, NP  blood glucose meter kit and supplies Dispense based on patient and insurance preference. Use up to four times daily as directed. (FOR ICD-9 250.00, 250.01). 11/03/17   Kathrine Haddock, NP  meclizine (ANTIVERT) 25 MG tablet TAKE 1 TABLET TWICE A DAY AS NEEDED FOR DIZZINESS Patient not taking: Reported on 12/19/2017 11/27/17   Guadalupe Maple, MD  omeprazole (PRILOSEC) 20 MG capsule TAKE 1 CAPSULE EVERY OTHER DAY Patient not taking: Reported on 12/19/2017 12/15/17   Kathrine Haddock, NP  pioglitazone (ACTOS) 30 MG tablet Take 1 tablet (30 mg total) by mouth daily. Patient not taking: Reported on 12/19/2017 10/20/17   Kathrine Haddock, NP  sertraline (ZOLOFT) 50 MG tablet Take 1 tablet (50 mg total) by mouth daily. Patient not taking: Reported on 12/19/2017 10/20/17   Kathrine Haddock, NP    Allergies Phenameth [promethazine] and Statins  Family History  Problem Relation Age of Onset  . Hypertension Mother   . Thyroid disease Daughter   . Heart disease Son        heart attack    Social History Social History   Tobacco Use  . Smoking status: Never Smoker  . Smokeless tobacco: Never Used  Substance Use Topics  . Alcohol use: No    Alcohol/week: 0.0 oz  . Drug use: No    Review of Systems Constitutional: No fever/chills Eyes: No visual changes. ENT: No sore throat. Cardiovascular: Denies chest pain. Respiratory: See HPI gastrointestinal: No abdominal pain.  No nausea, no vomiting.  No diarrhea.  No constipation. Genitourinary: Negative for dysuria. Musculoskeletal: Negative for  back pain.  Slightly swollen in the legs last couple of days. Skin: Negative for rash. Neurological: Negative for headaches, focal weakness or numbness.    ____________________________________________   PHYSICAL EXAM:  VITAL SIGNS: ED Triage Vitals  Enc Vitals Group     BP 12/20/17 1111 (!) 178/70     Pulse Rate 12/20/17 1111 78     Resp 12/20/17 1111 (!) 33     Temp 12/20/17 1111 99.8 F (37.7 C)     Temp Source 12/20/17 1111 Axillary     SpO2 12/20/17 1114 96 %     Weight 12/20/17 1114 211 lb (95.7 kg)     Height 12/20/17 1114 5' 2"  (1.575 m)     Head Circumference --      Peak Flow --      Pain Score 12/20/17 1114 0     Pain Loc --      Pain  Edu? --      Excl. in [place]? --     Constitutional: Alert and oriented.  Appears mildly dyspneic.  Referring to sit upright. Eyes: Conjunctivae are normal. Head: Atraumatic. Nose: No congestion/rhinnorhea. Mouth/Throat: Mucous membranes are moist. Neck: No stridor.   Cardiovascular: Normal rate, regular rhythm. Grossly normal heart sounds.  Good peripheral circulation. Respiratory: Mild to moderate tachypnea.  Diminished lung sounds in the bases bilateral.  Increased expiratory phase but no notable end expiratory wheezing is noted at this time.  She speaks in short phrases.  Mild use of accessory muscles. Gastrointestinal: Soft and nontender. No distention. Musculoskeletal: No lower extremity tenderness nor edema. Neurologic:  Normal speech and language. No gross focal neurologic deficits are appreciated.  Skin:  Skin is warm, dry and intact. No rash noted. Psychiatric: Mood and affect are normal. Speech and behavior are normal.  ____________________________________________   LABS (all labs ordered are listed, but only abnormal results are displayed)  Labs Reviewed  CBC - Abnormal; Notable for the following components:      Result Value   RBC 3.29 (*)    Hemoglobin 11.7 (*)    HCT 34.4 (*)    MCV 104.3 (*)    MCH 35.5 (*)     RDW 16.3 (*)    All other components within normal limits  BRAIN NATRIURETIC PEPTIDE - Abnormal; Notable for the following components:   B Natriuretic Peptide 310.0 (*)    All other components within normal limits  BASIC METABOLIC PANEL - Abnormal; Notable for the following components:   Sodium 120 (*)    Potassium 5.4 (*)    Chloride 88 (*)    CO2 21 (*)    Glucose, Bld 209 (*)    BUN 38 (*)    Creatinine, Ser 1.92 (*)    Calcium 8.3 (*)    GFR calc non Af Amer 23 (*)    GFR calc Af Amer 27 (*)    All other components within normal limits  TROPONIN I - Abnormal; Notable for the following components:   Troponin I 0.03 (*)    All other components within normal limits  GLUCOSE, CAPILLARY - Abnormal; Notable for the following components:   Glucose-Capillary 224 (*)    All other components within normal limits  GLUCOSE, CAPILLARY - Abnormal; Notable for the following components:   Glucose-Capillary 223 (*)    All other components within normal limits  INFLUENZA PANEL BY PCR (TYPE A & B)  MAGNESIUM  CBC  BASIC METABOLIC PANEL   ____________________________________________  EKG  Reviewed enterotomy at 1125 Heart rate 75 QRS 160 QTC 500 Ventricular pacing, no evidence of acute ischemia ____________________________________________  RADIOLOGY  Dg Chest 2 View  Result Date: 12/20/2017 CLINICAL DATA:  84 year old female with shortness of breath EXAM: CHEST - 2 VIEW COMPARISON:  Prior chest x-ray 06/19/2016 FINDINGS: Interval worsening of pulmonary vascular congestion now with moderate interstitial pulmonary edema. Stable cardiomegaly. Left subclavian approach cardiac rhythm maintenance device remains in unchanged position with 3 leads overlying the heart, 1 in the right atrium and 2 in the right ventricle. No evidence of pneumothorax. Probable small bilateral pleural effusions. No acute osseous abnormality. IMPRESSION: Interval development of moderate CHF. Electronically Signed    By: Jacqulynn Cadet M.D.   On: 12/20/2017 11:55    Chest x-ray reviewed, CHF ____________________________________________   PROCEDURES  Procedure(s) performed: None  Procedures  Critical Care performed: No  ____________________________________________   INITIAL IMPRESSION / ASSESSMENT AND PLAN /  ED COURSE  Pertinent labs & imaging results that were available during my care of the patient were reviewed by me and considered in my medical decision making (see chart for details).  Patient presents for evaluation of dyspnea.  Some mild edema noted about the ankles, also increasing shortness of breath with laying at night and JVD are notable.  Initially treated for possible COPD, but clinical picture appears to be most consistent with CHF exacerbation, which would be her new in onset for this patient.    ----------------------------------------- 12:03 PM on 12/20/2017 -----------------------------------------  Patient resting.  Family at bedside.  Reviewed her chest x-ray findings with her and plan of care including starting IV Lasix.  At present, chest x-ray and history seem to suggest likely CHF exacerbation.  Will treat with IV diuretic, also Nitropaste as the patient is hypertensive.  She is tolerating well, does not appear to be at risk for fatiguing, does not appear to need any BiPAP at present.  Discussed with patient and family, anticipate admission.  ____________________________________________   FINAL CLINICAL IMPRESSION(S) / ED DIAGNOSES  Final diagnoses:  Acute congestive heart failure, unspecified heart failure type ([place])      NEW MEDICATIONS STARTED DURING THIS VISIT:  Current Discharge Medication List       Note:  This document was prepared using Dragon voice recognition software and may include unintentional dictation errors.    Delman Kitten, MD 12/20/17 2129

## 2017-12-20 NOTE — ED Notes (Signed)
Report given to lanea

## 2017-12-20 NOTE — Progress Notes (Signed)
Talked to Dr. Anne HahnWillis about patient's feeling of "not getting enough air" patient's SpO2 was 93% in 2L, she also received total of 80 IV of lasix and RN just gave breathing treatment. Patient peed 250 ml once and had twice occurrence, patient now on external cath, bladder scanned patient and only have 51 ml in her bladder. Order to give another 40 of IV lasix now. Patient is also requesting for sleeping aid order given for benadryl PRN. Also order given for fan. No other concern at the moment. RN will continue to monitor.

## 2017-12-20 NOTE — Progress Notes (Signed)
Talked to Dr. Katheren ShamsSalary about patient being anxious, order given for Klonopin 0.25 mg TID PRN. No other concern at the moment. RN will continue to monitor.

## 2017-12-20 NOTE — ED Triage Notes (Signed)
Pt placed on proair by pcp yesterday along with zithromax. New wheezing and sob x few days worse today.

## 2017-12-20 NOTE — Progress Notes (Signed)
Advanced Care Plan.  Purpose of Encounter: CODE STATUS. Parties in Attendance: The patient, HER-2 daughters and me. Patient's Decisional Capacity: Yes. Medical Story: Patty Roberts  is a 84 y.o. female with a known history of hypertension, hyperlipidemia, diabetes, CKD and GERD.  She has had worsening shortness of breath for 1 week and is being admitted for new onset CHF.  I discussed with the patient and her daughters about current condition, prognosis and CODE STATUS.  The patient clearly stated that she wants full code.   Plan:  Code Status: Full code. Time spent discussing advance care planning: 17 minutes.

## 2017-12-20 NOTE — ED Notes (Signed)
Pt family is video taping while dr in room. Advised not allowed. Daughter continued so I spoke with charge. It is against policy. Daughter had stopped taping when I came back so I again advised this is not allowed and that per policy she would be escorted off the property if occurred again. Daughter states "we did it last time". Advised the doctor or nurse may not have realized since she was pointing the cell phone at them, but still against policy"

## 2017-12-21 ENCOUNTER — Inpatient Hospital Stay (HOSPITAL_COMMUNITY)
Admit: 2017-12-21 | Discharge: 2017-12-21 | Disposition: A | Discharge status: Home / Self Care | Payer: Medicare Other | Attending: Internal Medicine | Admitting: Internal Medicine

## 2017-12-21 ENCOUNTER — Inpatient Hospital Stay: Payer: Medicare Other

## 2017-12-21 DIAGNOSIS — I5031 Acute diastolic (congestive) heart failure: Secondary | ICD-10-CM

## 2017-12-21 LAB — BASIC METABOLIC PANEL
ANION GAP: 9 (ref 5–15)
BUN: 48 mg/dL — ABNORMAL HIGH (ref 6–20)
CALCIUM: 8.2 mg/dL — AB (ref 8.9–10.3)
CO2: 24 mmol/L (ref 22–32)
Chloride: 86 mmol/L — ABNORMAL LOW (ref 101–111)
Creatinine, Ser: 2.2 mg/dL — ABNORMAL HIGH (ref 0.44–1.00)
GFR, EST AFRICAN AMERICAN: 23 mL/min — AB (ref >60)
GFR, EST NON AFRICAN AMERICAN: 20 mL/min — AB (ref >60)
Glucose, Bld: 127 mg/dL — ABNORMAL HIGH (ref 65–99)
Potassium: 6 mmol/L — ABNORMAL HIGH (ref 3.5–5.1)
SODIUM: 119 mmol/L — AB (ref 135–145)

## 2017-12-21 LAB — CBC
HCT: 32 % — ABNORMAL LOW (ref 35.0–47.0)
Hemoglobin: 10.9 g/dL — ABNORMAL LOW (ref 12.0–16.0)
MCH: 35.5 pg — ABNORMAL HIGH (ref 26.0–34.0)
MCHC: 33.9 g/dL (ref 32.0–36.0)
MCV: 104.8 fL — ABNORMAL HIGH (ref 80.0–100.0)
PLATELETS: 349 10*3/uL (ref 150–440)
RBC: 3.06 MIL/uL — ABNORMAL LOW (ref 3.80–5.20)
RDW: 16.3 % — ABNORMAL HIGH (ref 11.5–14.5)
WBC: 8.7 10*3/uL (ref 3.6–11.0)

## 2017-12-21 LAB — ECHOCARDIOGRAM COMPLETE
HEIGHTINCHES: 62 in
Weight: 3454.4 oz

## 2017-12-21 LAB — GLUCOSE, CAPILLARY
GLUCOSE-CAPILLARY: 55 mg/dL — AB (ref 65–99)
GLUCOSE-CAPILLARY: 43 mg/dL — AB (ref 65–99)
GLUCOSE-CAPILLARY: 62 mg/dL — AB (ref 65–99)
Glucose-Capillary: 75 mg/dL (ref 65–99)
Glucose-Capillary: 64 mg/dL — ABNORMAL LOW (ref 65–99)

## 2017-12-21 MED ORDER — SODIUM POLYSTYRENE SULFONATE 15 GM/60ML PO SUSP
30.0000 g | Freq: Once | ORAL | Status: AC
Start: 2017-12-21 — End: 2017-12-21
  Administered 2017-12-21: 30 g via ORAL
  Filled 2017-12-21: qty 120

## 2017-12-21 MED ORDER — DEXTROSE 10 % IV SOLN: INTRAVENOUS | Status: DC

## 2017-12-21 MED ORDER — FUROSEMIDE 10 MG/ML IJ SOLN
60.0000 mg | Freq: Three times a day (TID) | INTRAMUSCULAR | Status: DC
  Administered 2017-12-21: 60 mg via INTRAVENOUS
  Filled 2017-12-21: qty 6

## 2017-12-21 MED ORDER — PATIROMER SORBITEX CALCIUM 8.4 G PO PACK
16.8000 g | PACK | Freq: Every day | ORAL | Status: DC
  Administered 2017-12-21 – 2017-12-23 (×3): 16.8 g via ORAL
  Filled 2017-12-21 (×3): qty 4

## 2017-12-21 MED ORDER — DEXTROSE 10 % IV SOLN
INTRAVENOUS | Status: DC
  Administered 2017-12-21: 18:00:00 via INTRAVENOUS

## 2017-12-21 NOTE — Progress Notes (Signed)
Echo result back, patient has normal EF, stop IV lasix for now.

## 2017-12-21 NOTE — Progress Notes (Signed)
[facility]  ROUNDING NOTE   Subjective:  Patient known to [place] as Dr. Candiss Norse follows her in the office.  Her baseline creatinine is 1.8 with an EGFR of 29. She presents now with shortness of breath. She also has significant hyponatremia with a serum sodium of 119 and serum potassium that was quite high at 6.0.  Creatinine also above her baseline at 2.2.  Objective:  Vital signs in last 24 hours:  Temp:  [97.5 F (36.4 C)-98.2 F (36.8 C)] 97.6 F (36.4 C) (04/21 0749) Pulse Rate:  [59-80] 70 (04/21 0749) Resp:  [18-27] 20 (04/21 0749) BP: (130-162)/(47-74) 150/61 (04/21 0749) SpO2:  [93 %-99 %] 97 % (04/21 0749) Weight:  [97.9 kg (215 lb 14.4 oz)] 97.9 kg (215 lb 14.4 oz) (04/21 0441)  Weight change:  Filed Weights   12/20/17 1114 12/21/17 0441  Weight: 95.7 kg (211 lb) 97.9 kg (215 lb 14.4 oz)    Intake/Output: I/O last 3 completed shifts: In: 240 [P.O.:240] Out: 450 [Urine:450]   Intake/Output this shift:  Total I/O In: -  Out: 1700 [Urine:1700]  Physical Exam: General: No acute distress  Head: Normocephalic, atraumatic. Moist oral mucosal membranes  Eyes: Anicteric  Neck: Supple, trachea midline  Lungs:  Basilar rales, normal effort  Heart: S1S2 no rubs  Abdomen:  Soft, nontender, bowel sounds present  Extremities: Trace peripheral edema.  Neurologic: Awake, alert, following commands  Skin: No lesions       Basic Metabolic Panel: Recent Labs  Lab 12/20/17 1539 12/21/17 0430  NA 120* 119*  K 5.4* 6.0*  CL 88* 86*  CO2 21* 24  GLUCOSE 209* 127*  BUN 38* 48*  CREATININE 1.92* 2.20*  CALCIUM 8.3* 8.2*  MG 1.7  --     Liver Function Tests: No results for input(s): AST, ALT, ALKPHOS, BILITOT, PROT, ALBUMIN in the last 168 hours. No results for input(s): LIPASE, AMYLASE in the last 168 hours. No results for input(s): AMMONIA in the last 168 hours.  CBC: Recent Labs  Lab 12/20/17 1123 12/21/17 0430  WBC 9.5 8.7  HGB 11.7* 10.9*   HCT 34.4* 32.0*  MCV 104.3* 104.8*  PLT 348 349    Cardiac Enzymes: Recent Labs  Lab 12/20/17 1539  TROPONINI 0.03*    BNP: Invalid input(s): POCBNP  CBG: Recent Labs  Lab 12/20/17 1707 12/20/17 2100 12/21/17 0810 12/21/17 0842 12/21/17 1156  GLUCAP 224* 223* 55* 75 64*    Microbiology: No results found for this or any previous visit.  Coagulation Studies: No results for input(s): LABPROT, INR in the last 72 hours.  Urinalysis: No results for input(s): COLORURINE, LABSPEC, PHURINE, GLUCOSEU, HGBUR, BILIRUBINUR, KETONESUR, PROTEINUR, UROBILINOGEN, NITRITE, LEUKOCYTESUR in the last 72 hours.  Invalid input(s): APPERANCEUR    Imaging: Dg Chest 2 View  Result Date: 12/20/2017 CLINICAL DATA:  84 year old female with shortness of breath EXAM: CHEST - 2 VIEW COMPARISON:  Prior chest x-ray 06/19/2016 FINDINGS: Interval worsening of pulmonary vascular congestion now with moderate interstitial pulmonary edema. Stable cardiomegaly. Left subclavian approach cardiac rhythm maintenance device remains in unchanged position with 3 leads overlying the heart, 1 in the right atrium and 2 in the right ventricle. No evidence of pneumothorax. Probable small bilateral pleural effusions. No acute osseous abnormality. IMPRESSION: Interval development of moderate CHF. Electronically Signed   By: Jacqulynn Cadet M.D.   On: 12/20/2017 11:55     Medications:   . sodium chloride     . amLODipine  10 mg Oral  Daily  . carvedilol  12.5 mg Oral BID WC  . cloNIDine  0.3 mg Transdermal Weekly  . furosemide  60 mg Intravenous TID  . heparin  5,000 Units Subcutaneous Q8H  . hydrALAZINE  50 mg Oral TID  . insulin aspart  0-5 Units Subcutaneous QHS  . insulin aspart  0-9 Units Subcutaneous TID WC  . patiromer  16.8 g Oral Daily  . rosuvastatin  5 mg Oral Daily  . sodium chloride flush  3 mL Intravenous Q12H   sodium chloride, acetaminophen **OR** acetaminophen, albuterol, benzonatate,  bisacodyl, clonazePAM, diphenhydrAMINE, HYDROcodone-acetaminophen, meclizine, ondansetron **OR** ondansetron (ZOFRAN) IV, senna-docusate, sodium chloride flush  Assessment/ Plan:  84 y.o. female with history of diabetes, hypertension, hypercholesterolemia and chronic kidney disease stage III, baseline Cr 1.8, egfr 29.   1.  Acute renal failure/CKD stage IV baseline EGFR 29.  2.  Hyponatremia. 3.  Shortness of breath at admission.  4.  Hypertension.  5.  Hyperkalemia, K 6.0 at the moment.   Plan:  We are asked to see the patient for evaluation management of acute renal failure, chronic kidney disease stage IV, hyperkalemia, and hyponatremia.  The current thought is that she may have new onset heart failure but 2D echocardiogram report is still pending.  Patient's Lasix was just increased to 60 mg IV 3 times daily.  He does not look markedly short of breath at the moment.  Therefore I will reduce this to 60 mg IV daily.  We will await report of 2D echocardiogram.  In addition she has concomitant with hyperkalemia therefore we should check serum cortisol level to make sure it is no underlying adrenal insufficiency.  We will also start the patient on Veltassa 16.8 g p.o. daily.  We may need to consider use of tolvaptan but we will await further laboratory studies before deciding upon this.  Thanks for consultation.    LOS: 1 Domenic Schoenberger 4/21/20191:04 PM

## 2017-12-21 NOTE — Progress Notes (Signed)
Pt has had repeated CBG results indicating hypoglycemia, each time pt has tray at bedside, and is ready to eat as well as 15G carbohydrates given with success. MD notified,  Orders for D10 IV given, will continue to monitor.

## 2017-12-21 NOTE — Progress Notes (Signed)
Hypoglycemic Event  CBG: 55  Treatment: 15 GM carbohydrate snack  Symptoms: None  Follow-up CBG: [id number]:0843 CBG Result:75  Possible Reasons for Event:   Comments/MD notified:    Jerene DillingASHLY D Kensi Karr

## 2017-12-21 NOTE — Progress Notes (Addendum)
[facility] [place] [facility]   PATIENT NAME: Patty Roberts    MR#:  [id number]  DATE OF BIRTH:  19-Mar-1932  SUBJECTIVE:  CHIEF COMPLAINT:   Chief Complaint  Patient presents with  . Wheezing   Shortness of breath is better. On 2 L oxygen.  Family at bedside.  REVIEW OF SYSTEMS:    Review of Systems  Constitutional: Positive for malaise/fatigue. Negative for chills and fever.  HENT: Negative for sore throat.   Eyes: Negative for blurred vision, double vision and pain.  Respiratory: Positive for shortness of breath and wheezing. Negative for cough and hemoptysis.   Cardiovascular: Positive for orthopnea and leg swelling. Negative for chest pain and palpitations.  Gastrointestinal: Negative for abdominal pain, constipation, diarrhea, heartburn, nausea and vomiting.  Genitourinary: Negative for dysuria and hematuria.  Musculoskeletal: Negative for back pain and joint pain.  Skin: Negative for rash.  Neurological: Negative for sensory change, speech change, focal weakness and headaches.  Endo/Heme/Allergies: Does not bruise/bleed easily.  Psychiatric/Behavioral: Negative for depression. The patient is not nervous/anxious.     DRUG ALLERGIES:   Allergies  Allergen Reactions  . Phenameth [Promethazine]   . Statins Other (See Comments)    Leg pain     VITALS:  Blood pressure (!) 150/61, pulse 70, temperature 97.6 F (36.4 C), temperature source Oral, resp. rate 20, height 5\' 2"  (1.575 m), weight 97.9 kg (215 lb 14.4 oz), SpO2 97 %.  PHYSICAL EXAMINATION:   Physical Exam  GENERAL:  84 y.o.-year-old patient lying in the bed  EYES: Pupils equal, round, reactive to light and accommodation. No scleral icterus. Extraocular muscles intact.  HEENT: Head atraumatic, normocephalic. Oropharynx and nasopharynx clear.  NECK:  Supple, no jugular venous distention. No thyroid enlargement, no tenderness.  LUNGS: Normal breath sounds bilaterally, no wheezing, rales,  rhonchi. No use of accessory muscles of respiration.  CARDIOVASCULAR: S1, S2 normal. No murmurs, rubs, or gallops.  ABDOMEN: Soft, nontender, nondistended. Bowel sounds present. No organomegaly or mass.  EXTREMITIES: No cyanosis, clubbing or edema b/l.    NEUROLOGIC: Cranial nerves II through XII are intact. No focal Motor or sensory deficits b/l.   PSYCHIATRIC: The patient is alert and oriented x 3.  SKIN: No obvious rash, lesion, or ulcer.   LABORATORY PANEL:   CBC Recent Labs  Lab 12/21/17 0430  WBC 8.7  HGB 10.9*  HCT 32.0*  PLT 349   ------------------------------------------------------------------------------------------------------------------ Chemistries  Recent Labs  Lab 12/20/17 1539 12/21/17 0430  NA 120* 119*  K 5.4* 6.0*  CL 88* 86*  CO2 21* 24  GLUCOSE 209* 127*  BUN 38* 48*  CREATININE 1.92* 2.20*  CALCIUM 8.3* 8.2*  MG 1.7  --    ------------------------------------------------------------------------------------------------------------------  Cardiac Enzymes Recent Labs  Lab 12/20/17 1539  TROPONINI 0.03*   ------------------------------------------------------------------------------------------------------------------  RADIOLOGY:  Dg Chest 2 View  Result Date: 12/20/2017 CLINICAL DATA:  84 year old female with shortness of breath EXAM: CHEST - 2 VIEW COMPARISON:  Prior chest x-ray 06/19/2016 FINDINGS: Interval worsening of pulmonary vascular congestion now with moderate interstitial pulmonary edema. Stable cardiomegaly. Left subclavian approach cardiac rhythm maintenance device remains in unchanged position with 3 leads overlying the heart, 1 in the right atrium and 2 in the right ventricle. No evidence of pneumothorax. Probable small bilateral pleural effusions. No acute osseous abnormality. IMPRESSION: Interval development of moderate CHF. Electronically Signed   By: Malachy Moan M.D.   On: 12/20/2017 11:55     ASSESSMENT AND PLAN:    *  Severe hyponatremia, hypervolemic.  Will increase dose of Lasix.  Consulted nephrology.  May need tolvaptan but complicated by her CKD.  *New onset congestive heart failure.  No history of CHF and no old echocardiograms available. - IV Lasix, Beta blockers - Input and Output - Counseled to limit fluids and Salt - Monitor Bun/Cr and Potassium - Echo-ordered and pending -Cardiology follow up after discharge  *CKD stage IV.  Monitor while being diuresed.  *Hypertension.  Continue home medications.  *Hyperkalemia.  Should improve with diuresis.  We will also give 1 dose of Kayexalate.  *Hypoglycemia, uncontrolled diabetes mellitus.  Stop glipizide.  Sliding scale insulin.  *DVT prophylaxis with heparin  All the records are reviewed and case discussed with Care Management/Social Worker Management plans discussed with the patient, family and they are in agreement.  CODE STATUS: FULL CODE  DVT Prophylaxis: SCDs  TOTAL CC TIME TAKING CARE OF THIS PATIENT: 35 minutes.   POSSIBLE D/C IN 1-2 DAYS, DEPENDING ON CLINICAL CONDITION.  Orie FishermanSrikar R Foch Rosenwald M.D on 12/21/2017 at 11:29 AM  Between 7am to 6pm - Pager - [phone]  After 6pm go to [email or web address] - password [email or web address]  [facility]  Office  [phone]  CC: Primary care physician; [name], [name], NP  Note: This dictation was prepared with Dragon dictation along with smaller phrase technology. Any transcriptional errors that result from this process are unintentional.

## 2017-12-22 ENCOUNTER — Ambulatory Visit: Payer: Medicare Other | Admitting: Unknown Physician Specialty

## 2017-12-22 LAB — GLUCOSE, CAPILLARY
GLUCOSE-CAPILLARY: 39 mg/dL — AB (ref 65–99)
GLUCOSE-CAPILLARY: 109 mg/dL — AB (ref 65–99)
GLUCOSE-CAPILLARY: 97 mg/dL (ref 65–99)
GLUCOSE-CAPILLARY: 154 mg/dL — AB (ref 65–99)
GLUCOSE-CAPILLARY: 116 mg/dL — AB (ref 65–99)
Glucose-Capillary: 90 mg/dL (ref 65–99)
Glucose-Capillary: 145 mg/dL — ABNORMAL HIGH (ref 65–99)
Glucose-Capillary: 165 mg/dL — ABNORMAL HIGH (ref 65–99)

## 2017-12-22 LAB — SODIUM
SODIUM: 119 mmol/L — AB (ref 135–145)
Sodium: 117 mmol/L — CL (ref 135–145)
Sodium: 119 mmol/L — CL (ref 135–145)

## 2017-12-22 LAB — BASIC METABOLIC PANEL
ANION GAP: 8 (ref 5–15)
Anion gap: 8 (ref 5–15)
BUN: 50 mg/dL — ABNORMAL HIGH (ref 6–20)
BUN: 56 mg/dL — ABNORMAL HIGH (ref 6–20)
CALCIUM: 7.7 mg/dL — AB (ref 8.9–10.3)
CHLORIDE: 84 mmol/L — AB (ref 101–111)
CO2: 26 mmol/L (ref 22–32)
CO2: 26 mmol/L (ref 22–32)
CREATININE: 2.08 mg/dL — AB (ref 0.44–1.00)
Calcium: 7.5 mg/dL — ABNORMAL LOW (ref 8.9–10.3)
Chloride: 86 mmol/L — ABNORMAL LOW (ref 101–111)
Creatinine, Ser: 2.29 mg/dL — ABNORMAL HIGH (ref 0.44–1.00)
GFR calc non Af Amer: 19 mL/min — ABNORMAL LOW (ref >60)
GFR, EST AFRICAN AMERICAN: 24 mL/min — AB (ref >60)
GFR, EST AFRICAN AMERICAN: 22 mL/min — AB (ref >60)
GFR, EST NON AFRICAN AMERICAN: 21 mL/min — AB (ref >60)
Glucose, Bld: 92 mg/dL (ref 65–99)
Glucose, Bld: 123 mg/dL — ABNORMAL HIGH (ref 65–99)
Potassium: 4.5 mmol/L (ref 3.5–5.1)
Potassium: 4.3 mmol/L (ref 3.5–5.1)
Sodium: 120 mmol/L — ABNORMAL LOW (ref 135–145)
Sodium: 118 mmol/L — CL (ref 135–145)

## 2017-12-22 MED ORDER — FUROSEMIDE 10 MG/ML IJ SOLN: 60.0000 mg | Freq: Two times a day (BID) | INTRAMUSCULAR | Status: DC

## 2017-12-22 MED ORDER — TOLVAPTAN 15 MG PO TABS
15.0000 mg | ORAL_TABLET | ORAL | Status: DC
  Administered 2017-12-22: 15 mg via ORAL
  Filled 2017-12-22 (×2): qty 1

## 2017-12-22 NOTE — Care Management (Addendum)
New CHF.  Barrier NA remains low at 117.  RNCM for heart failure screen.  This patient is new to this RNCM.  Full assessment to be completed. PT consult pending

## 2017-12-22 NOTE — Assessment & Plan Note (Signed)
Difficult to tell how much of the current sxs are from this, as she's struggled with anxiety episodes in the past with some similarity in presentation. Pt not wanting to change or add medications at this time, not interested in counseling. Will continue to monitor, lifestyle tactics reviewed for stress and worry relief

## 2017-12-22 NOTE — Progress Notes (Signed)
Patient requested for her blood sugar to be checked as she was wondering if the increased rate of the D10% was working.  Patient's CBG was 90 mg/dL.

## 2017-12-22 NOTE — Assessment & Plan Note (Signed)
Will restart glimepiride and watch closely for hypoglycemic episodes. Recheck sugars as scheduled at upcoming f/u. Dietary handouts given and options reviewed. Will get over to the lifestyle center if handouts aren't helping but they want to try these first.

## 2017-12-22 NOTE — Progress Notes (Signed)
[facility]  ROUNDING NOTE   Subjective:   Family at bedside.   Sodium not improving.   Objective:  Vital signs in last 24 hours:  Temp:  [97.7 F (36.5 C)-98.3 F (36.8 C)] 98.2 F (36.8 C) (04/22 0807) Pulse Rate:  [62-76] 62 (04/22 1025) Resp:  [18-19] 18 (04/22 0418) BP: (115-133)/(51-68) 115/51 (04/22 1025) SpO2:  [91 %-95 %] 95 % (04/22 1006) Weight:  [99.9 kg (220 lb 3.8 oz)] 99.9 kg (220 lb 3.8 oz) (04/22 0821)  Weight change:  Filed Weights   12/20/17 1114 12/21/17 0441 12/22/17 0821  Weight: 95.7 kg (211 lb) 97.9 kg (215 lb 14.4 oz) 99.9 kg (220 lb 3.8 oz)    Intake/Output: I/O last 3 completed shifts: In: 560 [P.O.:120; I.V.:440] Out: 1900 [Urine:1900]   Intake/Output this shift:  No intake/output data recorded.  Physical Exam: General: No acute distress  Head: Normocephalic, atraumatic. Moist oral mucosal membranes  Eyes: Anicteric  Neck: Supple, trachea midline  Lungs:  Clear bilaterally  Heart: S1S2 no rubs  Abdomen:  Soft, nontender, bowel sounds present  Extremities: No peripheral edema.  Neurologic: Awake, alert, following commands  Skin: No lesions       Basic Metabolic Panel: Recent Labs  Lab 12/20/17 1539 12/21/17 0430 12/22/17 0442 12/22/17 0850 12/22/17 1119  NA 120* 119* 120* 119* 118*  K 5.4* 6.0* 4.5  --  4.3  CL 88* 86* 86*  --  84*  CO2 21* 24 26  --  26  GLUCOSE 209* 127* 92  --  123*  BUN 38* 48* 50*  --  56*  CREATININE 1.92* 2.20* 2.08*  --  2.29*  CALCIUM 8.3* 8.2* 7.7*  --  7.5*  MG 1.7  --   --   --   --     Liver Function Tests: No results for input(s): AST, ALT, ALKPHOS, BILITOT, PROT, ALBUMIN in the last 168 hours. No results for input(s): LIPASE, AMYLASE in the last 168 hours. No results for input(s): AMMONIA in the last 168 hours.  CBC: Recent Labs  Lab 12/20/17 1123 12/21/17 0430  WBC 9.5 8.7  HGB 11.7* 10.9*  HCT 34.4* 32.0*  MCV 104.3* 104.8*  PLT 348 349    Cardiac  Enzymes: Recent Labs  Lab 12/20/17 1539  TROPONINI 0.03*    BNP: Invalid input(s): POCBNP  CBG: Recent Labs  Lab 12/22/17 0216 12/22/17 0654 12/22/17 0810 12/22/17 1002 12/22/17 1204  GLUCAP 90 109* 145* 165* 57    Microbiology: No results found for this or any previous visit.  Coagulation Studies: No results for input(s): LABPROT, INR in the last 72 hours.  Urinalysis: No results for input(s): COLORURINE, LABSPEC, PHURINE, GLUCOSEU, HGBUR, BILIRUBINUR, KETONESUR, PROTEINUR, UROBILINOGEN, NITRITE, LEUKOCYTESUR in the last 72 hours.  Invalid input(s): APPERANCEUR    Imaging: Ct Chest Wo Contrast  Result Date: 12/21/2017 CLINICAL DATA:  Pt states increased cough and congestion over the last few days. Pt states she is coughing up some phlegm. Pt states hx of surgery to place pacemaker. EXAM: CT CHEST WITHOUT CONTRAST TECHNIQUE: Multidetector CT imaging of the chest was performed following the standard protocol without IV contrast. COMPARISON:  Chest x-ray 12/29/2012 FINDINGS: Cardiovascular: The heart is enlarged. There is LEFT-sided transvenous pacemaker with leads to the RIGHT atrium, RIGHT ventricle. No pericardial effusion. There is 3 vessel atherosclerotic calcification. Thoracic aorta is moderately calcified. Bovine arch anatomy. No aneurysm. The pulmonary artery is mildly prominent at 3.7 centimeters. Mediastinum/Nodes: The visualized portion of  the thyroid gland has a normal appearance. No mediastinal, hilar, or axillary adenopathy. Lungs/Pleura: There are focal areas of atelectasis in the dependent aspects the lungs. No suspicious pulmonary nodule. Mild bibasilar atelectasis. Small RIGHT pleural effusion. Upper Abdomen: Small hiatal hernia.  No acute abnormality. Musculoskeletal: Chronic compression of T11, T12, and L1 associated osteophytes. No suspicious lytic or blastic lesions are identified. IMPRESSION: A 1. Cardiomegaly without pulmonary edema. 2.  Aortic  atherosclerosis.  (ICD10-I70.0) 3. Bilateral LOWER lobe atelectasis and small RIGHT pleural effusion. 4. Small hiatal hernia. 5. Chronic compression fractures of the thoracic and lumbar spine. Electronically Signed   By: Nolon Nations M.D.   On: 12/21/2017 13:15     Medications:   . sodium chloride     . amLODipine  10 mg Oral Daily  . carvedilol  12.5 mg Oral BID WC  . cloNIDine  0.3 mg Transdermal Weekly  . heparin  5,000 Units Subcutaneous Q8H  . hydrALAZINE  50 mg Oral TID  . insulin aspart  0-5 Units Subcutaneous QHS  . insulin aspart  0-9 Units Subcutaneous TID WC  . patiromer  16.8 g Oral Daily  . rosuvastatin  5 mg Oral Daily  . sodium chloride flush  3 mL Intravenous Q12H  . tolvaptan  15 mg Oral Q24H   sodium chloride, acetaminophen **OR** acetaminophen, albuterol, benzonatate, bisacodyl, clonazePAM, diphenhydrAMINE, HYDROcodone-acetaminophen, meclizine, ondansetron **OR** ondansetron (ZOFRAN) IV, senna-docusate, sodium chloride flush  Assessment/ Plan:  84 y.o. black female with history of diabetes, hypertension, hypercholesterolemia and chronic kidney disease stage III, baseline Cr 1.8, egfr 29.   1.  Acute renal failure/CKD stage IV baseline EGFR 29.  2.  Hyponatremia. 3.  Diastolic congestive heart failure - with acute exacerbation 4.  Hypertension.  5.  Hyperkalemia, K 6.0 yesterday. At goal status post Veltassa 6. Diabetes mellitus type II with hypoglycemia overnight  Plan:  Tolvaptan 52m PO daily.  Fluid restriction Serial sodium checks.  Currently holding furosemide. Echo reviewed.  Discontinued Dextrose solution. Do not recommend restarting glimepiride    LOS: 2 Chuckie Mccathern 4/22/20192:55 PM

## 2017-12-22 NOTE — Progress Notes (Addendum)
Pt's CBG was 62 mg/dL this evening, despite patient being on IV D10% at 20 cc/hr.  Patient remains asymptomatic.  On-call MD text-paged for increase in rate.

## 2017-12-22 NOTE — Progress Notes (Signed)
Dr. Wynelle LinkKolluru aware of Na 117. No new orders at this time. Will continue to monitor - patient getting frequent sodium lab draws.

## 2017-12-22 NOTE — Progress Notes (Signed)
[employer] - [place] at [facility]   PATIENT NAME: Patty Roberts    MR#:  [id number]  DATE OF BIRTH:  09/25/1931  SUBJECTIVE:  CHIEF COMPLAINT:   Chief Complaint  Patient presents with  . Wheezing   Shortness of breath is much improved.  Off the oxygen.  Has chest congestion and wheezing.  REVIEW OF SYSTEMS:    Review of Systems  Constitutional: Positive for malaise/fatigue. Negative for chills and fever.  HENT: Negative for sore throat.   Eyes: Negative for blurred vision, double vision and pain.  Respiratory: Positive for shortness of breath and wheezing. Negative for cough and hemoptysis.   Cardiovascular: Positive for orthopnea and leg swelling. Negative for chest pain and palpitations.  Gastrointestinal: Negative for abdominal pain, constipation, diarrhea, heartburn, nausea and vomiting.  Genitourinary: Negative for dysuria and hematuria.  Musculoskeletal: Negative for back pain and joint pain.  Skin: Negative for rash.  Neurological: Negative for sensory change, speech change, focal weakness and headaches.  Endo/Heme/Allergies: Does not bruise/bleed easily.  Psychiatric/Behavioral: Negative for depression. The patient is not nervous/anxious.     DRUG ALLERGIES:   Allergies  Allergen Reactions  . Phenameth [Promethazine]   . Statins Other (See Comments)    Leg pain     VITALS:  Blood pressure (!) 115/51, pulse 62, temperature 98.2 F (36.8 C), temperature source Oral, resp. rate 18, height 5\' 2"  (1.575 m), weight 99.9 kg (220 lb 3.8 oz), SpO2 95 %.  PHYSICAL EXAMINATION:   Physical Exam  GENERAL:  84 y.o.-year-old patient lying in the bed. EYES: Pupils equal, round, reactive to light and accommodation. No scleral icterus. Extraocular muscles intact.  HEENT: Head atraumatic, normocephalic. Oropharynx and nasopharynx clear.  NECK:  Supple, no jugular venous distention. No thyroid enlargement, no tenderness.  LUNGS: Mild wheezing  bilaterally CARDIOVASCULAR: S1, S2 normal. No murmurs, rubs, or gallops.  ABDOMEN: Soft, nontender, nondistended. Bowel sounds present. No organomegaly or mass.  EXTREMITIES: No cyanosis, clubbing or edema b/l.    NEUROLOGIC: Cranial nerves II through XII are intact. No focal Motor or sensory deficits b/l.   PSYCHIATRIC: The patient is alert and oriented x 3.  SKIN: No obvious rash, lesion, or ulcer.   LABORATORY PANEL:   CBC Recent Labs  Lab 12/21/17 0430  WBC 8.7  HGB 10.9*  HCT 32.0*  PLT 349   ------------------------------------------------------------------------------------------------------------------ Chemistries  Recent Labs  Lab 12/20/17 1539  12/22/17 0442 12/22/17 0850  NA 120*   < > 120* 119*  K 5.4*   < > 4.5  --   CL 88*   < > 86*  --   CO2 21*   < > 26  --   GLUCOSE 209*   < > 92  --   BUN 38*   < > 50*  --   CREATININE 1.92*   < > 2.08*  --   CALCIUM 8.3*   < > 7.7*  --   MG 1.7  --   --   --    < > = values in this interval not displayed.   ------------------------------------------------------------------------------------------------------------------  Cardiac Enzymes Recent Labs  Lab 12/20/17 1539  TROPONINI 0.03*   ------------------------------------------------------------------------------------------------------------------  RADIOLOGY:  Dg Chest 2 View  Result Date: 12/20/2017 CLINICAL DATA:  84 year old female with shortness of breath EXAM: CHEST - 2 VIEW COMPARISON:  Prior chest x-ray 06/19/2016 FINDINGS: Interval worsening of pulmonary vascular congestion now with moderate interstitial pulmonary edema. Stable cardiomegaly. Left subclavian approach cardiac rhythm maintenance device  remains in unchanged position with 3 leads overlying the heart, 1 in the right atrium and 2 in the right ventricle. No evidence of pneumothorax. Probable small bilateral pleural effusions. No acute osseous abnormality. IMPRESSION: Interval development of  moderate CHF. Electronically Signed   By: Malachy Moan M.D.   On: 12/20/2017 11:55   Ct Chest Wo Contrast  Result Date: 12/21/2017 CLINICAL DATA:  Pt states increased cough and congestion over the last few days. Pt states she is coughing up some phlegm. Pt states hx of surgery to place pacemaker. EXAM: CT CHEST WITHOUT CONTRAST TECHNIQUE: Multidetector CT imaging of the chest was performed following the standard protocol without IV contrast. COMPARISON:  Chest x-ray 12/29/2012 FINDINGS: Cardiovascular: The heart is enlarged. There is LEFT-sided transvenous pacemaker with leads to the RIGHT atrium, RIGHT ventricle. No pericardial effusion. There is 3 vessel atherosclerotic calcification. Thoracic aorta is moderately calcified. Bovine arch anatomy. No aneurysm. The pulmonary artery is mildly prominent at 3.7 centimeters. Mediastinum/Nodes: The visualized portion of the thyroid gland has a normal appearance. No mediastinal, hilar, or axillary adenopathy. Lungs/Pleura: There are focal areas of atelectasis in the dependent aspects the lungs. No suspicious pulmonary nodule. Mild bibasilar atelectasis. Small RIGHT pleural effusion. Upper Abdomen: Small hiatal hernia.  No acute abnormality. Musculoskeletal: Chronic compression of T11, T12, and L1 associated osteophytes. No suspicious lytic or blastic lesions are identified. IMPRESSION: A 1. Cardiomegaly without pulmonary edema. 2.  Aortic atherosclerosis.  (ICD10-I70.0) 3. Bilateral LOWER lobe atelectasis and small RIGHT pleural effusion. 4. Small hiatal hernia. 5. Chronic compression fractures of the thoracic and lumbar spine. Electronically Signed   By: Norva Pavlov M.D.   On: 12/21/2017 13:15     ASSESSMENT AND PLAN:   *Severe hyponatremia, hypervolemic.  Patient diuresed with Lasix.  Now Lasix has been stopped.  We will also stop D10 that was being used for hypoglycemia. Sodium has not improved.  Discussed with Dr. Wynelle Link of nephrology who will see  the patient.  *New onset congestive heart failure.  No history of CHF and no old echocardiograms available. -Patient was on IV Lasix.  Stopped. - Input and Output - Counseled to limit fluids and Salt - Monitor Bun/Cr and Potassium - Echo-normal ejection fraction. -Cardiology follow up after discharge  *CKD stage IV.  Monitor while being diuresed.  *Hypertension.  Continue home medications.  *Hyperkalemia.  Should improve with diuresis.  We will also give 1 dose of Kayexalate.  *Hypoglycemia, uncontrolled diabetes mellitus.  Stopped glipizide.  Will stop D10.  Repeat Accu-Chek.  Accu-Cheks before meals at bedtime.  *DVT prophylaxis with heparin  All the records are reviewed and case discussed with Care Management/Social Worker Management plans discussed with the patient, family and they are in agreement.  CODE STATUS: FULL CODE  TOTAL TIME TAKING CARE OF THIS PATIENT: 35 minutes.   POSSIBLE D/C IN 2-3 DAYS, DEPENDING ON CLINICAL CONDITION.  Molinda Bailiff Eldredge Veldhuizen M.D on 12/22/2017 at 11:00 AM  Between 7am to 6pm - Pager - [phone]  After 6pm go to [email or web address] - password [email or web address]  [facility] [place] [facility]  Office  [phone]  CC: Primary care physician; Gabriel Cirri, NP  Note: This dictation was prepared with Dragon dictation along with smaller phrase technology. Any transcriptional errors that result from this process are unintentional.

## 2017-12-22 NOTE — Assessment & Plan Note (Signed)
Followed by Nephrology.

## 2017-12-23 LAB — SODIUM
SODIUM: 119 mmol/L — AB (ref 135–145)
SODIUM: 118 mmol/L — AB (ref 135–145)
Sodium: 118 mmol/L — CL (ref 135–145)
Sodium: 117 mmol/L — CL (ref 135–145)
Sodium: 117 mmol/L — CL (ref 135–145)
Sodium: 121 mmol/L — ABNORMAL LOW (ref 135–145)

## 2017-12-23 LAB — BASIC METABOLIC PANEL
ANION GAP: 8 (ref 5–15)
BUN: 57 mg/dL — ABNORMAL HIGH (ref 6–20)
CALCIUM: 7.6 mg/dL — AB (ref 8.9–10.3)
CO2: 26 mmol/L (ref 22–32)
Chloride: 83 mmol/L — ABNORMAL LOW (ref 101–111)
Creatinine, Ser: 2.41 mg/dL — ABNORMAL HIGH (ref 0.44–1.00)
GFR calc Af Amer: 20 mL/min — ABNORMAL LOW (ref >60)
GFR, EST NON AFRICAN AMERICAN: 17 mL/min — AB (ref >60)
Glucose, Bld: 83 mg/dL (ref 65–99)
POTASSIUM: 4.5 mmol/L (ref 3.5–5.1)
Sodium: 117 mmol/L — CL (ref 135–145)

## 2017-12-23 LAB — GLUCOSE, CAPILLARY
GLUCOSE-CAPILLARY: 140 mg/dL — AB (ref 65–99)
GLUCOSE-CAPILLARY: 148 mg/dL — AB (ref 65–99)
Glucose-Capillary: 96 mg/dL (ref 65–99)
Glucose-Capillary: 142 mg/dL — ABNORMAL HIGH (ref 65–99)

## 2017-12-23 LAB — OSMOLALITY: OSMOLALITY: 267 mosm/kg — AB (ref 275–295)

## 2017-12-23 LAB — BRAIN NATRIURETIC PEPTIDE: B NATRIURETIC PEPTIDE 5: 300 pg/mL — AB (ref 0.0–100.0)

## 2017-12-23 MED ORDER — POLYETHYLENE GLYCOL 3350 17 G PO PACK
17.0000 g | PACK | Freq: Two times a day (BID) | ORAL | Status: DC
  Administered 2017-12-23 (×2): 17 g via ORAL
  Filled 2017-12-23 (×3): qty 1

## 2017-12-23 MED ORDER — SODIUM CHLORIDE 3 % IV SOLN
INTRAVENOUS | Status: DC
  Administered 2017-12-23 (×2): 50 mL/h via INTRAVENOUS
  Filled 2017-12-23 (×3): qty 500

## 2017-12-23 NOTE — Care Management (Addendum)
Patient NA remains low at 119.  Patient lives at home with her 2 adult daughters.  Daughters provide transportation when needed.  PCP wicker.  Patient states that at baseline she is independent, and has no medical equipment.  Obtains her medications from [facility] on [address].  PT consult pending.  RNCM following for home health needs, including heart failure protocol.  Patient agreeable to home health services if appropriate, and choice is [facility]. Jason with [facility] heads up referral

## 2017-12-23 NOTE — Progress Notes (Signed)
[facility]  ROUNDING NOTE   Subjective:   Daughter at bedside.   Sodium 117. Tolvaptan yesterday.   Objective:  Vital signs in last 24 hours:  Temp:  [98 F (36.7 C)-98.3 F (36.8 C)] 98 F (36.7 C) (04/23 0932) Pulse Rate:  [60-74] 70 (04/23 0932) Resp:  [18] 18 (04/23 0932) BP: (109-140)/(50-72) 120/54 (04/23 0932) SpO2:  [88 %-97 %] 94 % (04/23 0932) Weight:  [97.4 kg (214 lb 12.8 oz)] 97.4 kg (214 lb 12.8 oz) (04/23 0329)  Weight change:  Filed Weights   12/21/17 0441 12/22/17 0821 12/23/17 0329  Weight: 97.9 kg (215 lb 14.4 oz) 99.9 kg (220 lb 3.8 oz) 97.4 kg (214 lb 12.8 oz)    Intake/Output: I/O last 3 completed shifts: In: 440 [I.V.:440] Out: 500 [Urine:500]   Intake/Output this shift:  Total I/O In: 3 [I.V.:3] Out: -   Physical Exam: General: No acute distress  Head: Normocephalic, atraumatic. Moist oral mucosal membranes  Eyes: Anicteric  Neck: Supple, trachea midline  Lungs:  Clear bilaterally  Heart: S1S2 no rubs  Abdomen:  Soft, nontender, bowel sounds present  Extremities: No peripheral edema.  Neurologic: Awake, alert, following commands  Skin: No lesions       Basic Metabolic Panel: Recent Labs  Lab 12/20/17 1539 12/21/17 0430 12/22/17 0442  12/22/17 1119  12/22/17 1912 12/23/17 0022 12/23/17 0332 12/23/17 0836 12/23/17 1042  NA 120* 119* 120*   < > 118*   < > 119* 118* 118*  117* 117* 117*  K 5.4* 6.0* 4.5  --  4.3  --   --   --  4.5  --   --   CL 88* 86* 86*  --  84*  --   --   --  83*  --   --   CO2 21* 24 26  --  26  --   --   --  26  --   --   GLUCOSE 209* 127* 92  --  123*  --   --   --  83  --   --   BUN 38* 48* 50*  --  56*  --   --   --  57*  --   --   CREATININE 1.92* 2.20* 2.08*  --  2.29*  --   --   --  2.41*  --   --   CALCIUM 8.3* 8.2* 7.7*  --  7.5*  --   --   --  7.6*  --   --   MG 1.7  --   --   --   --   --   --   --   --   --   --    < > = values in this interval not displayed.    Liver  Function Tests: No results for input(s): AST, ALT, ALKPHOS, BILITOT, PROT, ALBUMIN in the last 168 hours. No results for input(s): LIPASE, AMYLASE in the last 168 hours. No results for input(s): AMMONIA in the last 168 hours.  CBC: Recent Labs  Lab 12/20/17 1123 12/21/17 0430  WBC 9.5 8.7  HGB 11.7* 10.9*  HCT 34.4* 32.0*  MCV 104.3* 104.8*  PLT 348 349    Cardiac Enzymes: Recent Labs  Lab 12/20/17 1539  TROPONINI 0.03*    BNP: Invalid input(s): POCBNP  CBG: Recent Labs  Lab 12/22/17 1002 12/22/17 1204 12/22/17 1645 12/22/17 2107 12/23/17 0744  GLUCAP 165* 97 154* 116* 96  Microbiology: No results found for this or any previous visit.  Coagulation Studies: No results for input(s): LABPROT, INR in the last 72 hours.  Urinalysis: No results for input(s): COLORURINE, LABSPEC, PHURINE, GLUCOSEU, HGBUR, BILIRUBINUR, KETONESUR, PROTEINUR, UROBILINOGEN, NITRITE, LEUKOCYTESUR in the last 72 hours.  Invalid input(s): APPERANCEUR    Imaging: Ct Chest Wo Contrast  Result Date: 12/21/2017 CLINICAL DATA:  Pt states increased cough and congestion over the last few days. Pt states she is coughing up some phlegm. Pt states hx of surgery to place pacemaker. EXAM: CT CHEST WITHOUT CONTRAST TECHNIQUE: Multidetector CT imaging of the chest was performed following the standard protocol without IV contrast. COMPARISON:  Chest x-ray 12/29/2012 FINDINGS: Cardiovascular: The heart is enlarged. There is LEFT-sided transvenous pacemaker with leads to the RIGHT atrium, RIGHT ventricle. No pericardial effusion. There is 3 vessel atherosclerotic calcification. Thoracic aorta is moderately calcified. Bovine arch anatomy. No aneurysm. The pulmonary artery is mildly prominent at 3.7 centimeters. Mediastinum/Nodes: The visualized portion of the thyroid gland has a normal appearance. No mediastinal, hilar, or axillary adenopathy. Lungs/Pleura: There are focal areas of atelectasis in the dependent  aspects the lungs. No suspicious pulmonary nodule. Mild bibasilar atelectasis. Small RIGHT pleural effusion. Upper Abdomen: Small hiatal hernia.  No acute abnormality. Musculoskeletal: Chronic compression of T11, T12, and L1 associated osteophytes. No suspicious lytic or blastic lesions are identified. IMPRESSION: A 1. Cardiomegaly without pulmonary edema. 2.  Aortic atherosclerosis.  (ICD10-I70.0) 3. Bilateral LOWER lobe atelectasis and small RIGHT pleural effusion. 4. Small hiatal hernia. 5. Chronic compression fractures of the thoracic and lumbar spine. Electronically Signed   By: Nolon Nations M.D.   On: 12/21/2017 13:15     Medications:   . sodium chloride    . sodium chloride (hypertonic)     . amLODipine  10 mg Oral Daily  . carvedilol  12.5 mg Oral BID WC  . cloNIDine  0.3 mg Transdermal Weekly  . heparin  5,000 Units Subcutaneous Q8H  . hydrALAZINE  50 mg Oral TID  . insulin aspart  0-5 Units Subcutaneous QHS  . insulin aspart  0-9 Units Subcutaneous TID WC  . polyethylene glycol  17 g Oral BID  . rosuvastatin  5 mg Oral Daily  . sodium chloride flush  3 mL Intravenous Q12H   sodium chloride, acetaminophen **OR** acetaminophen, albuterol, benzonatate, bisacodyl, clonazePAM, diphenhydrAMINE, HYDROcodone-acetaminophen, meclizine, ondansetron **OR** ondansetron (ZOFRAN) IV, senna-docusate, sodium chloride flush  Assessment/ Plan:  84 y.o. black female with history of diabetes, hypertension, hypercholesterolemia and chronic kidney disease stage III, baseline Cr 1.8, egfr 29.   1.  Acute renal failure/CKD stage IV baseline EGFR 29.  2.  Hyponatremia. 3.  Diastolic congestive heart failure - with acute exacerbation 4.  Hypertension.  5.  Hyperkalemia- discontinue veltassa 6. Diabetes mellitus type II with chronic kidney disease.  Plan:  Start hypertonic saline. Patient is currently asymptomatic.    LOS: 3 Patty Roberts 4/23/201911:20 AM

## 2017-12-23 NOTE — Progress Notes (Signed)
[facility] -  at [facility]   PATIENT NAME: Patty Roberts    MR#:  [id number]  DATE OF BIRTH:  [birth year]  SUBJECTIVE:  CHIEF COMPLAINT:   Chief Complaint  Patient presents with  . Wheezing   Shortness of breath has resolved.  Sodium continues to be low. Afebrile.  Off oxygen  REVIEW OF SYSTEMS:    Review of Systems  Constitutional: Positive for malaise/fatigue. Negative for chills and fever.  HENT: Negative for sore throat.   Eyes: Negative for blurred vision, double vision and pain.  Respiratory: Positive for shortness of breath and wheezing. Negative for cough and hemoptysis.   Cardiovascular: Positive for orthopnea and leg swelling. Negative for chest pain and palpitations.  Gastrointestinal: Negative for abdominal pain, constipation, diarrhea, heartburn, nausea and vomiting.  Genitourinary: Negative for dysuria and hematuria.  Musculoskeletal: Negative for back pain and joint pain.  Skin: Negative for rash.  Neurological: Negative for sensory change, speech change, focal weakness and headaches.  Endo/Heme/Allergies: Does not bruise/bleed easily.  Psychiatric/Behavioral: Negative for depression. The patient is not nervous/anxious.     DRUG ALLERGIES:   Allergies  Allergen Reactions  . Phenameth [Promethazine]   . Statins Other (See Comments)    Leg pain     VITALS:  Blood pressure (!) 120/54, pulse 70, temperature 98 F (36.7 C), temperature source Oral, resp. rate 18, height 5\' 2"  (1.575 m), weight 97.4 kg (214 lb 12.8 oz), SpO2 94 %.  PHYSICAL EXAMINATION:   Physical Exam  GENERAL:  84 y.o.-year-old patient lying in the bed. EYES: Pupils equal, round, reactive to light and accommodation. No scleral icterus. Extraocular muscles intact.  HEENT: Head atraumatic, normocephalic. Oropharynx and nasopharynx clear.  NECK:  Supple, no jugular venous distention. No thyroid enlargement, no tenderness.  LUNGS: Clear CARDIOVASCULAR: S1, S2  normal. No murmurs, rubs, or gallops.  ABDOMEN: Soft, nontender, nondistended. Bowel sounds present. No organomegaly or mass.  EXTREMITIES: No cyanosis, clubbing or edema b/l.    NEUROLOGIC: Cranial nerves II through XII are intact. No focal Motor or sensory deficits b/l.   PSYCHIATRIC: The patient is alert and oriented x 3.  SKIN: No obvious rash, lesion, or ulcer.   LABORATORY PANEL:   CBC Recent Labs  Lab 12/21/17 0430  WBC 8.7  HGB 10.9*  HCT 32.0*  PLT 349   ------------------------------------------------------------------------------------------------------------------ Chemistries  Recent Labs  Lab 12/20/17 1539  12/23/17 0332  12/23/17 1042  NA 120*   < > 118*  117*   < > 117*  K 5.4*   < > 4.5  --   --   CL 88*   < > 83*  --   --   CO2 21*   < > 26  --   --   GLUCOSE 209*   < > 83  --   --   BUN 38*   < > 57*  --   --   CREATININE 1.92*   < > 2.41*  --   --   CALCIUM 8.3*   < > 7.6*  --   --   MG 1.7  --   --   --   --    < > = values in this interval not displayed.   ------------------------------------------------------------------------------------------------------------------  Cardiac Enzymes Recent Labs  Lab 12/20/17 1539  TROPONINI 0.03*   ------------------------------------------------------------------------------------------------------------------  RADIOLOGY:  No results found.   ASSESSMENT AND PLAN:   *Severe hyponatremia, hypervolemic.  Initially patient was on Lasix which has been  stopped.  Did not improved with tolvaptan.  Patient is being started on 3% saline today.  Monitor sodium levels closely.  Discussed with nephrology Dr. Wynelle LinkKolluru.  * New onset congestive heart failure.  No history of CHF and no old echocardiograms available. - Patient was on IV Lasix.  Stopped. - Input and Output - Counseled to limit fluids and Salt - Monitor Bun/Cr and Potassium - Echo-normal ejection fraction. -Cardiology follow up after discharge  *  CKD stage IV.  Monitor while being diuresed.  * Hypertension.  Continue home medications.  * Hyperkalemia.  Resolved  * Hypoglycemia, uncontrolled diabetes mellitus.  Stopped glipizide.  Was on D10 for a day and now stopped.  * DVT prophylaxis with heparin  All the records are reviewed and case discussed with Care Management/Social Worker Management plans discussed with the patient, family and they are in agreement.  CODE STATUS: FULL CODE  TOTAL TIME TAKING CARE OF THIS PATIENT: 35 minutes.   POSSIBLE D/C IN 2-3 DAYS, DEPENDING ON CLINICAL CONDITION.  Orie FishermanSrikar R Chantille Navarrete M.D on 12/23/2017 at 12:46 PM  Between 7am to 6pm - Pager - [phone]  After 6pm go to [email or web address] - password [email or web address]  [facility] [place] [facility]  Office  [phone]  CC: Primary care physician; [name], [name], NP  Note: This dictation was prepared with Dragon dictation along with smaller phrase technology. Any transcriptional errors that result from this process are unintentional.

## 2017-12-23 NOTE — Progress Notes (Signed)
PT Cancellation Note  Patient Details Name: Cheri Fowlerrma W Delisi MRN: [id number] DOB: 01/23/1932   Cancelled Treatment:    Reason Eval/Treat Not Completed: Medical issues which prohibited therapy Pt with critically low Sodium that is not upward trending.  Will hold PT this date and assess tomorrow if appropriate.   Malachi ProGalen R Liyla Radliff, DPT 12/23/2017, 11:10 AM

## 2017-12-23 NOTE — Progress Notes (Signed)
CRITICAL VALUE ALERT  Critical Value: sodium 117  Date & Time Notied:  12/23/17 0785  Provider Notified: MD Sudini   Orders Received/Actions taken: No new orders

## 2017-12-23 NOTE — Progress Notes (Signed)
MEDICATION RELATED CONSULT NOTE - INITIAL   Pharmacy Consult for sodium monitoring Indication: hypertonic saline  Allergies  Allergen Reactions  . Phenameth [Promethazine]   . Statins Other (See Comments)    Leg pain     Patient Measurements: Height: 5\' 2"  (157.5 cm) Weight: 214 lb 12.8 oz (97.4 kg) IBW/kg (Calculated) : 50.1 Adjusted Body Weight:   Vital Signs: Temp: 98 F (36.7 C) (04/23 0932) Temp Source: Oral (04/23 0932) BP: 120/54 (04/23 0932) Pulse Rate: 70 (04/23 0932) Intake/Output from previous day: 04/22 0701 - 04/23 0700 In: -  Out: 500 [Urine:500] Intake/Output from this shift: Total I/O In: 3 [I.V.:3] Out: -   Labs: Recent Labs    12/20/17 1123  12/20/17 1539 12/21/17 0430 12/22/17 0442 12/22/17 1119 12/23/17 0332  WBC 9.5  --   --  8.7  --   --   --   HGB 11.7*  --   --  10.9*  --   --   --   HCT 34.4*  --   --  32.0*  --   --   --   PLT 348  --   --  349  --   --   --   CREATININE  --    < > 1.92* 2.20* 2.08* 2.29* 2.41*  MG  --   --  1.7  --   --   --   --    < > = values in this interval not displayed.   Estimated Creatinine Clearance: 19.3 mL/min (A) (by C-G formula based on SCr of 2.41 mg/dL (H)).   Microbiology: No results found for this or any previous visit (from the past 720 hour(s)).  Medical History: Past Medical History:  Diagnosis Date  . Chronic kidney disease   . Diabetes mellitus without complication (HCC)   . GERD (gastroesophageal reflux disease)   . Hyperlipidemia   . Hypertension     Medications:  Infusions:  . sodium chloride    . sodium chloride (hypertonic)      Assessment: 85 yof cc wheezing with severe hyponatremia/hypervolemia and new onset HF. Patient received 1 dose of tolvaptan 15 mg po yesterday. Patient currently has PIV right rist 22 g. Infuse slowly until (if) CVC placed.  Goal of Therapy:  Na 135 to 145 Rate of correction not to exceed 8 mEq/L in the first 8 hours or 12 mEq/24 hours due to  risk of osmotic demyelination.   Plan:  Start sodium chloride 3% IV at 50 mL/hr as ordered by nephrologist. Pharmacy will follow Q8H sodiums as ordered.   Carola FrostNathan A Taccara Bushnell, Pharm.D., BCPS Clinical Pharmacist 12/23/2017,10:07 AM

## 2017-12-23 NOTE — Care Management Important Message (Signed)
Copy of signed IM left in patient's room.    

## 2017-12-23 NOTE — Progress Notes (Signed)
MEDICATION RELATED CONSULT NOTE - INITIAL   Pharmacy Consult for sodium monitoring Indication: hypertonic saline  Allergies  Allergen Reactions  . Phenameth [Promethazine]   . Statins Other (See Comments)    Leg pain     Patient Measurements: Height: 5\' 2"  (157.5 cm) Weight: 214 lb 12.8 oz (97.4 kg) IBW/kg (Calculated) : 50.1 Adjusted Body Weight:   Vital Signs: Temp: 98 F (36.7 C) (04/23 0932) Temp Source: Oral (04/23 0932) BP: 120/54 (04/23 0932) Pulse Rate: 70 (04/23 0932) Intake/Output from previous day: 04/22 0701 - 04/23 0700 In: -  Out: 500 [Urine:500] Intake/Output from this shift: Total I/O In: 243 [P.O.:240; I.V.:3] Out: -   Labs: Recent Labs    12/20/17 1539 12/21/17 0430 12/22/17 0442 12/22/17 1119 12/23/17 0332  WBC  --  8.7  --   --   --   HGB  --  10.9*  --   --   --   HCT  --  32.0*  --   --   --   PLT  --  349  --   --   --   CREATININE 1.92* 2.20* 2.08* 2.29* 2.41*  MG 1.7  --   --   --   --    Estimated Creatinine Clearance: 19.3 mL/min (A) (by C-G formula based on SCr of 2.41 mg/dL (H)).   Microbiology: No results found for this or any previous visit (from the past 720 hour(s)).  Medical History: Past Medical History:  Diagnosis Date  . Chronic kidney disease   . Diabetes mellitus without complication (HCC)   . GERD (gastroesophageal reflux disease)   . Hyperlipidemia   . Hypertension     Medications:  Infusions:  . sodium chloride    . sodium chloride (hypertonic) 50 mL/hr (12/23/17 1138)    Assessment: 85 yof cc wheezing with severe hyponatremia/hypervolemia and new onset HF. Patient received 1 dose of tolvaptan 15 mg po yesterday. Patient currently has PIV right rist 22 g. Infuse slowly until (if) CVC placed.  Goal of Therapy:  Na 135 to 145 Rate of correction not to exceed 8 mEq/L in the first 8 hours or 12 mEq/24 hours due to risk of osmotic demyelination.   Plan:  Drip was not started until 1138. Na level was  drawn at 1313. Resulted @ 119. Baseline was 117. Continue sodium chloride 3% IV at 50 mL/hr as ordered by nephrologist. Pharmacy will follow Q4H sodiums as ordered.  Will call nephrology if Na is rising too fast  Nyelah Emmerich D Infinity Jeffords, Pharm.D., BCPS Clinical Pharmacist 12/23/2017,2:31 PM

## 2017-12-24 LAB — BASIC METABOLIC PANEL
Anion gap: 7 (ref 5–15)
BUN: 57 mg/dL — AB (ref 6–20)
CALCIUM: 7.6 mg/dL — AB (ref 8.9–10.3)
CO2: 25 mmol/L (ref 22–32)
CREATININE: 2.29 mg/dL — AB (ref 0.44–1.00)
Chloride: 90 mmol/L — ABNORMAL LOW (ref 101–111)
GFR, EST AFRICAN AMERICAN: 22 mL/min — AB (ref >60)
GFR, EST NON AFRICAN AMERICAN: 19 mL/min — AB (ref >60)
Glucose, Bld: 129 mg/dL — ABNORMAL HIGH (ref 65–99)
Potassium: 5.1 mmol/L (ref 3.5–5.1)
SODIUM: 122 mmol/L — AB (ref 135–145)

## 2017-12-24 LAB — GLUCOSE, CAPILLARY
Glucose-Capillary: 97 mg/dL (ref 65–99)
Glucose-Capillary: 195 mg/dL — ABNORMAL HIGH (ref 65–99)

## 2017-12-24 LAB — OSMOLALITY, URINE: Osmolality, Ur: 238 mOsm/kg — ABNORMAL LOW (ref 300–900)

## 2017-12-24 LAB — SODIUM
SODIUM: 123 mmol/L — AB (ref 135–145)
SODIUM: 129 mmol/L — AB (ref 135–145)
Sodium: 126 mmol/L — ABNORMAL LOW (ref 135–145)

## 2017-12-24 LAB — SODIUM, URINE, RANDOM: Sodium, Ur: <10 mmol/L

## 2017-12-24 MED ORDER — POLYETHYLENE GLYCOL 3350 17 G PO PACK: 17.0000 g | PACK | Freq: Two times a day (BID) | ORAL | 0 refills | Status: DC

## 2017-12-24 MED ORDER — CARVEDILOL 12.5 MG PO TABS: 12.5000 mg | ORAL_TABLET | Freq: Two times a day (BID) | ORAL | 3 refills | Status: DC

## 2017-12-24 MED ORDER — PIOGLITAZONE HCL 30 MG PO TABS: 30.0000 mg | ORAL_TABLET | Freq: Every day | ORAL | 1 refills | Status: DC

## 2017-12-24 NOTE — Progress Notes (Signed)
Pt sodium  At (1752) is 121, (2329) is at 123,  (0314) is at 126. Notified [facility]. Will continue to monitor.

## 2017-12-24 NOTE — Progress Notes (Signed)
Dieticians are unavailable today. This RN will provide written and verbal education prior to discharge.

## 2017-12-24 NOTE — Progress Notes (Signed)
Patient ambulated 120 feet in hallway with NT. O2 sats 94% on room air while ambulating. Tolerated well. No complaints.

## 2017-12-24 NOTE — Care Management (Signed)
Discharge to home today per Dr. Elisabeth PigeonVachhani. Will be followed by [facility] for services in the home. Feliberto GottronJason Hinton, [facility] representative updated. Gwenette GreetBrenda S Agapita Savarino RN MSN CCM Care Management [phone]

## 2017-12-24 NOTE — Evaluation (Signed)
Physical Therapy Evaluation Patient Details Name: Patty Roberts MRN: [id number] DOB: 1931/11/24 Today's Date: 12/24/2017   History of Present Illness  Patient is an 84 year old female admitted from home for acute new onset of CHF and hyponatremia following c/o wheezing.  PMh includes stage IV CKD, Htn, GERD, DM and HLD.  Clinical Impression  Pt is an 84 year old female who lives in a one story home with her two daughters.  She is independent with occasional furniture cruising or hand held assist for mobility.  PT discussed use of cane for fall prevention and pt was agreeable.  She was in bed upon PT arrival and reported no pain.  Pt was able to perform bed mobility with min A to reposition before sitting up.  Pt able to sit at EOB without assistance.  She presented with generalized weakness in bilateral UE and LE but was able to transfer to toilet CGA.  Pt able to ambulate 20 feet in room with occasional hand held assist or furniture cruising and presented with some gait deviations indicative of fall risk.  Pt was able to manage sitting in chair without physical assist.  She presented with an O2 of 87% following mobilty and recovered to >90% following 1 min of rest.  RN was notified and PT educated pt and family concerning appropriate O2 levels related to exercise.  Pt will continue to benefit from skilled PT with focus on strength, balance, tolerance to activity and safe use of AD.    Follow Up Recommendations Home health PT    Equipment Recommendations  Cane(Quad cane)    Recommendations for Other Services       Precautions / Restrictions Precautions Precautions: Fall Restrictions Weight Bearing Restrictions: No      Mobility  Bed Mobility Overal bed mobility: Needs Assistance Bed Mobility: Supine to Sit     Supine to sit: Min assist;HOB elevated     General bed mobility comments: Needed min A to position to initiate sup to sit because pt was lying on her side too close to EOB  initiatlly.  Was able to complete remainder of movement on her own.  Transfers Overall transfer level: Needs assistance Equipment used: 1 person hand held assist Transfers: Sit to/from Stand Sit to Stand: Min assist         General transfer comment: Pt able to stand with min A to steady herself.  Pt moves slowly and cautiously with no cause for concern of falling.  Ambulation/Gait Ambulation/Gait assistance: Min assist Ambulation Distance (Feet): 15 Feet Assistive device: 1 person hand held assist     Gait velocity interpretation: <1.8 ft/sec, indicate of risk for recurrent falls General Gait Details: Low to moderate foot clearance, normal step length and occasional furniture cruising or use of PT's hand to steady herself.  PT discussed benefit of use of a cane for fall prevention in the event that she would need to walk somewhere without her daughter or furniture to hold to.  Stairs            Wheelchair Mobility    Modified Rankin (Stroke Patients Only)       Balance Overall balance assessment: Modified Independent                                           Pertinent Vitals/Pain Pain Assessment: No/denies pain    Home Living  Family/patient expects to be discharged to:: Private residence Living Arrangements: Children Available Help at Discharge: Family;Available PRN/intermittently Type of Home: House Home Access: Stairs to enter Entrance Stairs-Rails: Right Entrance Stairs-Number of Steps: 4 Home Layout: One level        Prior Function Level of Independence: Independent         Comments: Daughter holds to arm for longer distance walking.     Hand Dominance        Extremity/Trunk Assessment   Upper Extremity Assessment Upper Extremity Assessment: Generalized weakness    Lower Extremity Assessment Lower Extremity Assessment: Overall WFL for tasks assessed    Cervical / Trunk Assessment Cervical / Trunk Assessment:  Kyphotic(Slightly kyphotic)  Communication   Communication: No difficulties  Cognition Arousal/Alertness: Awake/alert Behavior During Therapy: WFL for tasks assessed/performed Overall Cognitive Status: Within Functional Limits for tasks assessed                                 General Comments: Very pleasant and ready to work with therapy.      General Comments      Exercises     Assessment/Plan    PT Assessment Patient needs continued PT services  PT Problem List Decreased strength;Decreased mobility;Decreased activity tolerance;Decreased balance;Decreased knowledge of use of DME;Cardiopulmonary status limiting activity       PT Treatment Interventions DME instruction;Therapeutic activities;Gait training;Therapeutic exercise;Patient/family education;Stair training;Balance training;Functional mobility training;Neuromuscular re-education    PT Goals (Current goals can be found in the Care Plan section)  Acute Rehab PT Goals Patient Stated Goal: To return home and continue to be active with her family. PT Goal Formulation: With patient Time For Goal Achievement: 01/07/18 Potential to Achieve Goals: Good    Frequency Min 2X/week   Barriers to discharge        Co-evaluation               AM-PAC PT "6 Clicks" Daily Activity  Outcome Measure Difficulty turning over in bed (including adjusting bedclothes, sheets and blankets)?: A Little Difficulty moving from lying on back to sitting on the side of the bed? : A Little Difficulty sitting down on and standing up from a chair with arms (e.g., wheelchair, bedside commode, etc,.)?: A Little Help needed moving to and from a bed to chair (including a wheelchair)?: A Little Help needed walking in hospital room?: A Little Help needed climbing 3-5 steps with a railing? : A Little 6 Click Score: 18    End of Session Equipment Utilized During Treatment: Gait belt Activity Tolerance: Patient tolerated treatment  well;Patient limited by fatigue Patient left: in chair;with call bell/phone within reach;with chair alarm set Nurse Communication: Mobility status PT Visit Diagnosis: Unsteadiness on feet (R26.81);Muscle weakness (generalized) (M62.81)    Time: 0800-0820 PT Time Calculation (min) (ACUTE ONLY): 20 min   Charges:   PT Evaluation $PT Eval Low Complexity: 1 Low PT Treatments $Therapeutic Activity: 8-22 mins   PT G Codes:   PT G-Codes **NOT FOR INPATIENT CLASS** Functional Assessment Tool Used: AM-PAC 6 Clicks Basic Mobility    Glenetta HewSarah Cynthis Purington, PT, DPT   Glenetta HewSarah Rajan Burgard 12/24/2017, 8:44 AM

## 2017-12-24 NOTE — Progress Notes (Signed)
Cardiovascular and Pulmonary Nurse Navigator Note   84 y.o. black female with history of diabetes, hypertension, GERD, hypercholesterolemia and chronic kidney disease stage III, baseline Cr 1.8, egfr 29 admitted from home for acute new onset of CHF and hyponatremia.  No prior echo.    1.  Acute renal failure on CKD stage IV baseline EGFR 29 10/2017. To f/u with nephrology in 1-2      weeks.   2.  Hyponatremia, hypervolemia 3.  Diastolic congestive heart failure - with acute exacerbation.  New onset.  EF 60-65% from echo      performed on 12/21/2017.   4.  Hypertension.  5.  Hyperkalemia - resolved 6. Diabetes mellitus type II with chronic kidney disease  CHF Education:   Patient, patient's daughter and daughter-in-law, and great grand-daughter present for educational session.    Provided patient with "Living Better with Heart Failure" packet. Briefly reviewed definition of heart failure and signs and symptoms of an exacerbation.?Explained to patient that HF is a chronic illness which requires self-assessment / self-management along with help from the cardiologist/PCP.??  *Reviewed importance of and reason behind checking weight daily in the AM, after using the bathroom, but before getting dressed.?Patient will purchase scales.?  Reviewed the following information with patient:  *Discussed when to call the Dr= weight gain of >2-3lb overnight of 5lb in a week,  *Discussed yellow zone= call MD: weight gain of >2-3lb overnight of 5lb in a week, increased swelling, increased SOB when lying down, chest discomfort, dizziness, increased fatigue *Red Zone= call 911: struggle to breath, fainting or near fainting, significant chest pain   *Reviewed low sodium diet-provided handout of recommended and not recommended foods.??Reviewed reading labels with patient. Discussed fluid intake with patient as well. Patient currently on 1200 ml fluid restriction.  Shared with patient and family what 1200 ml fluids  per 24 hours looked like using the bedside water pitcher.    *Instructed patient to take medications as prescribed for heart failure. Explained briefly why pt is on the medications (either make you feel better, live longer or keep you out of the hospital) and discussed monitoring and side effects.   *Smoking Cessation?- Patient is a NEVER Smoker.   *Discussed exercise - Physical Therapy is recommending HH PT.  Encouraged patient to remain as active as possible.    *[facility] - new patient appointment scheduled for 09/03/2017 at 10 a.m. ?Explained the purpose of the HF Clinic. ?Explained to patient / family the HF Clinic does not replace PCP nor Cardiologist, but is an additional resource to helping patient manage heart failure at home. New patient appointment scheduled for 01/02/2018 at 11:00 a.m. Brochure and directions provided.    Once again, the 5 Steps to Living Better with Heart Failure were reviewed.   Patient and family were engaged in discussion and asked pertinent questions.  Patient/family thanked me for providing the above information.  Roanna Epley, RN, BSN, [facility]? [facility]  Cardiovascular &?Pulmonary Nurse Navigator  Direct Line: [phone]  Department Phone #: [phone] Fax: [phone]? Email Address: [email or web address]? ?

## 2017-12-24 NOTE — Discharge Summary (Signed)
[facility] [place]    MR#:  [id number]  DATE OF BIRTH:  04-14-1932  DATE OF ADMISSION:  12/20/2017 ADMITTING PHYSICIAN: Demetrios Loll, MD  DATE OF DISCHARGE: 12/24/2017  4:44 PM  PRIMARY CARE PHYSICIAN: Kathrine Haddock, NP    ADMISSION DIAGNOSIS:  Breathing problems  DISCHARGE DIAGNOSIS:  Active Problems:   Acute CHF (congestive heart failure) ([place])   SECONDARY DIAGNOSIS:   Past Medical History:  Diagnosis Date  . Chronic kidney disease   . Diabetes mellitus without complication ([place])   . GERD (gastroesophageal reflux disease)   . Hyperlipidemia   . Hypertension     HOSPITAL COURSE:   *Severe hyponatremia, hypervolemic.  Initially patient was on Lasix which has been stopped.  Did not improved with tolvaptan.  Patient was on 3% saline today.  Monitor sodium levels closely.  Discussed with nephrology Dr. Juleen China.  Sodium improved to safe range, Nephrology advised to d/c home with lasix and follow in clinic in 1-2 weeks.  * New onset congestive heart failure.  No history of CHF and no old echocardiograms available. - Patient was on IV Lasix.  Stopped. - Input and Output - Counseled to limit fluids and Salt - Monitor Bun/Cr and Potassium - Echo-normal ejection fraction. -Cardiology follow up after discharge  * CKD stage IV.  Monitor while being diuresed.  * Hypertension.  Continue home medications.  * Hyperkalemia.  Resolved  * Hypoglycemia, uncontrolled diabetes mellitus.  Stopped glipizide.  Was on D10 for a day and now stopped.  * DVT prophylaxis with heparin    DISCHARGE CONDITIONS:   Stable.  CONSULTS OBTAINED:  Treatment Team:  Anthonette Legato, MD  DRUG ALLERGIES:   Allergies  Allergen Reactions  . Phenameth [Promethazine]   . Statins Other (See Comments)    Leg pain     DISCHARGE MEDICATIONS:   Allergies as of 12/24/2017      Reactions   Phenameth [promethazine]    Statins Other (See Comments)   Leg pain      Medication List    STOP taking these medications   aliskiren 300 MG tablet Commonly known as:  TEKTURNA   amLODipine 10 MG tablet Commonly known as:  NORVASC   azithromycin 250 MG tablet Commonly known as:  ZITHROMAX   blood glucose meter kit and supplies   hydrALAZINE 50 MG tablet Commonly known as:  APRESOLINE   sertraline 50 MG tablet Commonly known as:  ZOLOFT     TAKE these medications   albuterol 108 (90 Base) MCG/ACT inhaler Commonly known as:  PROVENTIL HFA;VENTOLIN HFA Inhale 2 puffs into the lungs every 6 (six) hours as needed for wheezing or shortness of breath.   benzonatate 200 MG capsule Commonly known as:  TESSALON Take 1 capsule (200 mg total) by mouth 3 (three) times daily as needed.   carvedilol 12.5 MG tablet Commonly known as:  COREG Take 1 tablet (12.5 mg total) by mouth 2 (two) times daily with a meal. Instead of Bystolic   cloNIDine 0.3 GY/69SW patch Commonly known as:  CATAPRES - Dosed in mg/24 hr APPLY 1 PATCH TO SKIN ONCE A WEEK   furosemide 40 MG tablet Commonly known as:  LASIX Take 1 tablet (40 mg total) by mouth daily.   glimepiride 2 MG tablet Commonly known as:  AMARYL take 1 tablet by mouth before BREAKFAST   meclizine 25 MG tablet Commonly known as:  ANTIVERT TAKE 1 TABLET  TWICE A DAY AS NEEDED FOR DIZZINESS What changed:  Another medication with the same name was removed. Continue taking this medication, and follow the directions you see here.   omeprazole 20 MG capsule Commonly known as:  PRILOSEC Take 1 capsule (20 mg total) by mouth every other day. What changed:  Another medication with the same name was removed. Continue taking this medication, and follow the directions you see here.   ondansetron 8 MG disintegrating tablet Commonly known as:  ZOFRAN-ODT dissolve 1 tablet ON TONGUE three times a day if needed for nausea and vomiting   pioglitazone 30 MG tablet Commonly  known as:  ACTOS Take 1 tablet (30 mg total) by mouth daily.   polyethylene glycol packet Commonly known as:  MIRALAX / GLYCOLAX Take 17 g by mouth 2 (two) times daily.   rosuvastatin 5 MG tablet Commonly known as:  CRESTOR Take 1 tablet (5 mg total) by mouth daily.        DISCHARGE INSTRUCTIONS:   Follow with Nephrology clinic to check Sodium level.  If you experience worsening of your admission symptoms, develop shortness of breath, life threatening emergency, suicidal or homicidal thoughts you must seek medical attention immediately by calling 911 or calling your MD immediately  if symptoms less severe.  You Must read complete instructions/literature along with all the possible adverse reactions/side effects for all the Medicines you take and that have been prescribed to you. Take any new Medicines after you have completely understood and accept all the possible adverse reactions/side effects.   Please note  You were cared for by a hospitalist during your hospital stay. If you have any questions about your discharge medications or the care you received while you were in the hospital after you are discharged, you can call the unit and asked to speak with the hospitalist on call if the hospitalist that took care of you is not available. Once you are discharged, your primary care physician will handle any further medical issues. Please note that NO REFILLS for any discharge medications will be authorized once you are discharged, as it is imperative that you return to your primary care physician (or establish a relationship with a primary care physician if you do not have one) for your aftercare needs so that they can reassess your need for medications and monitor your lab values.    Today   CHIEF COMPLAINT:   Chief Complaint  Patient presents with  . Wheezing    HISTORY OF PRESENT ILLNESS:  Patty Roberts  is a 84 y.o. female with a known history of hypertension, hyperlipidemia,  diabetes, CKD and GERD.  The patient presents the ED with above chief complaints.  She has had worsening shortness of breath for the past 1 week.  She denies any fever or chills but has cough with sputum.  She has some orthopnea and nocturnal dyspnea but obvious leg edema.  She is treated with DuoNeb and IV Solu-Medrol by EMS with some slight improvement.  Chest x-ray show moderate CHF.  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   VITAL SIGNS:  Blood pressure (!) 137/42, pulse 100, temperature 99 F (37.2 C), temperature source Oral, resp. rate 19, height 5' 2"  (1.575 m), weight 97.9 kg (215 lb 14.4 oz), SpO2 94 %.  I/O:    Intake/Output Summary (Last 24 hours) at 12/24/2017 1741 Last data filed at 12/24/2017 0800 Gross per 24 hour  Intake 250 ml  Output 200 ml  Net 50  ml    PHYSICAL EXAMINATION:    GENERAL:  84 y.o.-year-old patient lying in the bed. EYES: Pupils equal, round, reactive to light and accommodation. No scleral icterus. Extraocular muscles intact.  HEENT: Head atraumatic, normocephalic. Oropharynx and nasopharynx clear.  NECK:  Supple, no jugular venous distention. No thyroid enlargement, no tenderness.  LUNGS: Clear CARDIOVASCULAR: S1, S2 normal. No murmurs, rubs, or gallops.  ABDOMEN: Soft, nontender, nondistended. Bowel sounds present. No organomegaly or mass.  EXTREMITIES: No cyanosis, clubbing or edema b/l.    NEUROLOGIC: Cranial nerves II through XII are intact. No focal Motor or sensory deficits b/l.   PSYCHIATRIC: The patient is alert and oriented x 3.  SKIN: No obvious rash, lesion, or ulcer.     DATA REVIEW:   CBC Recent Labs  Lab 12/21/17 0430  WBC 8.7  HGB 10.9*  HCT 32.0*  PLT 349    Chemistries  Recent Labs  Lab 12/20/17 1539  12/23/17 2329  12/24/17 0721  NA 120*   < > 122*  123*   < > 129*  K 5.4*   < > 5.1  --   --   CL 88*   < > 90*  --   --   CO2 21*   < > 25  --   --   GLUCOSE 209*   < > 129*  --   --   BUN 38*   <  > 57*  --   --   CREATININE 1.92*   < > 2.29*  --   --   CALCIUM 8.3*   < > 7.6*  --   --   MG 1.7  --   --   --   --    < > = values in this interval not displayed.    Cardiac Enzymes Recent Labs  Lab 12/20/17 1539  TROPONINI 0.03*    Microbiology Results  No results found for this or any previous visit.  RADIOLOGY:  No results found.  EKG:   Orders placed or performed during the hospital encounter of 12/20/17  . ED EKG within 10 minutes  . ED EKG within 10 minutes      Management plans discussed with the patient, family and they are in agreement.  CODE STATUS:     Code Status Orders  (From admission, onward)        Start     Ordered   12/20/17 1507  Full code  Continuous     12/20/17 1507    Code Status History    This patient has a current code status but no historical code status.      TOTAL TIME TAKING CARE OF THIS PATIENT: 35 minutes.    Vaughan Basta M.D on 12/24/2017 at 5:41 PM  Between 7am to 6pm - Pager - [phone]  After 6pm go to [email or web address] - password [email or web address] [facility]  Office  [phone]  CC: Primary care physician; Kathrine Haddock, NP   Note: This dictation was prepared with Dragon dictation along with smaller phrase technology. Any transcriptional errors that result from this process are unintentional.

## 2017-12-24 NOTE — Progress Notes (Signed)
[facility]  ROUNDING NOTE   Subjective:   Daughter at bedside.   Sodium 129.  Hypertonic saline infusion at 22m/hr  Objective:  Vital signs in last 24 hours:  Temp:  [98.1 F (36.7 C)-99 F (37.2 C)] 99 F (37.2 C) (04/24 0459) Pulse Rate:  [60-100] 100 (04/24 0742) Resp:  [14-19] 19 (04/24 0742) BP: (112-137)/(42-65) 137/42 (04/24 0742) SpO2:  [89 %-93 %] 90 % (04/24 0836) Weight:  [97.9 kg (215 lb 14.4 oz)] 97.9 kg (215 lb 14.4 oz) (04/24 0459)  Weight change: -1.968 kg (-4 lb 5.4 oz) Filed Weights   12/22/17 0821 12/23/17 0329 12/24/17 0459  Weight: 99.9 kg (220 lb 3.8 oz) 97.4 kg (214 lb 12.8 oz) 97.9 kg (215 lb 14.4 oz)    Intake/Output: I/O last 3 completed shifts: In: 493 [P.O.:240; I.V.:253] Out: 501 [Urine:500; Stool:1]   Intake/Output this shift:  Total I/O In: -  Out: 200 [Urine:200]  Physical Exam: General: No acute distress  Head: Normocephalic, atraumatic. Moist oral mucosal membranes  Eyes: Anicteric  Neck: Supple, trachea midline  Lungs:  Clear bilaterally  Heart: S1S2 no rubs  Abdomen:  Soft, nontender, bowel sounds present  Extremities: No peripheral edema.  Neurologic: Awake, alert, following commands  Skin: No lesions       Basic Metabolic Panel: Recent Labs  Lab 12/20/17 1539 12/21/17 0430 12/22/17 0442  12/22/17 1119  12/23/17 0332  12/23/17 1313 12/23/17 1758 12/23/17 2329 12/24/17 0314 12/24/17 0721  NA 120* 119* 120*   < > 118*   < > 118*  117*   < > 119* 121* 122*  123* 126* 129*  K 5.4* 6.0* 4.5  --  4.3  --  4.5  --   --   --  5.1  --   --   CL 88* 86* 86*  --  84*  --  83*  --   --   --  90*  --   --   CO2 21* 24 26  --  26  --  26  --   --   --  25  --   --   GLUCOSE 209* 127* 92  --  123*  --  83  --   --   --  129*  --   --   BUN 38* 48* 50*  --  56*  --  57*  --   --   --  57*  --   --   CREATININE 1.92* 2.20* 2.08*  --  2.29*  --  2.41*  --   --   --  2.29*  --   --   CALCIUM 8.3* 8.2* 7.7*   --  7.5*  --  7.6*  --   --   --  7.6*  --   --   MG 1.7  --   --   --   --   --   --   --   --   --   --   --   --    < > = values in this interval not displayed.    Liver Function Tests: No results for input(s): AST, ALT, ALKPHOS, BILITOT, PROT, ALBUMIN in the last 168 hours. No results for input(s): LIPASE, AMYLASE in the last 168 hours. No results for input(s): AMMONIA in the last 168 hours.  CBC: Recent Labs  Lab 12/20/17 1123 12/21/17 0430  WBC 9.5 8.7  HGB 11.7* 10.9*  HCT 34.4* 32.0*  MCV 104.3* 104.8*  PLT 348 349    Cardiac Enzymes: Recent Labs  Lab 12/20/17 1539  TROPONINI 0.03*    BNP: Invalid input(s): POCBNP  CBG: Recent Labs  Lab 12/23/17 0744 12/23/17 1123 12/23/17 1647 12/23/17 2052 12/24/17 0742  GLUCAP 96 140* 142* 148* 97    Microbiology: No results found for this or any previous visit.  Coagulation Studies: No results for input(s): LABPROT, INR in the last 72 hours.  Urinalysis: No results for input(s): COLORURINE, LABSPEC, PHURINE, GLUCOSEU, HGBUR, BILIRUBINUR, KETONESUR, PROTEINUR, UROBILINOGEN, NITRITE, LEUKOCYTESUR in the last 72 hours.  Invalid input(s): APPERANCEUR    Imaging: No results found.   Medications:   . sodium chloride     . amLODipine  10 mg Oral Daily  . carvedilol  12.5 mg Oral BID WC  . cloNIDine  0.3 mg Transdermal Weekly  . heparin  5,000 Units Subcutaneous Q8H  . hydrALAZINE  50 mg Oral TID  . insulin aspart  0-5 Units Subcutaneous QHS  . insulin aspart  0-9 Units Subcutaneous TID WC  . polyethylene glycol  17 g Oral BID  . rosuvastatin  5 mg Oral Daily  . sodium chloride flush  3 mL Intravenous Q12H   sodium chloride, acetaminophen **OR** acetaminophen, albuterol, benzonatate, bisacodyl, clonazePAM, diphenhydrAMINE, HYDROcodone-acetaminophen, meclizine, ondansetron **OR** ondansetron (ZOFRAN) IV, senna-docusate, sodium chloride flush  Assessment/ Plan:  84 y.o. black female with history of  diabetes, hypertension, hypercholesterolemia and chronic kidney disease stage III, baseline Cr 1.8, egfr 29.   1.  Acute renal failure on CKD stage IV baseline EGFR 29 10/2017.  2.  Hyponatremia. 3.  Diastolic congestive heart failure - with acute exacerbation 4.  Hypertension.  5.  Hyperkalemia- treated with veltassa 6. Diabetes mellitus type II with chronic kidney disease.  Plan:  Sodium back to baseline. Discontinue hypertonic saline. May resume home diuretic regimen.  Follow up with nephrology in 1-2 weeks.    LOS: 4 Patty Roberts 4/24/201910:00 AM

## 2017-12-24 NOTE — Progress Notes (Signed)
Cheri FowlerErma W Sabas to be D/C'd Home per MD order. Patient given discharge teaching and paperwork regarding medications, diet, follow-up appointments and activity. Patient understanding verbalized. No questions or complaints at this time. Skin condition as charted. IV and telemetry removed prior to leaving.  No further needs by Care Management/Social Work.   An After Visit Summary was printed and given to the patient. Caregiver/family present during discharge teaching.     Clayborne DanaBeck, Vala Raffo B

## 2017-12-24 NOTE — Plan of Care (Signed)
  Problem: Clinical Measurements: Goal: Ability to maintain clinical measurements within normal limits will improve 12/24/2017 0239 by Myles GipKimrey, Jaselle Pryer M, RN Outcome: Progressing 12/24/2017 0239 by Myles GipKimrey, Deriyah Kunath M, RN Outcome: Progressing Goal: Will remain free from infection 12/24/2017 0239 by Myles GipKimrey, Chauncy Mangiaracina M, RN Outcome: Progressing 12/24/2017 0239 by Myles GipKimrey, Areonna Bran M, RN Outcome: Progressing   Problem: Activity: Goal: Risk for activity intolerance will decrease 12/24/2017 0239 by Myles GipKimrey, Emalia Witkop M, RN Outcome: Progressing 12/24/2017 0239 by Myles GipKimrey, Malesha Suliman M, RN Outcome: Progressing   Problem: Pain Managment: Goal: General experience of comfort will improve 12/24/2017 0239 by Myles GipKimrey, Shakeela Rabadan M, RN Outcome: Progressing 12/24/2017 0239 by Myles GipKimrey, Chrysa Rampy M, RN Outcome: Progressing 12/24/2017 0238 by Myles GipKimrey, Vivek Grealish M, RN Outcome: Progressing   Problem: Safety: Goal: Ability to remain free from injury will improve 12/24/2017 0239 by Myles GipKimrey, Lola Lofaro M, RN Outcome: Progressing 12/24/2017 0239 by Myles GipKimrey, Bernadette Gores M, RN Outcome: Progressing 12/24/2017 0238 by Myles GipKimrey, Afsheen Antony M, RN Outcome: Progressing   Problem: Skin Integrity: Goal: Risk for impaired skin integrity will decrease Outcome: Progressing

## 2017-12-26 ENCOUNTER — Telehealth: Payer: Self-pay

## 2017-12-26 NOTE — Telephone Encounter (Signed)
I'm thinking I am missing context on this?

## 2017-12-26 NOTE — Telephone Encounter (Signed)
She was discharged from the hospital.  Is she in rehab now?  Her sodium was low in the hospital and a low sodium diet may not be appropriate.

## 2017-12-26 NOTE — Telephone Encounter (Signed)
Copied from CRM [id number]. Topic: Inquiry >> Dec 26, 2017  2:26 PM Eston Mouldavis, Cheri B wrote: Reason for CRM: PTs daughter says her mother can get low sodium  diet meals from the hospital  if her Dr  writes a note  saying she  needs the low sodium diet , she is asking for it to be emailed to   [email or web address]  [phone]   Routing to provider.

## 2017-12-26 NOTE — Telephone Encounter (Signed)
Routing to provider. If it is ok to type a letter, what does it need to say?

## 2017-12-29 ENCOUNTER — Telehealth: Payer: Self-pay | Admitting: Unknown Physician Specialty

## 2017-12-29 NOTE — Telephone Encounter (Signed)
Called and spoke to [place]. Letters typed and ready for pick up. Hospital f/up will be scheduled when letters are picked up.

## 2017-12-29 NOTE — Telephone Encounter (Signed)
Copied from CRM [id number]. Topic: Quick Communication - See Telephone Encounter >> Dec 29, 2017  9:25 AM Eston Mould B wrote: CRM for notification. See Telephone encounter for: 12/29/17. Requesting Orders for   Home health 1x 1, 2x 2,  1 x1  for new onset CHF      Contact [phone]   Also wants Dr to know pt Hit right great toe  on bedroom jam and it is swollen bruised and painful

## 2017-12-29 NOTE — Telephone Encounter (Signed)
Routing to provider. Is it ok for notes?

## 2017-12-29 NOTE — Telephone Encounter (Signed)
OK for notes.  Please ask Laurelyn Sickle about the system issues.

## 2017-12-29 NOTE — Telephone Encounter (Signed)
Routing to provider  

## 2017-12-29 NOTE — Telephone Encounter (Signed)
The patient is wanting to get meals thru the hospital cafeteria and in order for the hospital to prepare her low sodium meals (that pt will pay for) they are requesting a note from the doctor. The patient's daughter, Patty Roberts, can come in to pick up the note and take it to the hospital. Please call [phone] when note is ready to pick up. They are hoping they can pick this up today.  Patty Roberts is taking this week off of work to stay with her mother. The school system (employer) is requesting a note for this week about caring for her mother for 12/29/17-01/02/18.  Patty Roberts (other daughter) will be caring for the patient next week (01/05/18-01/09/18) and would like a note for her job.   Pt is needing hospital f/u by 01/09/18, no openings. System would not allow scheduling on 12/31/17 at 1:30 (even though 30 min slot was available).  Please advise.

## 2017-12-29 NOTE — Telephone Encounter (Signed)
Copied from CRM [id number]. Topic: Quick Communication - See Telephone Encounter >> Dec 29, 2017  9:25 AM Eston Mould B wrote: CRM for notification. See Telephone encounter for: 12/29/17.

## 2017-12-29 NOTE — Telephone Encounter (Signed)
Called and spoke to [place]. Let her know what Elnita Maxwell said regarding the patient's toe and that Elnita Maxwell gave the OK for verbal orders.

## 2017-12-29 NOTE — Telephone Encounter (Signed)
Thanks.  I would wrap the toe in gauze and protect it.  We can refer to podiatry if needed.

## 2017-12-31 ENCOUNTER — Telehealth: Payer: Self-pay | Admitting: Unknown Physician Specialty

## 2017-12-31 NOTE — Telephone Encounter (Signed)
Verbal orders given  

## 2017-12-31 NOTE — Telephone Encounter (Signed)
Called and left Patty Roberts a VM asking for her to please return my call. OK for PEC to let her know that Elnita Maxwell gave the OK for verbal orders for patient if she calls back.

## 2017-12-31 NOTE — Telephone Encounter (Signed)
Copied from CRM [id number]. Topic: General - Other >> Dec 31, 2017  8:31 AM Maia Petties wrote: Reason for CRM: VO for PT 2x week 2 weeks, 1x week for 1 week Larita Fife states she called in Monday but I do not see any notes. She is requesting call today if possible.

## 2017-12-31 NOTE — Telephone Encounter (Signed)
Routing to provider for verbal orders 

## 2018-01-01 ENCOUNTER — Other Ambulatory Visit: Payer: Self-pay | Admitting: Unknown Physician Specialty

## 2018-01-01 NOTE — Progress Notes (Signed)
Patient ID: Patty Roberts, female    DOB: [birth year], 84 y.o.   MRN: [id number]  HPI  [name] is a 84 y/o female with a history of DM, hyperlipidemia, HTN, CKD, hyponatremia, GERD and chronic heart failure.   Echo report from 12/21/17 reviewed and showed an EF of 60-65% along with mild AS with normal PA pressure.  Admitted 12/20/17 due to new onset HF and hyponatremia. Was initially on lasix which had to be stopped. Needed 3% saline with consult with nephrology. Discharged after 4 days.   She presents today for her initial visit with a chief complaint of rhinorrhea. She describes this as chronic in nature having been present for several years. She has easy bruising along with this. She denies any fatigue, chest pain, cough, shortness of breath, dizziness, pedal edema, palpitations, abdominal distention, difficulty sleeping or weight gain.   Past Medical History:  Diagnosis Date  . CHF (congestive heart failure) (HCC)   . Chronic kidney disease   . Diabetes mellitus without complication (HCC)   . GERD (gastroesophageal reflux disease)   . Hyperlipidemia   . Hypertension   . Hyponatremia    Past Surgical History:  Procedure Laterality Date  . ABDOMINAL HYSTERECTOMY    . ANKLE FRACTURE SURGERY    . HERNIA REPAIR    . PACEMAKER INSERTION     x2  . vertebral fix     Family History  Problem Relation Age of Onset  . Hypertension Mother   . Thyroid disease Daughter   . Heart disease Son        heart attack   Social History   Tobacco Use  . Smoking status: Never Smoker  . Smokeless tobacco: Never Used  Substance Use Topics  . Alcohol use: No    Alcohol/week: 0.0 oz   Allergies  Allergen Reactions  . Phenameth [Promethazine]   . Statins Other (See Comments)    Leg pain    Prior to Admission medications   Medication Sig Start Date End Date Taking? Authorizing Provider  albuterol (PROVENTIL HFA;VENTOLIN HFA) 108 (90 Base) MCG/ACT inhaler Inhale 2 puffs into the lungs every 6  (six) hours as needed for wheezing or shortness of breath. 12/19/17  Yes Particia Nearing, PA-C  benzonatate (TESSALON) 200 MG capsule Take 1 capsule (200 mg total) by mouth 3 (three) times daily as needed. 12/19/17  Yes Particia Nearing, PA-C  carvedilol (COREG) 12.5 MG tablet Take 1 tablet (12.5 mg total) by mouth 2 (two) times daily with a meal. Instead of Bystolic 12/24/17  Yes Altamese Dilling, MD  cloNIDine (CATAPRES - DOSED IN MG/24 HR) 0.3 mg/24hr patch APPLY 1 PATCH TO SKIN ONCE A WEEK 10/20/17  Yes Gabriel Cirri, NP  furosemide (LASIX) 40 MG tablet Take 1 tablet (40 mg total) by mouth daily. 10/20/17  Yes Gabriel Cirri, NP  glimepiride (AMARYL) 2 MG tablet take 1 tablet by mouth before BREAKFAST 12/19/17  Yes Particia Nearing, PA-C  meclizine (ANTIVERT) 25 MG tablet TAKE 1 TABLET TWICE A DAY AS NEEDED FOR DIZZINESS 10/20/17  Yes Gabriel Cirri, NP  omeprazole (PRILOSEC) 20 MG capsule Take 1 capsule (20 mg total) by mouth every other day. 10/20/17  Yes Gabriel Cirri, NP  pioglitazone (ACTOS) 30 MG tablet Take 1 tablet (30 mg total) by mouth daily. 12/24/17  Yes Altamese Dilling, MD  rosuvastatin (CRESTOR) 5 MG tablet Take 1 tablet (5 mg total) by mouth daily. 10/20/17  Yes Gabriel Cirri, NP   Review  of Systems  Constitutional: Negative for appetite change and fatigue.  HENT: Positive for rhinorrhea. Negative for congestion and sore throat.   Eyes: Negative.   Respiratory: Negative for cough, chest tightness and shortness of breath.   Cardiovascular: Negative for chest pain, palpitations and leg swelling.  Gastrointestinal: Negative for abdominal distention and abdominal pain.  Endocrine: Negative.   Genitourinary: Negative.   Musculoskeletal: Negative for back pain and neck pain.  Skin: Negative.   Allergic/Immunologic: Negative.   Neurological: Negative for dizziness and light-headedness.  Hematological: Negative for adenopathy. Bruises/bleeds easily.   Psychiatric/Behavioral: Negative for dysphoric mood and sleep disturbance. The patient is not nervous/anxious.    Vitals:   01/02/18 1051  BP: 118/70  Pulse: 61  Resp: 18  SpO2: 99%  Weight: 200 lb 6 oz (90.9 kg)  Height:  (1.6 m)   Wt Readings from Last 3 Encounters:  01/02/18 200 lb 6 oz (90.9 kg)  12/24/17 215 lb 14.4 oz (97.9 kg)  11/03/17 219 lb (99.3 kg)   Lab Results  Component Value Date   CREATININE 2.29 (H) 12/23/2017   CREATININE 2.41 (H) 12/23/2017   CREATININE 2.29 (H) 12/22/2017    Physical Exam  Constitutional: She is oriented to person, place, and time. She appears well-developed and well-nourished.  HENT:  Head: Normocephalic and atraumatic.  Neck: Normal range of motion. Neck supple. No JVD present.  Cardiovascular: Normal rate and regular rhythm.  Pulmonary/Chest: Effort normal. No respiratory distress. She has no wheezes. She has no rales.  Abdominal: Soft. She exhibits no distension.  Musculoskeletal: She exhibits no edema or deformity.  Neurological: She is alert and oriented to person, place, and time.  Skin: Skin is warm and dry.  Psychiatric: She has a normal mood and affect. Her behavior is normal.  Nursing note and vitals reviewed.  Assessment & Plan:  1: Chronic heart failure with preserved ejection fraction- - NYHA class I - euvolemic today - weighing daily and says that she's lost weight since hospital discharge. Instructed to call for an overnight weight gain of >2 pounds or a weekly weight gain of >5 pounds - not adding salt but does eat out often. Reviewed the importance of closely following a  sodium diet and written dietary information was given to her about this - may need cardiology follow-up as she hasn't seen anyone outpatient - has home health nurse and PT coming to her home - BNP 12/23/17 was 300.0 - Pharm D reconciled medications with the patient  2: HTN- - BP looks good today - saw PCP Maurice March) 12/19/17 - BMP  12/24/17 reviewed and showed sodium 122, potassium 5.1 and GFR 22   3: DM- - fasting glucose this morning at home was 224; says that she ate pineapple late last night - nephrology follow-up on 01/09/18 - A1c 10/20/17 was 6.0%  4: Hyponatremia- - fluid restriction right now - may be able to decrease diuretic some at her next visit - says that nephrology will be checking lab work next week  Medication list was reviewed with patient.  Return here in 1 month or sooner for any questions/problems before then.

## 2018-01-01 NOTE — Telephone Encounter (Signed)
Copied from CRM [id number]. Topic: Quick Communication - Rx Refill/Question >> Jan 01, 2018  4:41 PM Zada Girt, Lumin L wrote: Medication: rosuvastatin (CRESTOR) 5 MG tablet (2 pills left/2days) Has the patient contacted their pharmacy? Yes.   (Agent: If no, request that the patient contact the pharmacy for the refill.) Preferred Pharmacy (with phone number or street name): [facility] - [place], [place] - [address] [place] [address] Phone: [phone] Fax: [phone] Agent: Please be advised that RX refills may take up to 3 business days. We ask that you follow-up with your pharmacy.

## 2018-01-02 ENCOUNTER — Other Ambulatory Visit: Payer: Self-pay | Admitting: *Deleted

## 2018-01-02 ENCOUNTER — Encounter: Payer: Self-pay | Admitting: Family

## 2018-01-02 ENCOUNTER — Ambulatory Visit: Payer: Medicare Other | Attending: Family | Admitting: Family

## 2018-01-02 VITALS — BP 118/70 | HR 61 | Resp 18 | Ht 63.0 in | Wt 200.4 lb

## 2018-01-02 DIAGNOSIS — N189 Chronic kidney disease, unspecified: Secondary | ICD-10-CM | POA: Diagnosis not present

## 2018-01-02 DIAGNOSIS — E871 Hypo-osmolality and hyponatremia: Secondary | ICD-10-CM | POA: Insufficient documentation

## 2018-01-02 DIAGNOSIS — N184 Chronic kidney disease, stage 4 (severe): Secondary | ICD-10-CM

## 2018-01-02 DIAGNOSIS — Z79899 Other long term (current) drug therapy: Secondary | ICD-10-CM | POA: Diagnosis not present

## 2018-01-02 DIAGNOSIS — I130 Hypertensive heart and chronic kidney disease with heart failure and stage 1 through stage 4 chronic kidney disease, or unspecified chronic kidney disease: Secondary | ICD-10-CM | POA: Diagnosis present

## 2018-01-02 DIAGNOSIS — E785 Hyperlipidemia, unspecified: Secondary | ICD-10-CM | POA: Diagnosis not present

## 2018-01-02 DIAGNOSIS — E1122 Type 2 diabetes mellitus with diabetic chronic kidney disease: Secondary | ICD-10-CM | POA: Insufficient documentation

## 2018-01-02 DIAGNOSIS — K219 Gastro-esophageal reflux disease without esophagitis: Secondary | ICD-10-CM | POA: Insufficient documentation

## 2018-01-02 DIAGNOSIS — Z888 Allergy status to other drugs, medicaments and biological substances status: Secondary | ICD-10-CM | POA: Insufficient documentation

## 2018-01-02 DIAGNOSIS — I10 Essential (primary) hypertension: Secondary | ICD-10-CM | POA: Insufficient documentation

## 2018-01-02 DIAGNOSIS — I5032 Chronic diastolic (congestive) heart failure: Secondary | ICD-10-CM | POA: Insufficient documentation

## 2018-01-02 DIAGNOSIS — Z7984 Long term (current) use of oral hypoglycemic drugs: Secondary | ICD-10-CM | POA: Diagnosis not present

## 2018-01-02 DIAGNOSIS — Z950 Presence of cardiac pacemaker: Secondary | ICD-10-CM | POA: Insufficient documentation

## 2018-01-02 DIAGNOSIS — I509 Heart failure, unspecified: Secondary | ICD-10-CM | POA: Insufficient documentation

## 2018-01-02 MED ORDER — ROSUVASTATIN CALCIUM 5 MG PO TABS: 5.0000 mg | ORAL_TABLET | Freq: Every day | ORAL | 0 refills | Status: DC

## 2018-01-02 NOTE — Patient Instructions (Signed)
Continue weighing daily and call for an overnight weight gain of > 2 pounds or a weekly weight gain of >5 pounds. 

## 2018-01-02 NOTE — Telephone Encounter (Signed)
Patient's daughter called back- she is waiting for her mail order and she just needs enough to get her through until her delivery arrives. She uses [facility].  2 week supply sent to pharmacy for patient.

## 2018-01-02 NOTE — Telephone Encounter (Signed)
Left message for pt to return call to office regarding refill. Most recent refill on 10/20/17 #90 with 1 refill sent to [facility]. Need to clarify if the pt is still using [facility] or if pt is requesting to have mediation filled at [facility].

## 2018-01-05 ENCOUNTER — Encounter: Payer: Self-pay | Admitting: Unknown Physician Specialty

## 2018-01-05 ENCOUNTER — Ambulatory Visit (INDEPENDENT_AMBULATORY_CARE_PROVIDER_SITE_OTHER): Payer: Medicare Other | Admitting: Unknown Physician Specialty

## 2018-01-05 VITALS — BP 184/71 | HR 69 | Temp 98.4°F | Ht 63.0 in | Wt 199.4 lb

## 2018-01-05 DIAGNOSIS — I499 Cardiac arrhythmia, unspecified: Secondary | ICD-10-CM

## 2018-01-05 DIAGNOSIS — Z09 Encounter for follow-up examination after completed treatment for conditions other than malignant neoplasm: Secondary | ICD-10-CM

## 2018-01-05 DIAGNOSIS — I10 Essential (primary) hypertension: Secondary | ICD-10-CM

## 2018-01-05 DIAGNOSIS — N184 Chronic kidney disease, stage 4 (severe): Secondary | ICD-10-CM

## 2018-01-05 DIAGNOSIS — I5032 Chronic diastolic (congestive) heart failure: Secondary | ICD-10-CM | POA: Diagnosis not present

## 2018-01-05 DIAGNOSIS — E1122 Type 2 diabetes mellitus with diabetic chronic kidney disease: Secondary | ICD-10-CM | POA: Diagnosis not present

## 2018-01-05 LAB — BAYER DCA HB A1C WAIVED: HB A1C: 6.8 % (ref <7.0)

## 2018-01-05 MED ORDER — CARVEDILOL 12.5 MG PO TABS: 12.5000 mg | ORAL_TABLET | Freq: Two times a day (BID) | ORAL | 1 refills | Status: DC

## 2018-01-05 NOTE — Patient Instructions (Signed)
Stop Actos 

## 2018-01-05 NOTE — Assessment & Plan Note (Signed)
High today but below SBP of 120 at home.  Also getting checked by home health

## 2018-01-05 NOTE — Assessment & Plan Note (Addendum)
Pt is doing well but would like to drink more fluids.  On fluid restriction at this time.  Will defer to nephrology who is seeing her later this week.  Continue f/u at the heart failure clinic

## 2018-01-05 NOTE — Progress Notes (Addendum)
BP (!) 184/71   Pulse 69   Temp 98.4 F (36.9 C) (Oral)   Ht  (1.6 m)   Wt 199 lb 6.4 oz (90.4 kg)   LMP  (LMP Unknown)   SpO2 96%   BMI 35.32 kg/m    Subjective:    Patient ID: Patty Roberts, female    DOB: May 17, 1932, 84 y.o.   MRN: [id number]  HPI: Patty Roberts is a 84 y.o. female  Chief Complaint  Patient presents with  . Hospitalization Follow-up    CHF   Hospital F/u.  Admitted 12/20/2017 for new onset CHF and hyponatremia.  She was discharged 4 days later.  However, I don't seem to have discharge notes at this time.    She did see Patty Kindred FNP for her heart failure and getting regular f/u.  EF 60-65%  DM. Last Hgb AiC was 6.0 on 10/20/17.  She is on Pioglitazone last written on hospital discharge.    Pt states she feels well at this time as she has lost weight and having no SOB.    Relevant past medical, surgical, family and social history reviewed and updated as indicated. Interim medical history since our last visit reviewed. Allergies and medications reviewed and updated.  Review of Systems  HENT: Negative.   Respiratory: Negative.   Cardiovascular: Negative.   Gastrointestinal: Negative.   Genitourinary: Negative.     Per HPI unless specifically indicated above     Objective:    BP (!) 184/71   Pulse 69   Temp 98.4 F (36.9 C) (Oral)   Ht  (1.6 m)   Wt 199 lb 6.4 oz (90.4 kg)   LMP  (LMP Unknown)   SpO2 96%   BMI 35.32 kg/m   Wt Readings from Last 3 Encounters:  01/05/18 199 lb 6.4 oz (90.4 kg)  01/02/18 200 lb 6 oz (90.9 kg)  12/24/17 215 lb 14.4 oz (97.9 kg)    Physical Exam  Constitutional: She is oriented to person, place, and time. She appears well-developed and well-nourished. No distress.  HENT:  Head: Normocephalic and atraumatic.  Eyes: Conjunctivae and lids are normal. Right eye exhibits no discharge. Left eye exhibits no discharge. No scleral icterus.  Neck: Normal range of motion. Neck supple. No JVD present. Carotid  bruit is not present.  Cardiovascular: Normal rate and normal heart sounds. An irregular rhythm present.  Pulmonary/Chest: Effort normal and breath sounds normal.  Abdominal: Normal appearance. There is no splenomegaly or hepatomegaly.  Musculoskeletal: Normal range of motion.  Neurological: She is alert and oriented to person, place, and time.  Skin: Skin is warm, dry and intact. No rash noted. No pallor.  Psychiatric: She has a normal mood and affect. Her behavior is normal. Judgment and thought content normal.   EKG NSR.  Ventricular pacing.      Assessment & Plan:   Problem List Items Addressed This Visit      Unprioritized   Chronic diastolic heart failure (HCC) (Chronic)    Pt is doing well but would like to drink more fluids.  On fluid restriction at this time.  Will defer to nephrology who is seeing her later this week.  Continue f/u at the heart failure clinic      Relevant Medications   carvedilol (COREG) 12.5 MG tablet   Controlled type 2 diabetes mellitus with chronic kidney disease (HCC)    Stable last visit.  Will DC Actos due to fluid retention associated  with that medication.  Hgb A1C is 6.8%      Relevant Orders   Bayer DCA Hb A1c Waived   HTN (hypertension) (Chronic)    High today but below SBP of 120 at home.  Also getting checked by home health      Relevant Medications   carvedilol (COREG) 12.5 MG tablet    Other Visit Diagnoses    Irregular heart rate    -  Primary   EKG shows ventricular pacing.     Relevant Orders   EKG 12-Lead (Completed)   Hospital discharge follow-up       Pt is stable without high risk of readmission       Follow up plan: Return in about 3 months (around 04/07/2018).

## 2018-01-05 NOTE — Assessment & Plan Note (Addendum)
Stable last visit.  Will DC Actos due to fluid retention associated with that medication.  Hgb A1C is 6.8%

## 2018-01-16 ENCOUNTER — Ambulatory Visit: Payer: Medicare Other | Admitting: Unknown Physician Specialty

## 2018-01-21 NOTE — Telephone Encounter (Signed)
closing

## 2018-01-22 ENCOUNTER — Telehealth: Payer: Self-pay

## 2018-01-22 NOTE — Telephone Encounter (Signed)
Called to schedule medicare annual wellness visit with NHA- Patty Kazee,LPN at [facility].  Any questions please contact Bedelia Person on skype or by phone- [phone]

## 2018-02-03 ENCOUNTER — Ambulatory Visit: Payer: Medicare Other | Admitting: Family

## 2018-02-10 ENCOUNTER — Ambulatory Visit: Payer: Medicare Other | Attending: Family | Admitting: Family

## 2018-02-10 ENCOUNTER — Encounter: Payer: Self-pay | Admitting: Family

## 2018-02-10 VITALS — BP 138/80 | HR 57 | Resp 18 | Ht 64.0 in | Wt 200.1 lb

## 2018-02-10 DIAGNOSIS — Z90710 Acquired absence of both cervix and uterus: Secondary | ICD-10-CM | POA: Diagnosis not present

## 2018-02-10 DIAGNOSIS — N189 Chronic kidney disease, unspecified: Secondary | ICD-10-CM | POA: Diagnosis not present

## 2018-02-10 DIAGNOSIS — E785 Hyperlipidemia, unspecified: Secondary | ICD-10-CM | POA: Diagnosis not present

## 2018-02-10 DIAGNOSIS — Z79899 Other long term (current) drug therapy: Secondary | ICD-10-CM | POA: Insufficient documentation

## 2018-02-10 DIAGNOSIS — Z7984 Long term (current) use of oral hypoglycemic drugs: Secondary | ICD-10-CM | POA: Diagnosis not present

## 2018-02-10 DIAGNOSIS — I130 Hypertensive heart and chronic kidney disease with heart failure and stage 1 through stage 4 chronic kidney disease, or unspecified chronic kidney disease: Secondary | ICD-10-CM | POA: Insufficient documentation

## 2018-02-10 DIAGNOSIS — I5032 Chronic diastolic (congestive) heart failure: Secondary | ICD-10-CM | POA: Insufficient documentation

## 2018-02-10 DIAGNOSIS — Z950 Presence of cardiac pacemaker: Secondary | ICD-10-CM | POA: Diagnosis not present

## 2018-02-10 DIAGNOSIS — N184 Chronic kidney disease, stage 4 (severe): Secondary | ICD-10-CM

## 2018-02-10 DIAGNOSIS — Z888 Allergy status to other drugs, medicaments and biological substances status: Secondary | ICD-10-CM | POA: Diagnosis not present

## 2018-02-10 DIAGNOSIS — K219 Gastro-esophageal reflux disease without esophagitis: Secondary | ICD-10-CM | POA: Diagnosis not present

## 2018-02-10 DIAGNOSIS — I10 Essential (primary) hypertension: Secondary | ICD-10-CM

## 2018-02-10 DIAGNOSIS — E1122 Type 2 diabetes mellitus with diabetic chronic kidney disease: Secondary | ICD-10-CM | POA: Diagnosis not present

## 2018-02-10 DIAGNOSIS — Z8349 Family history of other endocrine, nutritional and metabolic diseases: Secondary | ICD-10-CM | POA: Insufficient documentation

## 2018-02-10 DIAGNOSIS — I509 Heart failure, unspecified: Secondary | ICD-10-CM | POA: Diagnosis present

## 2018-02-10 DIAGNOSIS — Z8249 Family history of ischemic heart disease and other diseases of the circulatory system: Secondary | ICD-10-CM | POA: Diagnosis not present

## 2018-02-10 NOTE — Patient Instructions (Signed)
Continue weighing daily and call for an overnight weight gain of > 2 pounds or a weekly weight gain of >5 pounds. 

## 2018-02-10 NOTE — Progress Notes (Signed)
Patty Roberts ID: Patty Roberts, female    DOB: 27-Dec-1931, 84 y.o.   MRN: [id number]  HPI  Patty Roberts is a 84 y/o female with a history of DM, hyperlipidemia, HTN, CKD, hyponatremia, GERD and chronic heart failure.   Echo report from 12/21/17 reviewed and showed an EF of 60-65% along with mild AS with normal PA pressure.  Admitted 12/20/17 due to new onset HF and hyponatremia. Was initially on lasix which had to be stopped. Needed 3% saline with consult with nephrology. Discharged after 4 days.   Patty Roberts presents today for a follow-up visit with a chief complaint of rhinorrhea. Patty Roberts says that Patty Roberts has this off and on frequently. Endorses easy bruising along with this. Patty Roberts denies any difficulty sleeping, abdominal distention, palpitations, pedal edema, chest pain, shortness of breath, cough, dizziness or weight gain.   Past Medical History:  Diagnosis Date  . CHF (congestive heart failure) (HCC)   . Chronic kidney disease   . Diabetes mellitus without complication (HCC)   . GERD (gastroesophageal reflux disease)   . Hyperlipidemia   . Hypertension   . Hyponatremia    Past Surgical History:  Procedure Laterality Date  . ABDOMINAL HYSTERECTOMY    . ANKLE FRACTURE SURGERY    . HERNIA REPAIR    . PACEMAKER INSERTION     x2  . vertebral fix     Family History  Problem Relation Age of Onset  . Hypertension Mother   . Thyroid disease Daughter   . Heart disease Son        heart attack   Social History   Tobacco Use  . Smoking status: Never Smoker  . Smokeless tobacco: Never Used  Substance Use Topics  . Alcohol use: No    Alcohol/week: 0.0 oz   Allergies  Allergen Reactions  . Phenameth [Promethazine]   . Statins Other (See Comments)    Leg pain    Prior to Admission medications   Medication Sig Start Date End Date Taking? Authorizing Provider  albuterol (PROVENTIL HFA;VENTOLIN HFA) 108 (90 Base) MCG/ACT inhaler Inhale 2 puffs into the lungs every 6 (six) hours as needed for wheezing or  shortness of breath. 12/19/17  Yes Particia Nearing, PA-C  carvedilol (COREG) 12.5 MG tablet Take 1 tablet (12.5 mg total) by mouth 2 (two) times daily with a meal. Instead of Bystolic 01/05/18  Yes Gabriel Cirri, NP  cloNIDine (CATAPRES - DOSED IN MG/24 HR) 0.3 mg/24hr patch APPLY 1 PATCH TO SKIN ONCE A WEEK 10/20/17  Yes Gabriel Cirri, NP  furosemide (LASIX) 40 MG tablet Take 1 tablet (40 mg total) by mouth daily. 10/20/17  Yes Gabriel Cirri, NP  glimepiride (AMARYL) 2 MG tablet take 1 tablet by mouth before BREAKFAST 12/19/17  Yes Particia Nearing, PA-C  meclizine (ANTIVERT) 25 MG tablet TAKE 1 TABLET TWICE A DAY AS NEEDED FOR DIZZINESS 10/20/17  Yes Gabriel Cirri, NP  omeprazole (PRILOSEC) 20 MG capsule Take 1 capsule (20 mg total) by mouth every other day. 10/20/17  Yes Gabriel Cirri, NP  rosuvastatin (CRESTOR) 5 MG tablet Take 1 tablet (5 mg total) by mouth daily. 10/20/17  Yes Gabriel Cirri, NP    Review of Systems  Constitutional: Negative for appetite change and fatigue.  HENT: Positive for rhinorrhea. Negative for congestion and sore throat.   Eyes: Negative.   Respiratory: Negative for cough, chest tightness and shortness of breath.   Cardiovascular: Negative for chest pain, palpitations and leg swelling.  Gastrointestinal: Negative for  abdominal distention and abdominal pain.  Endocrine: Negative.   Genitourinary: Negative.   Musculoskeletal: Negative for back pain and neck pain.  Skin: Negative.   Allergic/Immunologic: Negative.   Neurological: Negative for dizziness and light-headedness.  Hematological: Negative for adenopathy. Bruises/bleeds easily.  Psychiatric/Behavioral: Negative for dysphoric mood and sleep disturbance. The Patty Roberts is not nervous/anxious.    Vitals:   02/10/18 1100  BP: 138/80  Pulse: (!) 57  Resp: 18  SpO2: 99%  Weight: 200 lb 2 oz (90.8 kg)  Height: 5\' 4"  (1.626 m)   Wt Readings from Last 3 Encounters:  02/10/18 200 lb 2 oz (90.8 kg)   01/05/18 199 lb 6.4 oz (90.4 kg)  01/02/18 200 lb 6 oz (90.9 kg)   Lab Results  Component Value Date   CREATININE 2.29 (H) 12/23/2017   CREATININE 2.41 (H) 12/23/2017   CREATININE 2.29 (H) 12/22/2017   Physical Exam  Constitutional: Patty Roberts is oriented to person, place, and time. Patty Roberts appears well-developed and well-nourished.  HENT:  Head: Normocephalic and atraumatic.  Neck: Normal range of motion. Neck supple. No JVD present.  Cardiovascular: Normal rate and regular rhythm.  Pulmonary/Chest: Effort normal. No respiratory distress. Patty Roberts has no wheezes. Patty Roberts has no rales.  Abdominal: Soft. Patty Roberts exhibits no distension.  Musculoskeletal: Patty Roberts exhibits no edema or deformity.  Neurological: Patty Roberts is alert and oriented to person, place, and time.  Skin: Skin is warm and dry.  Psychiatric: Patty Roberts has a normal mood and affect. Her behavior is normal.  Nursing note and vitals reviewed.  Assessment & Plan:  1: Chronic heart failure with preserved ejection fraction- - NYHA class I - euvolemic today - weighing daily and says that her weight has been stable. Reminded to call for an overnight weight gain of >2 pounds or a weekly weight gain of >5 pounds - weight stable since Patty Roberts was last here ~ 1 month ago - not adding salt but does eat out often. Reviewed the importance of closely following a 2000mg  sodium diet. Low sodium cookbook was given to her today - has appointment with cardiology Mariah Milling([place]) 03/13/18 - BNP 12/23/17 was 300.0  2: HTN- - BP looks good today - saw PCP Jamesetta Orleans([place]) 01/05/18 & returns August 2019 - BMP 12/23/17 reviewed and showed sodium 122, potassium 5.1 and GFR 22   3: DM- - fasting glucose this morning at home was 107 - saw nephrology Thedore Mins([place]) recently - A1c 01/05/18 was 6.8%  Medication list was reviewed with Patty Roberts.  Return in 6 months or sooner for any questions/problems before then.

## 2018-02-13 ENCOUNTER — Telehealth: Payer: Self-pay | Admitting: Unknown Physician Specialty

## 2018-02-13 NOTE — Telephone Encounter (Signed)
Needs to be seen

## 2018-02-13 NOTE — Telephone Encounter (Signed)
Copied from CRM [id number]. Topic: Inquiry >> Feb 13, 2018 11:52 AM Maia Pettiesrtiz, Kristie S wrote: Reason for CRM: pt states she is coughing and has a runny nose for about 3 days. She is requesting Elnita MaxwellCheryl to send in something to the pharmacy to help. She has not tried OTC meds. Please advise.  [facility] [address] [place], [place] - [phone] (Phone) [phone] (Fax)

## 2018-02-16 NOTE — Telephone Encounter (Signed)
Pt stated that it was just allergies and she is now taking zyrtec.

## 2018-02-18 ENCOUNTER — Telehealth: Payer: Self-pay | Admitting: *Deleted

## 2018-02-18 ENCOUNTER — Encounter: Payer: Self-pay | Admitting: *Deleted

## 2018-02-18 NOTE — Telephone Encounter (Signed)
Pt called to report weight gain of 2 lbs since yesterday. H/O chronic diastolic heart failure. On Lasix 40 mg QD, has not missed any doses.  No other symptoms; denies any SOB, CP, edema, fatigue. Pt states called her cardiologist and "Office closed until next week."  Instructed to alert PCP, "They may want to change my medication."  Also reports weight at OV last Tuesday was 202.0. Pt has been taking Zyrtec for allergies x 1 week. Is questioning if weight gain could be from this medication.  Please advise: [phone]

## 2018-02-18 NOTE — Telephone Encounter (Signed)
This encounter was created in error - please disregard.

## 2018-03-03 ENCOUNTER — Telehealth: Payer: Self-pay

## 2018-03-03 ENCOUNTER — Telehealth: Payer: Self-pay | Admitting: Unknown Physician Specialty

## 2018-03-03 ENCOUNTER — Ambulatory Visit: Payer: Self-pay | Admitting: Unknown Physician Specialty

## 2018-03-03 ENCOUNTER — Ambulatory Visit: Payer: Self-pay | Admitting: *Deleted

## 2018-03-03 MED ORDER — GLIMEPIRIDE 2 MG PO TABS: ORAL_TABLET | ORAL | 1 refills | Status: DC

## 2018-03-03 NOTE — Telephone Encounter (Signed)
OK.  Please let her know that 224 is not a dangerous BS number for her.  We can discontinue the amaryl completely

## 2018-03-03 NOTE — Telephone Encounter (Signed)
Patty Roberts, can you get an idea what she is finding too high?  Thanks

## 2018-03-03 NOTE — Telephone Encounter (Signed)
Patient had just spoken to [place]ittany Russell CMA Regarding medication changes just a few minutes ago and she forgot to tell her about that medication dropping her sugar in past and she required a snack. Three way conference with patient and [place]ittany Russell. [place]ittany will let the provider know about their conversation and get back to her

## 2018-03-03 NOTE — Telephone Encounter (Signed)
Pt called wanting to know what can be done for high blood sugar. She stated that she will have high blood sugars at night. She can not tell me of any readings now, does not remember. She just said that sometimes she does not feel good. Advised of things that she can do to keep her blood sugar from  being high. She had just finished breakfast so will call back in 2 hours to let me know what her blood sugar is. She had rice krispses and blueberries for breakfast, no added sugar.  [facility].  Reason for Disposition . General information question, no triage required and triager able to answer question  Answer Assessment - Initial Assessment Questions 1. REASON FOR CALL or QUESTION: "What is your reason for calling today?" or "How can I best help you?" or "What question do you have that I can help answer?"     Pt had a question for what can be done for high blood sugar..  Protocols used: INFORMATION ONLY CALL-A-AH

## 2018-03-03 NOTE — Telephone Encounter (Signed)
Pt letting dr. Ashley MarinerKnow her Blood Sugar was  225 at 1:30

## 2018-03-03 NOTE — Addendum Note (Signed)
Addended by: [name], Reanna Scoggin on: 03/03/2018 04:23 PM   Modules accepted: Orders

## 2018-03-03 NOTE — Telephone Encounter (Signed)
I don't find 225 too concerning.  But, we can increase Amaryl to BID.  Rx changed

## 2018-03-03 NOTE — Telephone Encounter (Signed)
Please see other encounter.

## 2018-03-03 NOTE — Telephone Encounter (Signed)
Not our patient

## 2018-03-03 NOTE — Telephone Encounter (Signed)
There is another message opened about this.

## 2018-03-03 NOTE — Telephone Encounter (Signed)
Called patient because she did not return call from this morning with her blood sugar. She stated that she ran out of needles and going to get more. Advised her to check her blood sugars and write them down, so the provider can see how they are running. Pt voiced understanding. She also stated that she is feeling fine right now, no dizziness, headache, blurred vision or weakness.

## 2018-03-03 NOTE — Telephone Encounter (Signed)
Called and let patient know what Elnita MaxwellCheryl said about BS and medication. Patient verbalized understanding.

## 2018-03-03 NOTE — Telephone Encounter (Signed)
Patient called back in regarding the glimepiride RX. Patient states that taking this medication once daily drops her BS to low because she states she has to snack at night. Patient states that she thinks taking 2 tablets daily will drop her BS to low. She states that [place] discussed with her possibly taking her off this medication in the past.

## 2018-03-03 NOTE — Telephone Encounter (Signed)
Patient notified of [name]'s instructions and verbalized understanding.

## 2018-03-09 ENCOUNTER — Other Ambulatory Visit: Payer: Self-pay | Admitting: Family Medicine

## 2018-03-09 ENCOUNTER — Other Ambulatory Visit: Payer: Self-pay | Admitting: Unknown Physician Specialty

## 2018-03-09 NOTE — Telephone Encounter (Signed)
Please review: clonidine TDS patch 4's 0.3 mg/24 hr patch, last refilled on 12/03/17 Amlodipine 10 mg discontinued on 12/20/17 Pioglitazone 30 mg discontinued on 10/20/17  Gabriel CirriCheryl [facility]  02/10/18 NOV 04/17/18

## 2018-03-10 ENCOUNTER — Other Ambulatory Visit: Payer: Self-pay

## 2018-03-10 MED ORDER — ONDANSETRON 8 MG PO TBDP: 8.0000 mg | ORAL_TABLET | Freq: Three times a day (TID) | ORAL | 0 refills | Status: DC | PRN

## 2018-03-10 NOTE — Telephone Encounter (Signed)
Meclizine refill Last OV:12/19/17 Last refill:10/30/17 40 tab/2 refill WUJ:[id number]:[facility] [place] - [address] [phone] (Phone) [phone] (Fax)

## 2018-03-12 NOTE — Progress Notes (Deleted)
Cardiology Office Note  Date:  03/12/2018   ID:  Patty Roberts, DOB [birth year], MRN [id number]  PCP:  Gabriel Cirri, NP   No chief complaint on file.   HPI:   Hyperlipidemia Chronic kidney disease Hyponatremia Shortness of breath Diabetes type 2, Hemoglobin A1c 6.8 Referred by Dr. Thedore Mins for consultation of her aortic stenosis and hypertension  Echocardiogram April 2019 ejection fraction 60-65% mild aortic valve stenosis normal right heart pressures  Admitted to the hospital April 2019 new-onset heart failure hyponatremia Initially started on Lasix which was stopped Then started on 3% saline Discharged after 4 days   PMH:   has a past medical history of CHF (congestive heart failure) (HCC), Chronic kidney disease, Diabetes mellitus without complication (HCC), GERD (gastroesophageal reflux disease), Hyperlipidemia, Hypertension, and Hyponatremia.  PSH:    Past Surgical History:  Procedure Laterality Date  . ABDOMINAL HYSTERECTOMY    . ANKLE FRACTURE SURGERY    . HERNIA REPAIR    . PACEMAKER INSERTION     x2  . vertebral fix      Current Outpatient Medications  Medication Sig Dispense Refill  . albuterol (PROVENTIL HFA;VENTOLIN HFA) 108 (90 Base) MCG/ACT inhaler Inhale 2 puffs into the lungs every 6 (six) hours as needed for wheezing or shortness of breath. 1 Inhaler 0  . carvedilol (COREG) 12.5 MG tablet Take 1 tablet (12.5 mg total) by mouth 2 (two) times daily with a meal. Instead of Bystolic 180 tablet 1  . cloNIDine (CATAPRES - DOSED IN MG/24 HR) 0.3 mg/24hr patch APPLY 1 PATCH TO SKIN ONCE A WEEK 12 patch 1  . cloNIDine (CATAPRES - DOSED IN MG/24 HR) 0.3 mg/24hr patch APPLY 1 PATCH TO SKIN ONCE A WEEK 12 patch 1  . furosemide (LASIX) 40 MG tablet Take 1 tablet (40 mg total) by mouth daily. 90 tablet 1  . meclizine (ANTIVERT) 25 MG tablet TAKE 1 TABLET TWICE A DAY AS NEEDED FOR DIZZINESS 40 tablet 2  . meclizine (ANTIVERT) 25 MG tablet TAKE 1 TABLET TWICE A DAY AS  NEEDED FOR DIZZINESS 40 tablet 2  . omeprazole (PRILOSEC) 20 MG capsule Take 1 capsule (20 mg total) by mouth every other day. 90 capsule 3  . ondansetron (ZOFRAN-ODT) 8 MG disintegrating tablet Take 1 tablet (8 mg total) by mouth every 8 (eight) hours as needed. 20 tablet 0  . rosuvastatin (CRESTOR) 5 MG tablet Take 1 tablet (5 mg total) by mouth daily. 90 tablet 1   No current facility-administered medications for this visit.      Allergies:   Phenameth [promethazine] and Statins   Social History:  The patient  reports that she has never smoked. She has never used smokeless tobacco. She reports that she does not drink alcohol or use drugs.   Family History:   family history includes Heart disease in her son; Hypertension in her mother; Thyroid disease in her daughter.    Review of Systems: ROS   PHYSICAL EXAM: VS:  LMP  (LMP Unknown)  , BMI There is no height or weight on file to calculate BMI. GEN: Well nourished, well developed, in no acute distress  HEENT: normal  Neck: no JVD, carotid bruits, or masses Cardiac: RRR; no murmurs, rubs, or gallops,no edema  Respiratory:  clear to auscultation bilaterally, normal work of breathing GI: soft, nontender, nondistended, + BS MS: no deformity or atrophy  Skin: warm and dry, no rash Neuro:  Strength and sensation are intact Psych: euthymic mood, full affect  Recent Labs: 10/20/2017: ALT 8 12/20/2017: Magnesium 1.7 12/21/2017: Hemoglobin 10.9; Platelets 349 12/23/2017: B Natriuretic Peptide 300.0; BUN 57; Creatinine, Ser 2.29; Potassium 5.1 12/24/2017: Sodium 129    Lipid Panel Lab Results  Component Value Date   CHOL 199 09/17/2016   HDL 58 09/17/2016   LDLCALC 110 (H) 09/17/2016   TRIG 154 (H) 09/17/2016      Wt Readings from Last 3 Encounters:  02/10/18 200 lb 2 oz (90.8 kg)  01/05/18 199 lb 6.4 oz (90.4 kg)  01/02/18 200 lb 6 oz (90.9 kg)       ASSESSMENT AND PLAN:  No diagnosis found.   Disposition:   F/U   6 months  No orders of the defined types were placed in this encounter.    Signed, Dossie Arbourim Gollan, M.D., Ph.D. 03/12/2018  [facility] [place] [phone]

## 2018-03-12 NOTE — Telephone Encounter (Signed)
Toniann FailWendy, daughter, calling back because the patient is not feeling well and she's not sure which medications she should be taking. Wants to know if Actos is current?

## 2018-03-13 ENCOUNTER — Ambulatory Visit: Payer: Medicare Other | Admitting: Cardiovascular Disease

## 2018-03-13 ENCOUNTER — Ambulatory Visit (INDEPENDENT_AMBULATORY_CARE_PROVIDER_SITE_OTHER): Payer: Medicare Other | Admitting: Physician Assistant

## 2018-03-13 ENCOUNTER — Emergency Department
Admission: EM | Admit: 2018-03-13 | Discharge: 2018-03-13 | Disposition: A | Discharge status: Home / Self Care | Payer: Medicare Other | Attending: Emergency Medicine | Admitting: Emergency Medicine

## 2018-03-13 ENCOUNTER — Other Ambulatory Visit: Payer: Self-pay

## 2018-03-13 ENCOUNTER — Encounter: Payer: Self-pay | Admitting: Emergency Medicine

## 2018-03-13 ENCOUNTER — Ambulatory Visit: Payer: Self-pay | Admitting: *Deleted

## 2018-03-13 VITALS — BP 172/110 | HR 71 | Temp 98.7°F

## 2018-03-13 DIAGNOSIS — I10 Essential (primary) hypertension: Secondary | ICD-10-CM

## 2018-03-13 DIAGNOSIS — N184 Chronic kidney disease, stage 4 (severe): Secondary | ICD-10-CM

## 2018-03-13 DIAGNOSIS — E860 Dehydration: Secondary | ICD-10-CM

## 2018-03-13 DIAGNOSIS — Z79899 Other long term (current) drug therapy: Secondary | ICD-10-CM | POA: Diagnosis not present

## 2018-03-13 DIAGNOSIS — E1122 Type 2 diabetes mellitus with diabetic chronic kidney disease: Secondary | ICD-10-CM | POA: Diagnosis not present

## 2018-03-13 DIAGNOSIS — I5032 Chronic diastolic (congestive) heart failure: Secondary | ICD-10-CM | POA: Insufficient documentation

## 2018-03-13 DIAGNOSIS — Z950 Presence of cardiac pacemaker: Secondary | ICD-10-CM | POA: Diagnosis not present

## 2018-03-13 DIAGNOSIS — I130 Hypertensive heart and chronic kidney disease with heart failure and stage 1 through stage 4 chronic kidney disease, or unspecified chronic kidney disease: Secondary | ICD-10-CM | POA: Diagnosis not present

## 2018-03-13 DIAGNOSIS — R5383 Other fatigue: Secondary | ICD-10-CM | POA: Diagnosis present

## 2018-03-13 LAB — CBC
HCT: 44.3 % (ref 35.0–47.0)
HEMOGLOBIN: 14.5 g/dL (ref 12.0–16.0)
MCH: 31.3 pg (ref 26.0–34.0)
MCHC: 32.8 g/dL (ref 32.0–36.0)
MCV: 95.5 fL (ref 80.0–100.0)
Platelets: 325 10*3/uL (ref 150–440)
RBC: 4.64 MIL/uL (ref 3.80–5.20)
RDW: 18.1 % — ABNORMAL HIGH (ref 11.5–14.5)
WBC: 8.8 10*3/uL (ref 3.6–11.0)

## 2018-03-13 LAB — BASIC METABOLIC PANEL
ANION GAP: 9 (ref 5–15)
BUN: 46 mg/dL — ABNORMAL HIGH (ref 8–23)
CO2: 25 mmol/L (ref 22–32)
Calcium: 8.9 mg/dL (ref 8.9–10.3)
Chloride: 99 mmol/L (ref 98–111)
Creatinine, Ser: 1.52 mg/dL — ABNORMAL HIGH (ref 0.44–1.00)
GFR calc Af Amer: 35 mL/min — ABNORMAL LOW (ref >60)
GFR, EST NON AFRICAN AMERICAN: 31 mL/min — AB (ref >60)
GLUCOSE: 363 mg/dL — AB (ref 70–99)
Potassium: 5.2 mmol/L — ABNORMAL HIGH (ref 3.5–5.1)
SODIUM: 133 mmol/L — AB (ref 135–145)

## 2018-03-13 LAB — GLUCOSE, CAPILLARY: Glucose-Capillary: 354 mg/dL — ABNORMAL HIGH (ref 70–99)

## 2018-03-13 MED ORDER — ONDANSETRON HCL 4 MG/2ML IJ SOLN
4.0000 mg | Freq: Once | INTRAMUSCULAR | Status: AC
  Administered 2018-03-13: 4 mg via INTRAVENOUS
  Filled 2018-03-13: qty 2

## 2018-03-13 MED ORDER — SODIUM CHLORIDE 0.9 % IV SOLN
1000.0000 mL | Freq: Once | INTRAVENOUS | Status: AC
  Administered 2018-03-13: 1000 mL via INTRAVENOUS

## 2018-03-13 MED ORDER — ONDANSETRON 4 MG PO TBDP: 4.0000 mg | ORAL_TABLET | Freq: Three times a day (TID) | ORAL | 0 refills | Status: AC | PRN

## 2018-03-13 NOTE — ED Triage Notes (Signed)
pcp called and is sending for fatigue with history of sodium alteration.

## 2018-03-13 NOTE — ED Provider Notes (Signed)
[facility] Emergency Department Provider Note   ____________________________________________    I have reviewed the triage vital signs and the nursing notes.   HISTORY  Chief Complaint Hyperglycemia and Fatigue     HPI Patty Roberts is a 84 y.o. female with a history of diabetes who was sent by her doctor for evaluation given reports of fatigue and elevated blood glucose.  Patient reports she feels tired but denies abdominal pain.  She has had some mild nausea but no vomiting, no diarrhea.  She is on a restricted fluid diet because of a history of hypo-natremia, she reports compliance with this.  No fevers or chills.  No sick contacts.   Past Medical History:  Diagnosis Date  . CHF (congestive heart failure) (HCC)   . Chronic kidney disease   . Diabetes mellitus without complication (HCC)   . GERD (gastroesophageal reflux disease)   . Hyperlipidemia   . Hypertension   . Hyponatremia     Patient Active Problem List   Diagnosis Date Noted  . Chronic diastolic heart failure (HCC) 01/02/2018  . HTN (hypertension) 01/02/2018  . Advanced care planning/counseling discussion 04/04/2017  . Anxiety 12/27/2016  . Ceruminosis, bilateral 12/27/2016  . Low back pain 04/12/2015  . Hyperlipidemia 04/11/2015  . Controlled type 2 diabetes mellitus with chronic kidney disease (HCC) 04/11/2015  . GERD (gastroesophageal reflux disease) 04/11/2015  . CKD (chronic kidney disease) stage 4, GFR 15-29 ml/min (HCC) 04/11/2015  . Heart murmur, systolic 04/11/2015    Past Surgical History:  Procedure Laterality Date  . ABDOMINAL HYSTERECTOMY    . ANKLE FRACTURE SURGERY    . HERNIA REPAIR    . PACEMAKER INSERTION     x2  . vertebral fix      Prior to Admission medications   Medication Sig Start Date End Date Taking? Authorizing Provider  glimepiride (AMARYL) 2 MG tablet Take 2 mg by mouth 2 (two) times daily before a meal. 03/03/18  Yes [provider]    albuterol (PROVENTIL HFA;VENTOLIN HFA) 108 (90 Base) MCG/ACT inhaler Inhale 2 puffs into the lungs every 6 (six) hours as needed for wheezing or shortness of breath. 12/19/17   Particia NearingLane, Rachel Elizabeth, PA-C  carvedilol (COREG) 12.5 MG tablet Take 1 tablet (12.5 mg total) by mouth 2 (two) times daily with a meal. Instead of Bystolic 01/05/18   [name], [name], NP  cloNIDine (CATAPRES - DOSED IN MG/24 HR) 0.3 mg/24hr patch APPLY 1 PATCH TO SKIN ONCE A WEEK 10/20/17   [name], [name], NP  furosemide (LASIX) 40 MG tablet Take 1 tablet (40 mg total) by mouth daily. 10/20/17   [name], [name], NP  meclizine (ANTIVERT) 25 MG tablet TAKE 1 TABLET TWICE A DAY AS NEEDED FOR DIZZINESS 10/20/17   [name], [name], NP  meclizine (ANTIVERT) 25 MG tablet TAKE 1 TABLET TWICE A DAY AS NEEDED FOR DIZZINESS 03/10/18   [name], [name], NP  omeprazole (PRILOSEC) 20 MG capsule Take 1 capsule (20 mg total) by mouth every other day. 10/20/17   [name], [name], NP  ondansetron (ZOFRAN ODT) 4 MG disintegrating tablet Take 1 tablet (4 mg total) by mouth every 8 (eight) hours as needed for nausea or vomiting. 03/13/18   Jene EveryKinner, Shadrick Senne, MD  rosuvastatin (CRESTOR) 5 MG tablet Take 1 tablet (5 mg total) by mouth daily. 10/20/17   [name], [name], NP     Allergies Phenameth [promethazine] and Statins  Family History  Problem Relation Age of Onset  . Hypertension Mother   .  Thyroid disease Daughter   . Heart disease Son        heart attack    Social History Social History   Tobacco Use  . Smoking status: Never Smoker  . Smokeless tobacco: Never Used  Substance Use Topics  . Alcohol use: No    Alcohol/week: 0.0 oz  . Drug use: No    Review of Systems  Constitutional: No fever/chills Eyes: No visual changes.  ENT: No sore throat. Cardiovascular: Denies chest pain. Respiratory: Denies shortness of breath. Gastrointestinal: No abdominal pain.     Genitourinary: Negative for dysuria. Musculoskeletal: Negative for back  pain. Skin: Negative for rash. Neurological: Negative for headaches    ____________________________________________   PHYSICAL EXAM:  VITAL SIGNS: ED Triage Vitals  Enc Vitals Group     BP 03/13/18 1158 92/62     Pulse Rate 03/13/18 1158 60     Resp 03/13/18 1158 16     Temp 03/13/18 1158 97.9 F (36.6 C)     Temp Source 03/13/18 1158 Oral     SpO2 03/13/18 1158 96 %     Weight 03/13/18 1159 91.2 kg (201 lb)     Height 03/13/18 1159 1.626 m (5\' 4" )     Head Circumference --      Peak Flow --      Pain Score 03/13/18 1159 0     Pain Loc --      Pain Edu? --      Excl. in GC? --     Constitutional: Alert and oriented. No acute distress. Pleasant and interactive Eyes: Conjunctivae are normal.   Nose: No congestion/rhinnorhea. Mouth/Throat: Mucous membranes are moist.    Cardiovascular: Normal rate, regular rhythm. Grossly normal heart sounds.  Good peripheral circulation. Respiratory: Normal respiratory effort.  No retractions. Lungs CTAB. Gastrointestinal: Soft and nontender. No distention.    Musculoskeletal: No lower extremity tenderness nor edema.  Warm and well perfused Neurologic:  Normal speech and language. No gross focal neurologic deficits are appreciated.  Skin:  Skin is warm, dry and intact. No rash noted. Psychiatric: Mood and affect are normal. Speech and behavior are normal.  ____________________________________________   LABS (all labs ordered are listed, but only abnormal results are displayed)  Labs Reviewed  BASIC METABOLIC PANEL - Abnormal; Notable for the following components:      Result Value   Sodium 133 (*)    Potassium 5.2 (*)    Glucose, Bld 363 (*)    BUN 46 (*)    Creatinine, Ser 1.52 (*)    GFR calc non Af Amer 31 (*)    GFR calc Af Amer 35 (*)    All other components within normal limits  CBC - Abnormal; Notable for the following components:   RDW 18.1 (*)    All other components within normal limits  GLUCOSE, CAPILLARY -  Abnormal; Notable for the following components:   Glucose-Capillary 354 (*)    All other components within normal limits  URINALYSIS, COMPLETE (UACMP) WITH MICROSCOPIC  CBG MONITORING, ED   ____________________________________________  EKG   ____________________________________________  RADIOLOGY  None ____________________________________________   PROCEDURES  Procedure(s) performed: No  Procedures   Critical Care performed: No ____________________________________________   INITIAL IMPRESSION / ASSESSMENT AND PLAN / ED COURSE  Pertinent labs & imaging results that were available during my care of the patient were reviewed by me and considered in my medical decision making (see chart for details).  Patient presents with complaints of mild fatigue  and some nausea.  With a history of hyperglycemia.  I suspect she is somewhat dehydrated given restricted fluid diet.  Lab work confirms this.  We will give IV fluids, IV Zofran and reevaluate  Patient reports her nausea is resolved, she is quite hungry, we will p.o. challenge her.  IV fluids infusing, anticipate discharge after completion with outpatient follow-up with PCP.  Return precautions discussed    ____________________________________________   FINAL CLINICAL IMPRESSION(S) / ED DIAGNOSES  Final diagnoses:  Dehydration        Note:  This document was prepared using Dragon voice recognition software and may include unintentional dictation errors.    Jene Every, MD 03/13/18 [phone]

## 2018-03-13 NOTE — Telephone Encounter (Signed)
Seen this AM

## 2018-03-13 NOTE — ED Notes (Signed)
Pt not to be discharged until bag of IV fluids is complete - fluids are on pump and should complete in 30 min - Dr Cyril LoosenKinner is aware

## 2018-03-13 NOTE — Telephone Encounter (Signed)
Patient is calling to discuss her elevated glucose levels- she had been taken off of her diabetic medications and she had a spike and had to call EMS. Patient states her readings were in the 400's.She did have some medication and she was told to take it- her levels did come down. Her reading at 4:30 was 271 and this morning it is 231. Patient has been scheduled for 11:40 today.  Reason for Disposition . Blood glucose > 400 mg/dL (47.822.2 mmol/L)  Answer Assessment - Initial Assessment Questions 1. BLOOD GLUCOSE: "What is your blood glucose level?"      231 2. ONSET: "When did you check the blood glucose?"     8:30 3. USUAL RANGE: "What is your glucose level usually?" (e.g., usual fasting morning value, usual evening value)     220-235 when not on medication 4. KETONES: "Do you check for ketones (urine or blood test strips)?" If yes, ask: "What does the test show now?"      no 5. TYPE 1 or 2:  "Do you know what type of diabetes you have?"  (e.g., Type 1, Type 2, Gestational; doesn't know)      Type 2 6. INSULIN: "Do you take insulin?" "What type of insulin(s) do you use? What is the mode of delivery? (syringe, pen (e.g., injection or  pump)?"      no 7. DIABETES PILLS: "Do you take any pills for your diabetes?" If yes, ask: "Have you missed taking any pills recently?"     Started yesterday- 5:30pm EMS were called for elevated glucose  8. OTHER SYMPTOMS: "Do you have any symptoms?" (e.g., fever, frequent urination, difficulty breathing, dizziness, weakness, vomiting)     Fatigue and nausea 9. PREGNANCY: "Is there any chance you are pregnant?" "When was your last menstrual period?"     n/a  Protocols used: DIABETES - HIGH BLOOD SUGAR-A-AH

## 2018-03-13 NOTE — Progress Notes (Signed)
Subjective:    Patient ID: Patty Roberts, female    DOB: 04/21/1932, 84 y.o.   MRN: [id number]  Patty Roberts is a 84 y.o. female presenting on 03/13/2018 for Fatigue and Nausea   HPI   PMH significant for controlled DM II, CKD IV, CHF, history of hospitalization for hyponatremia presenting today for overall fatigue x 1 week. She reports being discontinued from her diabetic medications glimeperide and actos because of good glycemic control and hypoglycemic events with medication. Recently, her sugars have been in the 400's. Called EMS yesterday because she didn't feel well and sugars were 400's, came down to 231 fasting. Denies increased urination, SOB. Denies fevers, cough, chest pain. Endorses fatigue and nausea with vomiting. Denies diarrhea. Denies weight gain, weight pounds     Wt Readings from Last 3 Encounters:  03/13/18 201 lb (91.2 kg)  02/10/18 200 lb 2 oz (90.8 kg)  01/05/18 199 lb 6.4 oz (90.4 kg)     Social History   Tobacco Use  . Smoking status: Never Smoker  . Smokeless tobacco: Never Used  Substance Use Topics  . Alcohol use: No    Alcohol/week: 0.0 oz  . Drug use: No    Review of Systems Per HPI unless specifically indicated above     Objective:    BP (!) 172/110 (BP Location: Left Arm, Patient Position: Sitting, Cuff Size: Normal)   Pulse 71   Temp 98.7 F (37.1 C) (Tympanic)   LMP  (LMP Unknown)   SpO2 97%   Wt Readings from Last 3 Encounters:  03/13/18 201 lb (91.2 kg)  02/10/18 200 lb 2 oz (90.8 kg)  01/05/18 199 lb 6.4 oz (90.4 kg)    Physical Exam  Constitutional: She is oriented to person, place, and time. She appears well-developed and well-nourished.  Cardiovascular: Normal rate and regular rhythm.  Pulmonary/Chest: Effort normal and breath sounds normal. She has no wheezes. She has no rales.  Abdominal: Soft. Bowel sounds are normal. She exhibits ascites.  Musculoskeletal: She exhibits no edema.  Neurological: She is alert and oriented to  person, place, and time.  Skin: Skin is warm and dry.  Psychiatric: She has a normal mood and affect. Her behavior is normal.   Results for orders placed or performed in visit on 01/05/18  Bayer DCA Hb A1c Waived  Result Value Ref Range   HB A1C (BAYER DCA - WAIVED) 6.8 <7.0 %      Assessment & Plan:  1. Chronic diastolic heart failure (HCC)  Her weights have been stable and she does not appear volume overloaded today, no rales, ascites or bilateral peripheral edema. She appears well in the office. Do not think she is volume overloaded. However, patient feels very unwell per her report and she does have a history of severe hyponatremia requiring 3% saline solution inpatient setting. Would like to reassess her for this however, it being Friday afternoon have counseled that to get labs back and treated in the immediate sense, she will have to go to the ER. Other option is to draw labs here and wait until Monday. She elects to go to the ER and I have called ahead.   2. Essential hypertension  Stable today.   3. Controlled type 2 diabetes mellitus with stage 4 chronic kidney disease, without long-term current use of insulin (HCC)  Will have her restart glimeperide at 1 mg twice daily. She should cut her medication in half. Follow up with provider in clinic.  4. CKD (chronic kidney disease) stage 4, GFR 15-29 ml/min (HCC)  Continues.    Follow up plan: Return in about 2 weeks (around 03/27/2018).  Osvaldo Angst, PA-C [facility] 03/16/2018, 3:39 PM

## 2018-03-13 NOTE — Telephone Encounter (Signed)
Didn't see this until this AM. Routing to PCP

## 2018-03-13 NOTE — ED Triage Notes (Signed)
Pt to ED via POV, pt was sent over from her PCP office to be evaluated for hyperglycemia and fatigue. Pt has hx/o hyponatremia and was hospitalized in April for this. Pt has been off of her diabetes medications for a few months but started back on her glimepiride 2 mg yesterday. Pt is in NAD at this time.

## 2018-03-16 NOTE — Patient Instructions (Signed)
Heart Failure °Heart failure means your heart has trouble pumping blood. This makes it hard for your body to work well. Heart failure is usually a long-term (chronic) condition. You must take good care of yourself and follow your doctor's treatment plan. °Follow these instructions at home: °· Take your heart medicine as told by your doctor. °? Do not stop taking medicine unless your doctor tells you to. °? Do not skip any dose of medicine. °? Refill your medicines before they run out. °? Take other medicines only as told by your doctor or pharmacist. °· Stay active if told by your doctor. The elderly and people with severe heart failure should talk with a doctor about physical activity. °· Eat heart-healthy foods. Choose foods that are without trans fat and are low in saturated fat, cholesterol, and salt (sodium). This includes fresh or frozen fruits and vegetables, fish, lean meats, fat-free or low-fat dairy foods, whole grains, and high-fiber foods. Lentils and dried peas and beans (legumes) are also good choices. °· Limit salt if told by your doctor. °· Cook in a healthy way. Roast, grill, broil, bake, poach, steam, or stir-fry foods. °· Limit fluids as told by your doctor. °· Weigh yourself every morning. Do this after you pee (urinate) and before you eat breakfast. Write down your weight to give to your doctor. °· Take your blood pressure and write it down if your doctor tells you to. °· Ask your doctor how to check your pulse. Check your pulse as told. °· Lose weight if told by your doctor. °· Stop smoking or chewing tobacco. Do not use gum or patches that help you quit without your doctor's approval. °· Schedule and go to doctor visits as told. °· Nonpregnant women should have no more than 1 drink a day. Men should have no more than 2 drinks a day. Talk to your doctor about drinking alcohol. °· Stop illegal drug use. °· Stay current with shots (immunizations). °· Manage your health conditions as told by your  doctor. °· Learn to manage your stress. °· Rest when you are tired. °· If it is really hot outside: °? Avoid intense activities. °? Use air conditioning or fans, or get in a cooler place. °? Avoid caffeine and alcohol. °? Wear loose-fitting, lightweight, and light-colored clothing. °· If it is really cold outside: °? Avoid intense activities. °? Layer your clothing. °? Wear mittens or gloves, a hat, and a scarf when going outside. °? Avoid alcohol. °· Learn about heart failure and get support as needed. °· Get help to maintain or improve your quality of life and your ability to care for yourself as needed. °Contact a doctor if: °· You gain weight quickly. °· You are more short of breath than usual. °· You cannot do your normal activities. °· You tire easily. °· You cough more than normal, especially with activity. °· You have any or more puffiness (swelling) in areas such as your hands, feet, ankles, or belly (abdomen). °· You cannot sleep because it is hard to breathe. °· You feel like your heart is beating fast (palpitations). °· You get dizzy or light-headed when you stand up. °Get help right away if: °· You have trouble breathing. °· There is a change in mental status, such as becoming less alert or not being able to focus. °· You have chest pain or discomfort. °· You faint. °This information is not intended to replace advice given to you by your health care provider. Make sure you   discuss any questions you have with your health care provider. °Document Released: 05/28/2008 Document Revised: 01/25/2016 Document Reviewed: 10/05/2012 °Elsevier Interactive Patient Education © 2017 [employer] ° °

## 2018-03-19 ENCOUNTER — Ambulatory Visit: Payer: Medicare Other | Admitting: Family Medicine

## 2018-03-20 ENCOUNTER — Encounter: Payer: Self-pay | Admitting: Unknown Physician Specialty

## 2018-03-23 ENCOUNTER — Other Ambulatory Visit: Payer: Self-pay

## 2018-03-23 ENCOUNTER — Emergency Department: Payer: Medicare Other

## 2018-03-23 ENCOUNTER — Encounter: Payer: Self-pay | Admitting: *Deleted

## 2018-03-23 ENCOUNTER — Emergency Department
Admission: EM | Admit: 2018-03-23 | Discharge: 2018-03-23 | Disposition: A | Discharge status: Home / Self Care | Payer: Medicare Other | Attending: Emergency Medicine | Admitting: Emergency Medicine

## 2018-03-23 DIAGNOSIS — N184 Chronic kidney disease, stage 4 (severe): Secondary | ICD-10-CM | POA: Diagnosis not present

## 2018-03-23 DIAGNOSIS — Z950 Presence of cardiac pacemaker: Secondary | ICD-10-CM | POA: Insufficient documentation

## 2018-03-23 DIAGNOSIS — Z79899 Other long term (current) drug therapy: Secondary | ICD-10-CM | POA: Diagnosis not present

## 2018-03-23 DIAGNOSIS — M6281 Muscle weakness (generalized): Secondary | ICD-10-CM | POA: Insufficient documentation

## 2018-03-23 DIAGNOSIS — R0602 Shortness of breath: Secondary | ICD-10-CM | POA: Diagnosis present

## 2018-03-23 DIAGNOSIS — I5032 Chronic diastolic (congestive) heart failure: Secondary | ICD-10-CM | POA: Diagnosis not present

## 2018-03-23 DIAGNOSIS — R531 Weakness: Secondary | ICD-10-CM

## 2018-03-23 DIAGNOSIS — E119 Type 2 diabetes mellitus without complications: Secondary | ICD-10-CM | POA: Insufficient documentation

## 2018-03-23 DIAGNOSIS — I509 Heart failure, unspecified: Secondary | ICD-10-CM

## 2018-03-23 DIAGNOSIS — I130 Hypertensive heart and chronic kidney disease with heart failure and stage 1 through stage 4 chronic kidney disease, or unspecified chronic kidney disease: Secondary | ICD-10-CM | POA: Diagnosis not present

## 2018-03-23 DIAGNOSIS — I10 Essential (primary) hypertension: Secondary | ICD-10-CM

## 2018-03-23 LAB — CBC
HCT: 40.6 % (ref 35.0–47.0)
HEMOGLOBIN: 13.5 g/dL (ref 12.0–16.0)
MCH: 31.5 pg (ref 26.0–34.0)
MCHC: 33.2 g/dL (ref 32.0–36.0)
MCV: 94.9 fL (ref 80.0–100.0)
PLATELETS: 294 10*3/uL (ref 150–440)
RBC: 4.27 MIL/uL (ref 3.80–5.20)
RDW: 19.2 % — ABNORMAL HIGH (ref 11.5–14.5)
WBC: 7.3 10*3/uL (ref 3.6–11.0)

## 2018-03-23 LAB — URINALYSIS, COMPLETE (UACMP) WITH MICROSCOPIC
BILIRUBIN URINE: NEGATIVE
Bacteria, UA: NONE SEEN
GLUCOSE, UA: 50 mg/dL — AB
KETONES UR: NEGATIVE mg/dL
LEUKOCYTES UA: NEGATIVE
NITRITE: NEGATIVE
PH: 6 (ref 5.0–8.0)
Specific Gravity, Urine: 1.009 (ref 1.005–1.030)

## 2018-03-23 LAB — BASIC METABOLIC PANEL
ANION GAP: 7 (ref 5–15)
BUN: 46 mg/dL — ABNORMAL HIGH (ref 8–23)
CALCIUM: 8.5 mg/dL — AB (ref 8.9–10.3)
CO2: 26 mmol/L (ref 22–32)
Chloride: 101 mmol/L (ref 98–111)
Creatinine, Ser: 2 mg/dL — ABNORMAL HIGH (ref 0.44–1.00)
GFR calc non Af Amer: 22 mL/min — ABNORMAL LOW (ref >60)
GFR, EST AFRICAN AMERICAN: 25 mL/min — AB (ref >60)
Glucose, Bld: 270 mg/dL — ABNORMAL HIGH (ref 70–99)
Potassium: 5 mmol/L (ref 3.5–5.1)
SODIUM: 134 mmol/L — AB (ref 135–145)

## 2018-03-23 LAB — TROPONIN I: Troponin I: 0.04 ng/mL (ref <0.03)

## 2018-03-23 NOTE — ED Triage Notes (Signed)
Pt to ED reporting increased weakness and fatigue over the past week. Dyspnea upon exertion reported as well with a hx of "fluid around my lungs." Pt also stating increased anxiety throughout the past week.   Pt was seen last week for similar complaint and was dx with dehydration and given NS that pt reports made her feel "much better." No swelling or edema noted in lower ext.   Pt has a hx of HTN and reports the BP is usually elevated when she comes to the doctor. Intermittent headaches over the past week. No neuro deficits.

## 2018-03-23 NOTE — ED Notes (Signed)
Pt assisted to toilet 

## 2018-03-23 NOTE — ED Notes (Signed)
Pt discharged home after verbalizing understanding of discharge instructions; nad noted. 

## 2018-03-23 NOTE — Discharge Instructions (Addendum)
Increase your lasix to 40mg  two times a day for today and tomorrow and follow up in the Heart Failure clinic for further evaluation of your symptoms.

## 2018-03-23 NOTE — ED Provider Notes (Signed)
[facility] Emergency Department Provider Note  ____________________________________________  Time seen: Approximately 4:48 PM  I have reviewed the triage vital signs and the nursing notes.   HISTORY  Chief Complaint Weakness and Shortness of Breath    HPI Patty Roberts is a 84 y.o. female with a history of CKD, CHF, diabetes, hypertension who complains of fatigue and generalized weakness for the past week, worse today.  She fatigues very easily when she gets up and walks and feels more short of breath.  She has chronic shortness of breath from congestive heart failure.  Takes Lasix daily and has been compliant.  Denies chest pain dizziness syncope or palpitations.  Denies fevers chills sweats or cough.  Symptoms are intermittent, no alleviating factors.  Sleeps on several pillows due to chronic neck pain, which is unchanged.  Does not wake up in the night having to catch her breath.    Past Medical History:  Diagnosis Date  . CHF (congestive heart failure) (HCC)   . Chronic kidney disease   . Diabetes mellitus without complication (HCC)   . GERD (gastroesophageal reflux disease)   . Hyperlipidemia   . Hypertension   . Hyponatremia      Patient Active Problem List   Diagnosis Date Noted  . Chronic diastolic heart failure (HCC) 01/02/2018  . HTN (hypertension) 01/02/2018  . Advanced care planning/counseling discussion 04/04/2017  . Anxiety 12/27/2016  . Ceruminosis, bilateral 12/27/2016  . Low back pain 04/12/2015  . Hyperlipidemia 04/11/2015  . Controlled type 2 diabetes mellitus with chronic kidney disease (HCC) 04/11/2015  . GERD (gastroesophageal reflux disease) 04/11/2015  . CKD (chronic kidney disease) stage 4, GFR 15-29 ml/min (HCC) 04/11/2015  . Heart murmur, systolic 04/11/2015     Past Surgical History:  Procedure Laterality Date  . ABDOMINAL HYSTERECTOMY    . ANKLE FRACTURE SURGERY    . HERNIA REPAIR    . PACEMAKER INSERTION     x2   . vertebral fix       Prior to Admission medications   Medication Sig Start Date End Date Taking? Authorizing Provider  albuterol (PROVENTIL HFA;VENTOLIN HFA) 108 (90 Base) MCG/ACT inhaler Inhale 2 puffs into the lungs every 6 (six) hours as needed for wheezing or shortness of breath. 12/19/17   Particia Nearing, PA-C  carvedilol (COREG) 12.5 MG tablet Take 1 tablet (12.5 mg total) by mouth 2 (two) times daily with a meal. Instead of Bystolic 01/05/18   Gabriel Cirri, NP  cloNIDine (CATAPRES - DOSED IN MG/24 HR) 0.3 mg/24hr patch APPLY 1 PATCH TO SKIN ONCE A WEEK 10/20/17   Gabriel Cirri, NP  furosemide (LASIX) 40 MG tablet Take 1 tablet (40 mg total) by mouth daily. 10/20/17   Gabriel Cirri, NP  glimepiride (AMARYL) 2 MG tablet Take 2 mg by mouth 2 (two) times daily before a meal. 03/03/18   [provider]  meclizine (ANTIVERT) 25 MG tablet TAKE 1 TABLET TWICE A DAY AS NEEDED FOR DIZZINESS 10/20/17   Gabriel Cirri, NP  meclizine (ANTIVERT) 25 MG tablet TAKE 1 TABLET TWICE A DAY AS NEEDED FOR DIZZINESS 03/10/18   Gabriel Cirri, NP  omeprazole (PRILOSEC) 20 MG capsule Take 1 capsule (20 mg total) by mouth every other day. 10/20/17   Gabriel Cirri, NP  ondansetron (ZOFRAN ODT) 4 MG disintegrating tablet Take 1 tablet (4 mg total) by mouth every 8 (eight) hours as needed for nausea or vomiting. 03/13/18   Jene Every, MD  rosuvastatin (CRESTOR) 5  MG tablet Take 1 tablet (5 mg total) by mouth daily. 10/20/17   Gabriel Cirri, NP     Allergies Phenameth [promethazine] and Statins   Family History  Problem Relation Age of Onset  . Hypertension Mother   . Thyroid disease Daughter   . Heart disease Son        heart attack    Social History Social History   Tobacco Use  . Smoking status: Never Smoker  . Smokeless tobacco: Never Used  Substance Use Topics  . Alcohol use: No    Alcohol/week: 0.0 oz  . Drug use: No    Review of Systems  Constitutional:   No fever or  chills.  ENT:   No sore throat. No rhinorrhea. Cardiovascular:   No chest pain or syncope. Respiratory:   Positive intermittent shortness of breath without cough. Gastrointestinal:   Negative for abdominal pain, vomiting and diarrhea.  Musculoskeletal:   Negative for focal pain or swelling All other systems reviewed and are negative except as documented above in ROS and HPI.  ____________________________________________   PHYSICAL EXAM:  VITAL SIGNS: ED Triage Vitals  Enc Vitals Group     BP 03/23/18 1324 (!) 196/103     Pulse Rate 03/23/18 1324 71     Resp 03/23/18 1324 16     Temp 03/23/18 1324 98.7 F (37.1 C)     Temp Source 03/23/18 1324 Oral     SpO2 03/23/18 1324 98 %     Weight 03/23/18 1325 201 lb (91.2 kg)     Height 03/23/18 1325 5\' 4"  (1.626 m)     Head Circumference --      Peak Flow --      Pain Score 03/23/18 1325 0     Pain Loc --      Pain Edu? --      Excl. in GC? --     Vital signs reviewed, nursing assessments reviewed.   Constitutional:   Alert and oriented. Non-toxic appearance. Eyes:   Conjunctivae are normal. EOMI. PERRL. ENT      Head:   Normocephalic and atraumatic.      Nose:   No congestion/rhinnorhea.       Mouth/Throat:   MMM, no pharyngeal erythema. No peritonsillar mass.       Neck:   No meningismus. Full ROM.  Normal jugular venous pressure Hematological/Lymphatic/Immunilogical:   No cervical lymphadenopathy. Cardiovascular:   RRR. Symmetric bilateral radial and DP pulses.  No murmurs. Cap refill less than 2 seconds. Respiratory:   Normal respiratory effort without tachypnea/retractions. Breath sounds are clear and equal bilaterally. No wheezes/rales/rhonchi. Gastrointestinal:   Soft and nontender. Non distended. There is no CVA tenderness.  No rebound, rigidity, or guarding. Musculoskeletal:   Normal range of motion in all extremities. No joint effusions.  No lower extremity tenderness.  No edema. Neurologic:   Normal speech and  language.  Motor grossly intact. No acute focal neurologic deficits are appreciated.  Skin:    Skin is warm, dry and intact. No rash noted.  No petechiae, purpura, or bullae.  ____________________________________________    LABS (pertinent positives/negatives) (all labs ordered are listed, but only abnormal results are displayed) Labs Reviewed  BASIC METABOLIC PANEL - Abnormal; Notable for the following components:      Result Value   Sodium 134 (*)    Glucose, Bld 270 (*)    BUN 46 (*)    Creatinine, Ser 2.00 (*)    Calcium 8.5 (*)  GFR calc non Af Amer 22 (*)    GFR calc Af Amer 25 (*)    All other components within normal limits  CBC - Abnormal; Notable for the following components:   RDW 19.2 (*)    All other components within normal limits  URINALYSIS, COMPLETE (UACMP) WITH MICROSCOPIC - Abnormal; Notable for the following components:   Color, Urine STRAW (*)    APPearance CLEAR (*)    Glucose, UA 50 (*)    Hgb urine dipstick SMALL (*)    Protein, ur >=300 (*)    All other components within normal limits  TROPONIN I - Abnormal; Notable for the following components:   Troponin I 0.04 (*)    All other components within normal limits  CBG MONITORING, ED   ____________________________________________   EKG  Interpreted by me Ventricular paced rhythm, left bundle branch block, left axis, no acute ischemic changes.  Rate of 79.  ____________________________________________    RADIOLOGY  Dg Chest 2 View  Result Date: 03/23/2018 CLINICAL DATA:  Shortness of breath and weakness for 1 week. History of heart murmur, pacemaker, hypertension, diabetes. EXAM: CHEST - 2 VIEW COMPARISON:  Chest x-ray dated 06/19/2016. FINDINGS: Heart size and mediastinal contours are stable. There is central pulmonary vascular congestion. No overt alveolar pulmonary edema. No confluent opacity to suggest a developing pneumonia. No pleural effusion or pneumothorax seen. Stable kyphosis of the  thoracic spine. No acute or suspicious osseous finding. Aortic atherosclerosis. Pacemaker leads appear stable in position. IMPRESSION: 1. Cardiomegaly with central pulmonary vascular congestion indicating mild CHF/volume overload. No evidence of overt alveolar pulmonary edema. 2. No evidence of pneumonia. 3. Aortic atherosclerosis. Electronically Signed   By: Bary RichardStan  Maynard M.D.   On: 03/23/2018 13:56    ____________________________________________   PROCEDURES Procedures  ____________________________________________    CLINICAL IMPRESSION / ASSESSMENT AND PLAN / ED COURSE  Pertinent labs & imaging results that were available during my care of the patient were reviewed by me and considered in my medical decision making (see chart for details).  pace Maker interrogated, no significant events. Patient presents with generalized weakness, episodic anxiety, shortness of breath it is worse with exertion but no orthopnea symptoms or PND.  Chest x-ray suggest some mild CHF, patient does not have significant peripheral edema or elevated JVP.  Labs unremarkable.  May be entirely due to anxiety, but I will have her increase her Lasix to twice daily for today and tomorrow and follow-up with the heart failure clinic.  Creatinine is consistent with her baseline CKD, and electrolytes are unremarkable.  Doubt ACS PE dissection or myocarditis.      ____________________________________________   FINAL CLINICAL IMPRESSION(S) / ED DIAGNOSES    Final diagnoses:  Generalized weakness  Chronic congestive heart failure, unspecified heart failure type [facility](HCC)  Essential hypertension     ED Discharge Orders    None      Portions of this note were generated with dragon dictation software. Dictation errors may occur despite best attempts at proofreading.    Sharman CheekStafford, Kristalyn Bergstresser, MD 03/23/18 [phone]

## 2018-03-23 NOTE — ED Notes (Signed)
Pt presents with continued fatigue and weakness, worsening today. Pt has had recent admissions for heart failure and dehydration. She reports that she has felt weak for "a while." Daughter concerned that pt may have some anxiety surrounding recent medical problems. Pt alert & oriented with NAD noted.

## 2018-03-23 NOTE — ED Notes (Signed)
[employer] called and stated that pacemaker is operating as programmed with no incidents since last checkup in July 2018

## 2018-04-07 ENCOUNTER — Encounter: Payer: Self-pay | Admitting: Family

## 2018-04-07 ENCOUNTER — Ambulatory Visit: Payer: Medicare Other | Attending: Family | Admitting: Family

## 2018-04-07 VITALS — BP 152/80 | HR 72 | Resp 18 | Ht 63.0 in | Wt 205.4 lb

## 2018-04-07 DIAGNOSIS — Z7984 Long term (current) use of oral hypoglycemic drugs: Secondary | ICD-10-CM | POA: Diagnosis not present

## 2018-04-07 DIAGNOSIS — E785 Hyperlipidemia, unspecified: Secondary | ICD-10-CM | POA: Insufficient documentation

## 2018-04-07 DIAGNOSIS — I130 Hypertensive heart and chronic kidney disease with heart failure and stage 1 through stage 4 chronic kidney disease, or unspecified chronic kidney disease: Secondary | ICD-10-CM | POA: Insufficient documentation

## 2018-04-07 DIAGNOSIS — K219 Gastro-esophageal reflux disease without esophagitis: Secondary | ICD-10-CM | POA: Insufficient documentation

## 2018-04-07 DIAGNOSIS — I5032 Chronic diastolic (congestive) heart failure: Secondary | ICD-10-CM | POA: Diagnosis not present

## 2018-04-07 DIAGNOSIS — E1122 Type 2 diabetes mellitus with diabetic chronic kidney disease: Secondary | ICD-10-CM | POA: Diagnosis not present

## 2018-04-07 DIAGNOSIS — Z8249 Family history of ischemic heart disease and other diseases of the circulatory system: Secondary | ICD-10-CM | POA: Insufficient documentation

## 2018-04-07 DIAGNOSIS — I509 Heart failure, unspecified: Secondary | ICD-10-CM | POA: Diagnosis present

## 2018-04-07 DIAGNOSIS — Z888 Allergy status to other drugs, medicaments and biological substances status: Secondary | ICD-10-CM | POA: Diagnosis not present

## 2018-04-07 DIAGNOSIS — Z950 Presence of cardiac pacemaker: Secondary | ICD-10-CM | POA: Insufficient documentation

## 2018-04-07 DIAGNOSIS — N189 Chronic kidney disease, unspecified: Secondary | ICD-10-CM | POA: Diagnosis not present

## 2018-04-07 DIAGNOSIS — Z90710 Acquired absence of both cervix and uterus: Secondary | ICD-10-CM | POA: Insufficient documentation

## 2018-04-07 DIAGNOSIS — I10 Essential (primary) hypertension: Secondary | ICD-10-CM

## 2018-04-07 DIAGNOSIS — Z79899 Other long term (current) drug therapy: Secondary | ICD-10-CM | POA: Diagnosis not present

## 2018-04-07 DIAGNOSIS — Z8349 Family history of other endocrine, nutritional and metabolic diseases: Secondary | ICD-10-CM | POA: Insufficient documentation

## 2018-04-07 DIAGNOSIS — N184 Chronic kidney disease, stage 4 (severe): Secondary | ICD-10-CM

## 2018-04-07 NOTE — Patient Instructions (Signed)
Continue weighing daily and call for an overnight weight gain of > 2 pounds or a weekly weight gain of >5 pounds. 

## 2018-04-07 NOTE — Progress Notes (Signed)
Patient ID: Patty Roberts, female    DOB: July 15, 1932, 84 y.o.   MRN: [id number]  HPI  Ms Sebring is a 84 y/o female with a history of DM, hyperlipidemia, HTN, CKD, hyponatremia, GERD and chronic heart failure.   Echo report from 12/21/17 reviewed and showed an EF of 60-65% along with mild AS with normal PA pressure.  Was in the ED 03/23/18 due to weakness and shortness of breath where she was evaluated and released. Lasix was increased for a couple of days. Was in the ED 03/13/18 due to fatigue and elevated glucose. Labs indicated dehydration. IV fluids given and she was released. Admitted 12/20/17 due to new onset HF and hyponatremia. Was initially on lasix which had to be stopped. Needed 3% saline with consult with nephrology. Discharged after 4 days.   She presents today for a follow-up visit with a chief complaint of easy bruising. She says this this has been present for several years. She has associated gradual weight gain along with this. She denies any difficulty sleeping, abdominal distention, palpitations, pedal edema, chest pain, shortness of breath, cough or dizziness. Says that she hasn't taken her lasix in about a week.   Past Medical History:  Diagnosis Date  . CHF (congestive heart failure) (HCC)   . Chronic kidney disease   . Diabetes mellitus without complication (HCC)   . GERD (gastroesophageal reflux disease)   . Hyperlipidemia   . Hypertension   . Hyponatremia    Past Surgical History:  Procedure Laterality Date  . ABDOMINAL HYSTERECTOMY    . ANKLE FRACTURE SURGERY    . HERNIA REPAIR    . PACEMAKER INSERTION     x2  . vertebral fix     Family History  Problem Relation Age of Onset  . Hypertension Mother   . Thyroid disease Daughter   . Heart disease Son        heart attack   Social History   Tobacco Use  . Smoking status: Never Smoker  . Smokeless tobacco: Never Used  Substance Use Topics  . Alcohol use: No    Alcohol/week: 0.0 oz   Allergies  Allergen  Reactions  . Phenameth [Promethazine]   . Statins Other (See Comments)    Leg pain    Prior to Admission medications   Medication Sig Start Date End Date Taking? Authorizing Provider  albuterol (PROVENTIL HFA;VENTOLIN HFA) 108 (90 Base) MCG/ACT inhaler Inhale 2 puffs into the lungs every 6 (six) hours as needed for wheezing or shortness of breath. 12/19/17  Yes Particia Nearing, PA-C  amLODipine (NORVASC) 5 MG tablet Take 5 mg by mouth daily.   Yes [provider]  carvedilol (COREG) 12.5 MG tablet Take 1 tablet (12.5 mg total) by mouth 2 (two) times daily with a meal. Instead of Bystolic 01/05/18  Yes Gabriel Cirri, NP  cloNIDine (CATAPRES - DOSED IN MG/24 HR) 0.3 mg/24hr patch APPLY 1 PATCH TO SKIN ONCE A WEEK 10/20/17  Yes Gabriel Cirri, NP  glimepiride (AMARYL) 2 MG tablet Take 2 mg by mouth 2 (two) times daily before a meal. 03/03/18  Yes [provider]  meclizine (ANTIVERT) 25 MG tablet TAKE 1 TABLET TWICE A DAY AS NEEDED FOR DIZZINESS 03/10/18  Yes Gabriel Cirri, NP  omeprazole (PRILOSEC) 20 MG capsule Take 1 capsule (20 mg total) by mouth every other day. 10/20/17  Yes Gabriel Cirri, NP  ondansetron (ZOFRAN ODT) 4 MG disintegrating tablet Take 1 tablet (4 mg total) by mouth  every 8 (eight) hours as needed for nausea or vomiting. 03/13/18  Yes Jene EveryKinner, Robert, MD  rosuvastatin (CRESTOR) 5 MG tablet Take 1 tablet (5 mg total) by mouth daily. 10/20/17  Yes [name], [name], NP   Review of Systems  Constitutional: Negative for appetite change and fatigue.  HENT: Positive for rhinorrhea. Negative for congestion and sore throat.   Eyes: Negative.   Respiratory: Negative for cough, chest tightness and shortness of breath.   Cardiovascular: Negative for chest pain, palpitations and leg swelling.  Gastrointestinal: Negative for abdominal distention and abdominal pain.  Endocrine: Negative.   Genitourinary: Negative.   Musculoskeletal: Negative for back pain and neck pain.   Skin: Negative.   Allergic/Immunologic: Negative.   Neurological: Negative for dizziness and light-headedness.  Hematological: Negative for adenopathy. Bruises/bleeds easily.  Psychiatric/Behavioral: Negative for dysphoric mood and sleep disturbance. The patient is not nervous/anxious.    Vitals:   04/07/18 1223  BP: (!) 193/82  Pulse: 72  Resp: 18  SpO2: 98%  Weight: 205 lb 6 oz (93.2 kg)  Height: 5\' 3"  (1.6 m)   Wt Readings from Last 3 Encounters:  04/07/18 205 lb 6 oz (93.2 kg)  03/23/18 201 lb (91.2 kg)  03/13/18 201 lb (91.2 kg)   Lab Results  Component Value Date   CREATININE 2.00 (H) 03/23/2018   CREATININE 1.52 (H) 03/13/2018   CREATININE 2.29 (H) 12/23/2017    Physical Exam  Constitutional: She is oriented to person, place, and time. She appears well-developed and well-nourished.  HENT:  Head: Normocephalic and atraumatic.  Neck: Normal range of motion. Neck supple. No JVD present.  Cardiovascular: Normal rate and regular rhythm.  Pulmonary/Chest: Effort normal. No respiratory distress. She has no wheezes. She has no rales.  Abdominal: Soft. She exhibits no distension.  Musculoskeletal: She exhibits no edema or deformity.  Neurological: She is alert and oriented to person, place, and time.  Skin: Skin is warm and dry.  Psychiatric: She has a normal mood and affect. Her behavior is normal.  Nursing note and vitals reviewed.  Assessment & Plan:  1: Chronic heart failure with preserved ejection fraction- - NYHA class I - euvolemic today - weighing daily and says that her weight has risen slightly. Reminded to call for an overnight weight gain of >2 pounds or a weekly weight gain of >5 pounds - weight up ~ 3 pounds since she was last here 6 weeks ago - not adding salt but does eat out often. Reviewed the importance of closely following a 2000mg  sodium diet. - has appointment with cardiology Mariah Milling([place]) 05/11/18 - BNP 12/23/17 was 300.0  2: HTN- - BP elevated  initially (193/82) but had improved upon recheck (152/80) - saw PCP Jodi Marble([place]) 03/13/18 - BMP 03/23/18 reviewed and showed sodium 134, potassium 5.0 and GFR 25   3: DM- - fasting glucose yesterday morning was 208  - saw nephrology Thedore Mins([place]) recently - A1c 01/05/18 was 6.8%  Medication list was reviewed with patient.  Patient opts to not make a return appointment at this time. Advised patient to call at anytime to make another appointment.

## 2018-04-08 ENCOUNTER — Telehealth: Payer: Self-pay | Admitting: Family Medicine

## 2018-04-08 NOTE — Telephone Encounter (Signed)
Called pt to reschedule appointments for 04/16/18 pt will check with daughter on dates and call back

## 2018-04-14 ENCOUNTER — Ambulatory Visit: Payer: Medicare Other | Admitting: Unknown Physician Specialty

## 2018-04-15 ENCOUNTER — Ambulatory Visit: Payer: Medicare Other | Admitting: Family Medicine

## 2018-04-16 ENCOUNTER — Ambulatory Visit: Payer: Medicare Other

## 2018-04-16 ENCOUNTER — Ambulatory Visit: Payer: Medicare Other | Admitting: Family Medicine

## 2018-04-17 ENCOUNTER — Ambulatory Visit: Payer: Medicare Other | Admitting: Unknown Physician Specialty

## 2018-04-21 ENCOUNTER — Ambulatory Visit: Payer: Medicare Other | Admitting: Physician Assistant

## 2018-04-22 ENCOUNTER — Ambulatory Visit: Payer: Medicare Other | Admitting: Unknown Physician Specialty

## 2018-05-11 ENCOUNTER — Encounter

## 2018-05-11 ENCOUNTER — Ambulatory Visit: Payer: Medicare Other | Admitting: Cardiovascular Disease

## 2018-05-15 ENCOUNTER — Encounter: Payer: Self-pay | Admitting: Unknown Physician Specialty

## 2018-05-21 ENCOUNTER — Other Ambulatory Visit: Payer: Self-pay | Admitting: Family Medicine

## 2018-05-21 NOTE — Telephone Encounter (Signed)
proair HFA inhaler refill Last Refill:12/19/17 # 1 Last OV: 03/13/18 PCP: R.Lane Pharmacy: [facility] [phone]

## 2018-05-25 ENCOUNTER — Telehealth: Payer: Self-pay | Admitting: Unknown Physician Specialty

## 2018-05-25 MED ORDER — GUAIFENESIN ER 600 MG PO TB12: 600.0000 mg | ORAL_TABLET | Freq: Two times a day (BID) | ORAL | 0 refills | Status: AC | PRN

## 2018-05-25 NOTE — Telephone Encounter (Signed)
Copied from CRM [phone]. Topic: Quick Communication - See Telephone Encounter >> May 25, 2018  8:16 AM Trula SladeWalter, Linda F wrote: CRM for notification. See Telephone encounter for: 05/25/18. Patient would like to know if something can be called in to control her mucus. She is using her inhaler but it's not working.

## 2018-05-25 NOTE — Telephone Encounter (Signed)
Sent over some mucinex to help keep it thinned out

## 2018-05-25 NOTE — Telephone Encounter (Signed)
Message relayed to patient. Verbalized understanding and denied questions.   

## 2018-05-29 ENCOUNTER — Telehealth: Payer: Self-pay | Admitting: Physician Assistant

## 2018-05-29 NOTE — Telephone Encounter (Signed)
Mucinex is an expectorant which means it is meant to make you cough up      Copied from CRM [id number]. Topic: General - Other >> May 29, 2018  8:22 AM Ronney Lion A wrote: Reason for CRM: Patient called in saying she would like a stronger medication other than the mucinex, because it's not helping her with the  Phlegm she's coughing up.   Please advise.

## 2018-05-29 NOTE — Telephone Encounter (Signed)
Patient notified

## 2018-05-29 NOTE — Telephone Encounter (Signed)
Pt called back in to follow up, relayed message to pt. Pt would like to know if provider is going to call her something else in to pharmacy? Rachael previously prescribed the Mucinex that is not helping.    Please advise.    CB: [phone]

## 2018-05-29 NOTE — Telephone Encounter (Signed)
She would need to be seen to have anything stronger called in

## 2018-06-02 ENCOUNTER — Ambulatory Visit (INDEPENDENT_AMBULATORY_CARE_PROVIDER_SITE_OTHER): Payer: Medicare Other | Admitting: Family Medicine

## 2018-06-02 ENCOUNTER — Encounter: Payer: Self-pay | Admitting: Family Medicine

## 2018-06-02 ENCOUNTER — Other Ambulatory Visit: Payer: Self-pay

## 2018-06-02 ENCOUNTER — Other Ambulatory Visit: Payer: Self-pay | Admitting: Unknown Physician Specialty

## 2018-06-02 VITALS — BP 130/74 | HR 70 | Temp 98.4°F | Ht 63.0 in | Wt 206.0 lb

## 2018-06-02 DIAGNOSIS — IMO0002 Reserved for concepts with insufficient information to code with codable children: Secondary | ICD-10-CM

## 2018-06-02 DIAGNOSIS — E7800 Pure hypercholesterolemia, unspecified: Secondary | ICD-10-CM

## 2018-06-02 DIAGNOSIS — N184 Chronic kidney disease, stage 4 (severe): Secondary | ICD-10-CM

## 2018-06-02 DIAGNOSIS — R05 Cough: Secondary | ICD-10-CM

## 2018-06-02 DIAGNOSIS — I10 Essential (primary) hypertension: Secondary | ICD-10-CM | POA: Diagnosis not present

## 2018-06-02 DIAGNOSIS — R059 Cough, unspecified: Secondary | ICD-10-CM

## 2018-06-02 DIAGNOSIS — E1165 Type 2 diabetes mellitus with hyperglycemia: Secondary | ICD-10-CM

## 2018-06-02 DIAGNOSIS — I5032 Chronic diastolic (congestive) heart failure: Secondary | ICD-10-CM

## 2018-06-02 DIAGNOSIS — E1122 Type 2 diabetes mellitus with diabetic chronic kidney disease: Secondary | ICD-10-CM | POA: Diagnosis not present

## 2018-06-02 MED ORDER — BENZONATATE 200 MG PO CAPS: 200.0000 mg | ORAL_CAPSULE | Freq: Three times a day (TID) | ORAL | 0 refills | Status: DC | PRN

## 2018-06-02 NOTE — Patient Instructions (Signed)
Follow up in 3 months

## 2018-06-02 NOTE — Progress Notes (Signed)
BP 130/74   Pulse 70   Temp 98.4 F (36.9 C) (Oral)   Ht 5\' 3"  (1.6 m)   Wt 206 lb (93.4 kg)   LMP  (LMP Unknown)   SpO2 95%   BMI 36.49 kg/m    Subjective:    Patient ID: Patty Roberts, female    DOB: [birth year], 84 y.o.   MRN: [id number]  HPI: Patty Roberts is a 84 y.o. female  Chief Complaint  Patient presents with  . Diabetes  . Hypertension  . Sinusitis   Here today for 3 month f/u chronic conditions.   Dry weight is around 203. Adherent to the low sodium diet. Followed by HF Clinic.  No orthopnea, LE edema, SOB, CP.   Bs have been running 180s-200s. Takes 1/2 glimepiride in morning and 1/2 in evening. Medications have been decreased as she was having hypoglycemic episodes and fatigue. Diet stable, feels she eats well.   Saw Nephrology in April for her CKD.   Cold sxs for several weeks. Seems to be feeling better but still having a hacking cough and some wheezing. Denies fevers, CP,  SOB.   Past Medical History:  Diagnosis Date  . CHF (congestive heart failure) (HCC)   . Chronic kidney disease   . Diabetes mellitus without complication (HCC)   . GERD (gastroesophageal reflux disease)   . Hyperlipidemia   . Hypertension   . Hyponatremia    Social History   Socioeconomic History  . Marital status: Widowed    Spouse name: Not on file  . Number of children: Not on file  . Years of education: Not on file  . Highest education level: Not on file  Occupational History  . Not on file  Social Needs  . Financial resource strain: Not on file  . Food insecurity:    Worry: Not on file    Inability: Not on file  . Transportation needs:    Medical: Not on file    Non-medical: Not on file  Tobacco Use  . Smoking status: Never Smoker  . Smokeless tobacco: Never Used  Substance and Sexual Activity  . Alcohol use: No    Alcohol/week: 0.0 standard drinks  . Drug use: No  . Sexual activity: Not Currently  Lifestyle  . Physical activity:    Days per week: Not on  file    Minutes per session: Not on file  . Stress: Not on file  Relationships  . Social connections:    Talks on phone: Not on file    Gets together: Not on file    Attends religious service: Not on file    Active member of club or organization: Not on file    Attends meetings of clubs or organizations: Not on file    Relationship status: Not on file  . Intimate partner violence:    Fear of current or ex partner: Not on file    Emotionally abused: Not on file    Physically abused: Not on file    Forced sexual activity: Not on file  Other Topics Concern  . Not on file  Social History Narrative  . Not on file    Relevant past medical, surgical, family and social history reviewed and updated as indicated. Interim medical history since our last visit reviewed. Allergies and medications reviewed and updated.  Review of Systems  Per HPI unless specifically indicated above     Objective:    BP 130/74   Pulse 70  Temp 98.4 F (36.9 C) (Oral)   Ht 5\' 3"  (1.6 m)   Wt 206 lb (93.4 kg)   LMP  (LMP Unknown)   SpO2 95%   BMI 36.49 kg/m   Wt Readings from Last 3 Encounters:  06/02/18 206 lb (93.4 kg)  04/07/18 205 lb 6 oz (93.2 kg)  03/23/18 201 lb (91.2 kg)    Physical Exam  Constitutional: She appears well-developed and well-nourished.  HENT:  Head: Atraumatic.  Right Ear: External ear normal.  Left Ear: External ear normal.  Nose: Nose normal.  Mouth/Throat: Oropharynx is clear and moist.  Eyes: Conjunctivae and EOM are normal.  Neck: Normal range of motion. Neck supple.  Cardiovascular: Normal rate, regular rhythm and intact distal pulses.  Pulmonary/Chest: Effort normal and breath sounds normal. No respiratory distress. She has no wheezes. She has no rales.  Musculoskeletal: Normal range of motion. She exhibits no edema.  Neurological: She is alert.  Skin: Skin is warm and dry.  Psychiatric: She has a normal mood and affect. Her behavior is normal.  Nursing note  and vitals reviewed.   Results for orders placed or performed in visit on 06/02/18  Lipid Panel w/o Chol/HDL Ratio  Result Value Ref Range   Cholesterol, Total 176 100 - 199 mg/dL   Triglycerides 161 (H) 0 - 149 mg/dL   HDL 49 >09 mg/dL   VLDL Cholesterol Cal 49 (H) 5 - 40 mg/dL   LDL Calculated 78 0 - 99 mg/dL  Comprehensive metabolic panel  Result Value Ref Range   Glucose 167 (H) 65 - 99 mg/dL   BUN 20 8 - 27 mg/dL   Creatinine, Ser 6.04 (H) 0.57 - 1.00 mg/dL   GFR calc non Af Amer 26 (L) >59 mL/min/1.73   GFR calc Af Amer 30 (L) >59 mL/min/1.73   BUN/Creatinine Ratio 11 (L) 12 - 28   Sodium 140 134 - 144 mmol/L   Potassium 4.3 3.5 - 5.2 mmol/L   Chloride 102 96 - 106 mmol/L   CO2 25 20 - 29 mmol/L   Calcium 8.1 (L) 8.7 - 10.3 mg/dL   Total Protein 6.0 6.0 - 8.5 g/dL   Albumin 3.2 (L) 3.5 - 4.7 g/dL   Globulin, Total 2.8 1.5 - 4.5 g/dL   Albumin/Globulin Ratio 1.1 (L) 1.2 - 2.2   Bilirubin Total 0.3 0.0 - 1.2 mg/dL   Alkaline Phosphatase 58 39 - 117 IU/L   AST 18 0 - 40 IU/L   ALT 7 0 - 32 IU/L  HgB A1c  Result Value Ref Range   Hgb A1c MFr Bld 9.9 (H) 4.8 - 5.6 %   Est. average glucose Bld gHb Est-mCnc 237 mg/dL      Assessment & Plan:   Problem List Items Addressed This Visit      Cardiovascular and Mediastinum   Chronic diastolic heart failure (HCC) (Chronic)    Followed by HF Clinic. Appears euvolemic today. Continue current regimen.       Relevant Medications   furosemide (LASIX) 40 MG tablet   HTN (hypertension) - Primary (Chronic)    Stable, continue current regimen. Continue working on weight loss, increasing exercise.       Relevant Medications   furosemide (LASIX) 40 MG tablet     Genitourinary   CKD (chronic kidney disease) stage 4, GFR 15-29 ml/min (HCC)    Followed by Nephrology, continue per their instruction        Other   Hyperlipidemia  Recheck lipids today, continue current regimen and adjust as needed      Relevant Medications    furosemide (LASIX) 40 MG tablet   Other Relevant Orders   Lipid Panel w/o Chol/HDL Ratio (Completed)    Other Visit Diagnoses    Uncontrolled type 2 diabetes mellitus with stage 4 chronic kidney disease, without long-term current use of insulin (HCC)       Recheck A1C. Home sugars consistently high on current 1 mg dose amaryl BID. WIll likely need to increase regimen and watch carefully for hypoglycemia   Relevant Orders   Comprehensive metabolic panel (Completed)   HgB A1c (Completed)   Cough       Other sxs have improved, no evidence of bacterial infection. Tx with tessalon perles, honey tea, cough drops. Return precautions reviewed       Follow up plan: Return in about 3 months (around 09/02/2018) for A1C, BP.

## 2018-06-02 NOTE — Telephone Encounter (Signed)
Requested Prescriptions  Pending Prescriptions Disp Refills  . rosuvastatin (CRESTOR) 5 MG tablet [Pharmacy Med Name: ROSUVASTATIN TABS 5MG ] 90 tablet 0    Sig: TAKE 1 TABLET DAILY     Cardiovascular:  Antilipid - Statins Failed - 06/02/2018  1:20 PM      Failed - Total Cholesterol in normal range and within 360 days    Cholesterol, Total  Date Value Ref Range Status  09/17/2016 199 100 - 199 mg/dL Final   Cholesterol Piccolo, Waived  Date Value Ref Range Status  04/12/2015 154 <200 mg/dL Final    Comment:                            Desirable                <200                         Borderline High      200- 239                         High                     >239          Failed - LDL in normal range and within 360 days    LDL Calculated  Date Value Ref Range Status  09/17/2016 110 (H) 0 - 99 mg/dL Final         Failed - HDL in normal range and within 360 days    HDL  Date Value Ref Range Status  09/17/2016 58 >39 mg/dL Final         Failed - Triglycerides in normal range and within 360 days    Triglycerides  Date Value Ref Range Status  09/17/2016 154 (H) 0 - 149 mg/dL Final   Triglycerides Piccolo,Waived  Date Value Ref Range Status  04/12/2015 253 (H) <150 mg/dL Final    Comment:                            Normal                   <150                         Borderline High     150 - 199                         High                200 - 499                         Very High                >499          Passed - Patient is not pregnant      Passed - Valid encounter within last 12 months    Recent Outpatient Visits          Today Essential hypertension   [facility] Particia Nearing, [place]   2 months ago Chronic diastolic heart failure [facility])   [facility] [place], Ricki Rodriguez M, PA-C   4 months ago  Irregular heart rate   [facility] Gabriel Cirri, NP   5 months ago Controlled type 2 diabetes mellitus with stage  4 chronic kidney disease, without long-term current use of insulin [facility])   [facility] Roosvelt Maser [place], [place]   7 months ago Hypoglycemia   [facility] Gabriel Cirri, NP      Future Appointments            In 3 months Maurice March, Salley Hews, PA-C [facility], [facility]

## 2018-06-03 ENCOUNTER — Telehealth: Payer: Self-pay | Admitting: Family Medicine

## 2018-06-03 LAB — HEMOGLOBIN A1C
ESTIMATED AVERAGE GLUCOSE: 237 mg/dL
Hgb A1c MFr Bld: 9.9 % — ABNORMAL HIGH (ref 4.8–5.6)

## 2018-06-03 LAB — COMPREHENSIVE METABOLIC PANEL
A/G RATIO: 1.1 — AB (ref 1.2–2.2)
ALT: 7 IU/L (ref 0–32)
AST: 18 IU/L (ref 0–40)
Albumin: 3.2 g/dL — ABNORMAL LOW (ref 3.5–4.7)
Alkaline Phosphatase: 58 IU/L (ref 39–117)
BUN / CREAT RATIO: 11 — AB (ref 12–28)
BUN: 20 mg/dL (ref 8–27)
Bilirubin Total: 0.3 mg/dL (ref 0.0–1.2)
CALCIUM: 8.1 mg/dL — AB (ref 8.7–10.3)
CO2: 25 mmol/L (ref 20–29)
Chloride: 102 mmol/L (ref 96–106)
Creatinine, Ser: 1.77 mg/dL — ABNORMAL HIGH (ref 0.57–1.00)
GFR calc Af Amer: 30 mL/min/{1.73_m2} — ABNORMAL LOW (ref >59)
GFR, EST NON AFRICAN AMERICAN: 26 mL/min/{1.73_m2} — AB (ref >59)
GLOBULIN, TOTAL: 2.8 g/dL (ref 1.5–4.5)
Glucose: 167 mg/dL — ABNORMAL HIGH (ref 65–99)
Potassium: 4.3 mmol/L (ref 3.5–5.2)
SODIUM: 140 mmol/L (ref 134–144)
Total Protein: 6 g/dL (ref 6.0–8.5)

## 2018-06-03 LAB — LIPID PANEL W/O CHOL/HDL RATIO
Cholesterol, Total: 176 mg/dL (ref 100–199)
HDL: 49 mg/dL (ref >39)
LDL Calculated: 78 mg/dL (ref 0–99)
Triglycerides: 243 mg/dL — ABNORMAL HIGH (ref 0–149)
VLDL CHOLESTEROL CAL: 49 mg/dL — AB (ref 5–40)

## 2018-06-03 MED ORDER — GLIPIZIDE 5 MG PO TABS: 5.0000 mg | ORAL_TABLET | Freq: Two times a day (BID) | ORAL | 3 refills | Status: AC

## 2018-06-03 NOTE — Telephone Encounter (Signed)
Her labs were stable other than her A1C being elevated. I've sent over glipizide for her to take in place of the glimepiride, 5 mg twice daily. Call with low blood sugar spells. Work on diet and exercise

## 2018-06-04 ENCOUNTER — Telehealth: Payer: Self-pay | Admitting: Family Medicine

## 2018-06-04 NOTE — Telephone Encounter (Signed)
Patient notified

## 2018-06-04 NOTE — Telephone Encounter (Signed)
She should not be taking DM medicine because of her blood pressure. Can you check what else she needs?

## 2018-06-04 NOTE — Assessment & Plan Note (Signed)
Recheck lipids today, continue current regimen and adjust as needed 

## 2018-06-04 NOTE — Telephone Encounter (Signed)
Message relayed to patient. Verbalized understanding and denied questions.   

## 2018-06-04 NOTE — Telephone Encounter (Signed)
Copied from CRM [id number]. Topic: General - Other >> Jun 04, 2018  4:15 PM Tamela Oddi wrote: Reason for CRM: Patient called to speak with the doctor or nurse regarding which medicine she could take.  Patient is confused about cough medicine with DM and needs the doctor to clarify.  Please advise.  CB# [phone]

## 2018-06-04 NOTE — Assessment & Plan Note (Signed)
Stable, continue current regimen. Continue working on weight loss, increasing exercise.

## 2018-06-04 NOTE — Assessment & Plan Note (Signed)
Followed by HF Clinic. Appears euvolemic today. Continue current regimen.

## 2018-06-04 NOTE — Assessment & Plan Note (Signed)
Followed by Nephrology, continue per their instruction

## 2018-06-17 ENCOUNTER — Ambulatory Visit: Payer: Self-pay

## 2018-06-17 NOTE — Telephone Encounter (Signed)
That is absolutely fine, but if she has increased symptoms with sinuses then she would benefit from seeing me.

## 2018-06-17 NOTE — Telephone Encounter (Signed)
Called patient left VM to call back at her convience

## 2018-06-17 NOTE — Telephone Encounter (Signed)
Pt called for advice only. Pt informed that question would be routed back to her doctor since she is diabetic I am unable to advise. Reason for Disposition . [1] Caller requesting NON-URGENT health information AND [2] PCP's office is the best resource  Answer Assessment - Initial Assessment Questions 1. REASON FOR CALL or QUESTION: "What is your reason for calling today?" or "How can I best help you?" or "What question do you have that I can help answer?"     Pt wants to know if its ok to take a teaspoon of elderberry syrup and honey each day for her sinuses  Protocols used: INFORMATION ONLY CALL-A-AH

## 2018-06-29 ENCOUNTER — Ambulatory Visit (INDEPENDENT_AMBULATORY_CARE_PROVIDER_SITE_OTHER): Payer: Medicare Other | Admitting: Family Medicine

## 2018-06-29 ENCOUNTER — Encounter: Payer: Self-pay | Admitting: Family Medicine

## 2018-06-29 ENCOUNTER — Ambulatory Visit: Payer: Self-pay | Admitting: *Deleted

## 2018-06-29 VITALS — HR 71 | Temp 98.2°F | Wt 207.2 lb

## 2018-06-29 DIAGNOSIS — R062 Wheezing: Secondary | ICD-10-CM | POA: Diagnosis not present

## 2018-06-29 NOTE — Progress Notes (Signed)
Pulse 71   Temp 98.2 F (36.8 C)   Wt 207 lb 4 oz (94 kg)   LMP  (LMP Unknown)   SpO2 97%   BMI 36.71 kg/m    Subjective:    Patient ID: Patty Roberts, female    DOB: [birth year], 84 y.o.   MRN: [id number]  HPI: Patty Roberts is a 84 y.o. female  Chief Complaint  Patient presents with  . Wheezing   Here today with intermittent wheezing the past few weeks since coming down with a cold. Feeling better overall, Tessalon and mucinex helping quite a bit and no longer coughing much. Daughter was noticing some occasional wheezing and wanted her to be seen. Denies Cp, SOB, fevers, productive cough. No known hx of asthma, COPD. Non smoker.   Relevant past medical, surgical, family and social history reviewed and updated as indicated. Interim medical history since our last visit reviewed. Allergies and medications reviewed and updated.  Review of Systems  Per HPI unless specifically indicated above     Objective:    Pulse 71   Temp 98.2 F (36.8 C)   Wt 207 lb 4 oz (94 kg)   LMP  (LMP Unknown)   SpO2 97%   BMI 36.71 kg/m   Wt Readings from Last 3 Encounters:  06/29/18 207 lb 4 oz (94 kg)  06/02/18 206 lb (93.4 kg)  04/07/18 205 lb 6 oz (93.2 kg)    Physical Exam  Constitutional: She is oriented to person, place, and time. She appears well-developed and well-nourished. No distress.  HENT:  Head: Atraumatic.  Right Ear: External ear normal.  Left Ear: External ear normal.  Mouth/Throat: Oropharynx is clear and moist. No oropharyngeal exudate.  Nasal mucosa erythematous  Eyes: Conjunctivae and EOM are normal.  Neck: Normal range of motion. Neck supple.  Cardiovascular: Normal rate, regular rhythm and normal heart sounds.  Pulmonary/Chest: Effort normal. No respiratory distress. She has wheezes (Minimal expiratory wheezes). She exhibits no tenderness.  Neurological: She is alert and oriented to person, place, and time.  Skin: Skin is warm and dry.  Psychiatric: She has a  normal mood and affect. Her behavior is normal.  Nursing note and vitals reviewed.   Results for orders placed or performed in visit on 06/02/18  Lipid Panel w/o Chol/HDL Ratio  Result Value Ref Range   Cholesterol, Total 176 100 - 199 mg/dL   Triglycerides 259 (H) 0 - 149 mg/dL   HDL 49 >56 mg/dL   VLDL Cholesterol Cal 49 (H) 5 - 40 mg/dL   LDL Calculated 78 0 - 99 mg/dL  Comprehensive metabolic panel  Result Value Ref Range   Glucose 167 (H) 65 - 99 mg/dL   BUN 20 8 - 27 mg/dL   Creatinine, Ser 3.87 (H) 0.57 - 1.00 mg/dL   GFR calc non Af Amer 26 (L) >59 mL/min/1.73   GFR calc Af Amer 30 (L) >59 mL/min/1.73   BUN/Creatinine Ratio 11 (L) 12 - 28   Sodium 140 134 - 144 mmol/L   Potassium 4.3 3.5 - 5.2 mmol/L   Chloride 102 96 - 106 mmol/L   CO2 25 20 - 29 mmol/L   Calcium 8.1 (L) 8.7 - 10.3 mg/dL   Total Protein 6.0 6.0 - 8.5 g/dL   Albumin 3.2 (L) 3.5 - 4.7 g/dL   Globulin, Total 2.8 1.5 - 4.5 g/dL   Albumin/Globulin Ratio 1.1 (L) 1.2 - 2.2   Bilirubin Total 0.3 0.0 - 1.2 mg/dL  Alkaline Phosphatase 58 39 - 117 IU/L   AST 18 0 - 40 IU/L   ALT 7 0 - 32 IU/L  HgB A1c  Result Value Ref Range   Hgb A1c MFr Bld 9.9 (H) 4.8 - 5.6 %   Est. average glucose Bld gHb Est-mCnc 237 mg/dL      Assessment & Plan:   Problem List Items Addressed This Visit    None    Visit Diagnoses    Wheezing    -  Primary   Suspect post-infectious inflammation. Symbicort sample given with instructions. Continue mucinex and tessalon prn. F/u if worsening sxs       Follow up plan: Return for as scheduled.

## 2018-06-29 NOTE — Telephone Encounter (Signed)
Pt called with complaints of wheezing; she says that she has been taking mucinex for a week and has been coughing up clear phlegm, but is having intermittent wheezing; recommendations, made per nurse triage protocol; the pt requested an afternoon appointment because she has to wait for her daughter to get off work; pt offered and accepted appointment with Lesly Dukes, on 06/29/18 at 1530; she would also like to know if something can be called in and she can cancel the appointment; will route to office for notification.       Reason for Disposition . [1] MILD difficulty breathing (e.g., minimal/no SOB at rest, SOB with walking, pulse <100) AND [2] NEW-onset or WORSE than normal  Answer Assessment - Initial Assessment Questions 1. RESPIRATORY STATUS: "Describe your breathing?" (e.g., wheezing, shortness of breath, unable to speak, severe coughing)      Intermittent wheezing 2. ONSET: "When did this breathing problem begin?"      06/22/18 3. PATTERN "Does the difficult breathing come and go, or has it been constant since it started?"      intermittent 4. SEVERITY: "How bad is your breathing?" (e.g., mild, moderate, severe)    - MILD: No SOB at rest, mild SOB with walking, speaks normally in sentences, can lay down, no retractions, pulse < 100.    - MODERATE: SOB at rest, SOB with minimal exertion and prefers to sit, cannot lie down flat, speaks in phrases, mild retractions, audible wheezing, pulse 100-120.    - SEVERE: Very SOB at rest, speaks in single words, struggling to breathe, sitting hunched forward, retractions, pulse > 120      mild 5. RECURRENT SYMPTOM: "Have you had difficulty breathing before?" If so, ask: "When was the last time?" and "What happened that time?"      no 6. CARDIAC HISTORY: "Do you have any history of heart disease?" (e.g., heart attack, angina, bypass surgery, angioplasty)      High blood pressurer 7. LUNG HISTORY: "Do you have any history of lung disease?"   (e.g., pulmonary embolus, asthma, emphysema)     no 8. CAUSE: "What do you think is causing the breathing problem?"      "stuck phlegm" 9. OTHER SYMPTOMS: "Do you have any other symptoms? (e.g., dizziness, runny nose, cough, chest pain, fever)     Runny nose, cough 10. PREGNANCY: "Is there any chance you are pregnant?" "When was your last menstrual period?"       no 11. TRAVEL: "Have you traveled out of the country in the last month?" (e.g., travel history, exposures)       no  Protocols used: BREATHING DIFFICULTY-A-AH

## 2018-06-29 NOTE — Patient Instructions (Signed)
Follow up as scheduled.  

## 2018-07-09 ENCOUNTER — Telehealth: Payer: Self-pay | Admitting: Family Medicine

## 2018-07-09 NOTE — Telephone Encounter (Signed)
She can go ahead and stop if she's much better  Copied from CRM [id number]. Topic: General - Other >> Jul 09, 2018 10:45 AM Percival Spanish wrote:  Pt call to say the   Symbicort  has helped and if she still need to continue to use it. She was given a sample in the office

## 2018-07-09 NOTE — Telephone Encounter (Signed)
Patient notified

## 2018-07-14 ENCOUNTER — Other Ambulatory Visit: Payer: Self-pay

## 2018-07-14 MED ORDER — MECLIZINE HCL 25 MG PO TABS: 25.0000 mg | ORAL_TABLET | Freq: Two times a day (BID) | ORAL | 0 refills | Status: AC | PRN

## 2018-07-14 MED ORDER — CARVEDILOL 12.5 MG PO TABS: 12.5000 mg | ORAL_TABLET | Freq: Two times a day (BID) | ORAL | 1 refills | Status: AC

## 2018-07-14 NOTE — Telephone Encounter (Signed)
Last regular f/up was 06/02/18 and has another appointment scheduled 10/02/2018.

## 2018-07-24 ENCOUNTER — Ambulatory Visit: Payer: Self-pay | Admitting: *Deleted

## 2018-07-24 NOTE — Telephone Encounter (Signed)
Pt calling with complaints of shortness of breath that she has been experiencing with movement for the past couple of days. Pt states she was having some wheezing about a week ago but reports that has stopped and now she is just having some shortness of brath at times and a runny nose. Pt denies any other symptoms at this time. Pt was seen for visit with Rachel,PA on 10/28 and was prescribed an inhaler which she states has been working but wanted to know if she could come into the office to  have a breathing treatment.Contacted Christan to see if there was appt availability for the pt to be seen in the office on today. No appts available today but pt  would be able to seen for an appt on on Monday with Dr. Laural BenesJohnson. Pt states she will come in for appt on Monday and pt also advised that if symptoms become worse before appt on Monday to seek care in the ED. Pt verbalized understanding.   Reason for Disposition . [1] MODERATE longstanding difficulty breathing (e.g., speaks in phrases, SOB even at rest, pulse 100-120) AND [2] SAME as normal  Answer Assessment - Initial Assessment Questions 1. RESPIRATORY STATUS: "Describe your breathing?" (e.g., wheezing, shortness of breath, unable to speak, severe coughing)      shortness 2. ONSET: "When did this breathing problem begin?"      Pt states she has been having SOB for the past couple of days 3. PATTERN "Does the difficult breathing come and go, or has it been constant since it started?"      Comes and goes, has shortness of breath with moving 4. SEVERITY: "How bad is your breathing?" (e.g., mild, moderate, severe)    - MILD: No SOB at rest, mild SOB with walking, speaks normally in sentences, can lay down, no retractions, pulse < 100.    - MODERATE: SOB at rest, SOB with minimal exertion and prefers to sit, cannot lie down flat, speaks in phrases, mild retractions, audible wheezing, pulse 100-120.    - SEVERE: Very SOB at rest, speaks in single words,  struggling to breathe, sitting hunched forward, retractions, pulse > 120      Mild states she becomes short of breath with movement, no complaints of shortness of breath with sitting 5. RECURRENT SYMPTOM: "Have you had difficulty breathing before?" If so, ask: "When was the last time?" and "What happened that time?"      Yes was previously seen for OV on 10/28 for wheezing 6. CARDIAC HISTORY: "Do you have any history of heart disease?" (e.g., heart attack, angina, bypass surgery, angioplasty)      pacemaker 7. LUNG HISTORY: "Do you have any history of lung disease?"  (e.g., pulmonary embolus, asthma, emphysema)     No 8. CAUSE: "What do you think is causing the breathing problem?"      Cold  9. OTHER SYMPTOMS: "Do you have any other symptoms? (e.g., dizziness, runny nose, cough, chest pain, fever)     Runny nose but no cough 10. PREGNANCY: "Is there any chance you are pregnant?" "When was your last menstrual period?"       n/a 11. TRAVEL: "Have you traveled out of the country in the last month?" (e.g., travel history, exposures)      no  Protocols used: BREATHING DIFFICULTY-A-AH

## 2018-07-27 ENCOUNTER — Ambulatory Visit: Payer: Medicare Other | Admitting: Family Medicine

## 2018-07-29 ENCOUNTER — Ambulatory Visit: Payer: Self-pay | Admitting: *Deleted

## 2018-07-29 NOTE — Telephone Encounter (Signed)
Spoke with Toniann FailWendy, the patients daughter, she declined earlier appointment with another provider on Friday.  Patient would like to see Fleet ContrasRachel.  Was advised to go the UC or ED if symptoms got worse.  Just FYI

## 2018-07-29 NOTE — Telephone Encounter (Signed)
If she can come in Friday, I think that would be best. Please have her go to UC or ER if worsening before then

## 2018-07-29 NOTE — Telephone Encounter (Signed)
Pt and her daughter, Toniann FailWendy, called stating that the pt has been wheezing for about a month; she says that she saw Roosvelt Maserachel Lane and was given a steroid inhaler for 2 weeks; Toniann FailWendy says that they were directed to urgent care on 07/24/18 and was put back on lasix; Toniann FailWendy says that the pt is needing help getting dressed because of her wheezing; Toniann FailWendy says that "the wheezing is not getting worse, but not getting better";  recommendations made per nurse triage protocol; the pt would like to be seen in the office today but Roosvelt Maserachel Lane has no availability; pt offered and accepted appointment with Roosvelt Maserachel Lane, [facility], 08/03/18 at 1600; she verbalized understanding; will route to office for notification of this encounter.  Reason for Disposition . [1] MILD longstanding difficulty breathing AND [2]  SAME as normal  Answer Assessment - Initial Assessment Questions 1. RESPIRATORY STATUS: "Describe your breathing?" (e.g., wheezing, shortness of breath, unable to speak, severe coughing)      wheezing 2. ONSET: "When did this breathing problem begin?"      Seen in office 06/29/18 and urgent care 07/24/09 3. PATTERN "Does the difficult breathing come and go, or has it been constant since it started?"      Comes and goes 4. SEVERITY: "How bad is your breathing?" (e.g., mild, moderate, severe)    - MILD: No SOB at rest, mild SOB with walking, speaks normally in sentences, can lay down, no retractions, pulse < 100.    - MODERATE: SOB at rest, SOB with minimal exertion and prefers to sit, cannot lie down flat, speaks in phrases, mild retractions, audible wheezing, pulse 100-120.    - SEVERE: Very SOB at rest, speaks in single words, struggling to breathe, sitting hunched forward, retractions, pulse > 120      mild 5. RECURRENT SYMPTOM: "Have you had difficulty breathing before?" If so, ask: "When was the last time?" and "What happened that time?"    ongoing since office visit 06/29/18 6. CARDIAC HISTORY:  "Do you have any history of heart disease?" (e.g., heart attack, angina, bypass surgery, angioplasty)      Pacemaker, high blood pressure, diabetes, heart murmer 7. LUNG HISTORY: "Do you have any history of lung disease?"  (e.g., pulmonary embolus, asthma, emphysema)     Yes pro-air inhaler 8. CAUSE: "What do you think is causing the breathing problem?"      not 9. OTHER SYMPTOMS: "Do you have any other symptoms? (e.g., dizziness, runny nose, cough, chest pain, fever)     Runny nose and eyes, cough 10. PREGNANCY: "Is there any chance you are pregnant?" "When was your last menstrual period?"       no 11. TRAVEL: "Have you traveled out of the country in the last month?" (e.g., travel history, exposures)       no  Protocols used: BREATHING DIFFICULTY-A-AH

## 2018-08-03 ENCOUNTER — Ambulatory Visit: Payer: Self-pay | Admitting: Family Medicine

## 2018-08-06 ENCOUNTER — Other Ambulatory Visit: Payer: Self-pay | Admitting: Family Medicine

## 2018-08-06 NOTE — Telephone Encounter (Signed)
Requested medication (s) are due for refill today -no  Requested medication (s) are on the active medication list -no  Future visit scheduled -yes  Last refill: 06/02/18  Notes to clinic: Patient was given Rx at acute appointment- due to medical history- sent for provider review for refill  Requested Prescriptions  Pending Prescriptions Disp Refills   benzonatate (TESSALON) 200 MG capsule [Pharmacy Med Name: BENZONATATE 200MG  CAPSULES] 40 capsule 0    Sig: TAKE 1 CAPSULE(200 MG) BY MOUTH THREE TIMES DAILY AS NEEDED     Ear, Nose, and Throat:  Antitussives/Expectorants Passed - 08/06/2018  4:21 PM      Passed - Valid encounter within last 12 months    Recent Outpatient Visits          1 month ago Wheezing   [facility] Particia NearingLane, Rachel [place], [place]   2 months ago Essential hypertension   [facility] Roosvelt MaserLane, Rachel [place], [place]   4 months ago Chronic diastolic heart failure [facility](HCC)   [facility] [place], Adriana M, PA-C   7 months ago Irregular heart rate   [facility] [name], [name], NP   7 months ago Controlled type 2 diabetes mellitus with stage 4 chronic kidney disease, without long-term current use of insulin (HCC)   [facility] [place]ull[place]ane, Salley Hewsachel Elizabeth, [place]      Future Appointments            In 1 month Maurice MarchLane, Salley Hewsachel Elizabeth, PA-C [facility]            Requested Prescriptions  Pending Prescriptions Disp Refills   benzonatate (TESSALON) 200 MG capsule [Pharmacy Med Name: BENZONATATE 200MG  CAPSULES] 40 capsule 0    Sig: TAKE 1 CAPSULE(200 MG) BY MOUTH THREE TIMES DAILY AS NEEDED     Ear, Nose, and Throat:  Antitussives/Expectorants Passed - 08/06/2018  4:21 PM      Passed - Valid encounter within last 12 months    Recent Outpatient Visits          1 month ago Wheezing   [facility] Particia NearingLane, Rachel Elizabeth, [place]   2 months ago Essential hypertension   [facility] Roosvelt MaserLane, Rachel [place], [place]   4 months ago Chronic diastolic heart failure [facility](HCC)   [facility] [place], Adriana M, PA-C   7 months ago Irregular heart rate   [facility] [name], [name], NP   7 months ago Controlled type 2 diabetes mellitus with stage 4 chronic kidney disease, without long-term current use of insulin [facility](HCC)   [facility] GeraldLane, Salley Hewsachel Elizabeth, [place]      Future Appointments            In 1 month Maurice MarchLane, Salley Hewsachel Elizabeth, PA-C [facility]

## 2018-08-10 ENCOUNTER — Emergency Department: Payer: Medicare Other

## 2018-08-10 ENCOUNTER — Other Ambulatory Visit: Payer: Self-pay

## 2018-08-10 ENCOUNTER — Inpatient Hospital Stay
Admission: EM | Admit: 2018-08-10 | Discharge: 2018-08-18 | DRG: 291 | Disposition: A | Discharge status: Home Health | Payer: Medicare Other | Attending: Internal Medicine | Admitting: Internal Medicine

## 2018-08-10 DIAGNOSIS — J441 Chronic obstructive pulmonary disease with (acute) exacerbation: Secondary | ICD-10-CM | POA: Diagnosis present

## 2018-08-10 DIAGNOSIS — I5033 Acute on chronic diastolic (congestive) heart failure: Secondary | ICD-10-CM | POA: Diagnosis present

## 2018-08-10 DIAGNOSIS — T502X5A Adverse effect of carbonic-anhydrase inhibitors, benzothiadiazides and other diuretics, initial encounter: Secondary | ICD-10-CM | POA: Diagnosis present

## 2018-08-10 DIAGNOSIS — Z950 Presence of cardiac pacemaker: Secondary | ICD-10-CM

## 2018-08-10 DIAGNOSIS — E1122 Type 2 diabetes mellitus with diabetic chronic kidney disease: Secondary | ICD-10-CM | POA: Diagnosis present

## 2018-08-10 DIAGNOSIS — Z7984 Long term (current) use of oral hypoglycemic drugs: Secondary | ICD-10-CM | POA: Diagnosis not present

## 2018-08-10 DIAGNOSIS — I350 Nonrheumatic aortic (valve) stenosis: Secondary | ICD-10-CM | POA: Diagnosis present

## 2018-08-10 DIAGNOSIS — Z6837 Body mass index (BMI) 37.0-37.9, adult: Secondary | ICD-10-CM

## 2018-08-10 DIAGNOSIS — I130 Hypertensive heart and chronic kidney disease with heart failure and stage 1 through stage 4 chronic kidney disease, or unspecified chronic kidney disease: Secondary | ICD-10-CM | POA: Diagnosis present

## 2018-08-10 DIAGNOSIS — R531 Weakness: Secondary | ICD-10-CM

## 2018-08-10 DIAGNOSIS — E7800 Pure hypercholesterolemia, unspecified: Secondary | ICD-10-CM | POA: Diagnosis present

## 2018-08-10 DIAGNOSIS — Z8249 Family history of ischemic heart disease and other diseases of the circulatory system: Secondary | ICD-10-CM | POA: Diagnosis not present

## 2018-08-10 DIAGNOSIS — N184 Chronic kidney disease, stage 4 (severe): Secondary | ICD-10-CM | POA: Diagnosis present

## 2018-08-10 DIAGNOSIS — E871 Hypo-osmolality and hyponatremia: Secondary | ICD-10-CM | POA: Diagnosis present

## 2018-08-10 DIAGNOSIS — K219 Gastro-esophageal reflux disease without esophagitis: Secondary | ICD-10-CM | POA: Diagnosis present

## 2018-08-10 DIAGNOSIS — Z90710 Acquired absence of both cervix and uterus: Secondary | ICD-10-CM

## 2018-08-10 DIAGNOSIS — E1121 Type 2 diabetes mellitus with diabetic nephropathy: Secondary | ICD-10-CM | POA: Diagnosis present

## 2018-08-10 DIAGNOSIS — N179 Acute kidney failure, unspecified: Secondary | ICD-10-CM | POA: Diagnosis present

## 2018-08-10 DIAGNOSIS — D631 Anemia in chronic kidney disease: Secondary | ICD-10-CM | POA: Diagnosis present

## 2018-08-10 DIAGNOSIS — J9621 Acute and chronic respiratory failure with hypoxia: Secondary | ICD-10-CM | POA: Diagnosis present

## 2018-08-10 DIAGNOSIS — E875 Hyperkalemia: Secondary | ICD-10-CM | POA: Diagnosis present

## 2018-08-10 DIAGNOSIS — D649 Anemia, unspecified: Secondary | ICD-10-CM

## 2018-08-10 DIAGNOSIS — Z79899 Other long term (current) drug therapy: Secondary | ICD-10-CM | POA: Diagnosis not present

## 2018-08-10 DIAGNOSIS — J9601 Acute respiratory failure with hypoxia: Secondary | ICD-10-CM

## 2018-08-10 DIAGNOSIS — E785 Hyperlipidemia, unspecified: Secondary | ICD-10-CM | POA: Diagnosis present

## 2018-08-10 DIAGNOSIS — J9690 Respiratory failure, unspecified, unspecified whether with hypoxia or hypercapnia: Secondary | ICD-10-CM | POA: Diagnosis present

## 2018-08-10 LAB — RETICULOCYTES
Immature Retic Fract: 24.2 % — ABNORMAL HIGH (ref 2.3–15.9)
RBC.: 2.11 MIL/uL — ABNORMAL LOW (ref 3.87–5.11)
Retic Count, Absolute: 71.5 10*3/uL (ref 19.0–186.0)
Retic Ct Pct: 3.4 % — ABNORMAL HIGH (ref 0.4–3.1)

## 2018-08-10 LAB — BASIC METABOLIC PANEL
ANION GAP: 7 (ref 5–15)
BUN: 59 mg/dL — ABNORMAL HIGH (ref 8–23)
CALCIUM: 7.7 mg/dL — AB (ref 8.9–10.3)
CO2: 29 mmol/L (ref 22–32)
CREATININE: 3.49 mg/dL — AB (ref 0.44–1.00)
Chloride: 101 mmol/L (ref 98–111)
GFR, EST AFRICAN AMERICAN: 13 mL/min — AB (ref >60)
GFR, EST NON AFRICAN AMERICAN: 11 mL/min — AB (ref >60)
Glucose, Bld: 52 mg/dL — ABNORMAL LOW (ref 70–99)
Potassium: 4.4 mmol/L (ref 3.5–5.1)
Sodium: 137 mmol/L (ref 135–145)

## 2018-08-10 LAB — BLOOD GAS, ARTERIAL
Acid-Base Excess: 0.9 mmol/L (ref 0.0–2.0)
Bicarbonate: 28.9 mmol/L — ABNORMAL HIGH (ref 20.0–28.0)
FIO2: 0.28
O2 Saturation: 84.9 %
Patient temperature: 37
pCO2 arterial: 60 mmHg — ABNORMAL HIGH (ref 32.0–48.0)
pH, Arterial: 7.29 — ABNORMAL LOW (ref 7.350–7.450)
pO2, Arterial: 56 mmHg — ABNORMAL LOW (ref 83.0–108.0)

## 2018-08-10 LAB — TSH: TSH: 4.543 u[IU]/mL — ABNORMAL HIGH (ref 0.350–4.500)

## 2018-08-10 LAB — ABO/RH: ABO/RH(D): B POS

## 2018-08-10 LAB — GLUCOSE, CAPILLARY
GLUCOSE-CAPILLARY: 50 mg/dL — AB (ref 70–99)
GLUCOSE-CAPILLARY: 113 mg/dL — AB (ref 70–99)
Glucose-Capillary: 45 mg/dL — ABNORMAL LOW (ref 70–99)
Glucose-Capillary: 83 mg/dL (ref 70–99)

## 2018-08-10 LAB — TROPONIN I
TROPONIN I: 0.03 ng/mL — AB (ref <0.03)
Troponin I: <0.03 ng/mL (ref <0.03)

## 2018-08-10 LAB — IRON AND TIBC
IRON: 140 ug/dL (ref 28–170)
Saturation Ratios: 51 % — ABNORMAL HIGH (ref 10.4–31.8)
TIBC: 276 ug/dL (ref 250–450)
UIBC: 136 ug/dL

## 2018-08-10 LAB — FERRITIN: Ferritin: 143 ng/mL (ref 11–307)

## 2018-08-10 LAB — BRAIN NATRIURETIC PEPTIDE: B Natriuretic Peptide: 1632 pg/mL — ABNORMAL HIGH (ref 0.0–100.0)

## 2018-08-10 LAB — CBC
HCT: 26.4 % — ABNORMAL LOW (ref 36.0–46.0)
Hemoglobin: 8.5 g/dL — ABNORMAL LOW (ref 12.0–15.0)
MCH: 39 pg — ABNORMAL HIGH (ref 26.0–34.0)
MCHC: 32.2 g/dL (ref 30.0–36.0)
MCV: 121.1 fL — AB (ref 80.0–100.0)
NRBC: 2.4 % — AB (ref 0.0–0.2)
PLATELETS: 218 10*3/uL (ref 150–400)
RBC: 2.18 MIL/uL — AB (ref 3.87–5.11)
WBC: 3.7 10*3/uL — AB (ref 4.0–10.5)

## 2018-08-10 LAB — FOLATE: Folate: 16.4 ng/mL (ref >5.9)

## 2018-08-10 MED ORDER — IPRATROPIUM-ALBUTEROL 0.5-2.5 (3) MG/3ML IN SOLN
3.0000 mL | Freq: Four times a day (QID) | RESPIRATORY_TRACT | Status: DC
  Administered 2018-08-10 – 2018-08-15 (×21): 3 mL via RESPIRATORY_TRACT
  Filled 2018-08-10 (×21): qty 3

## 2018-08-10 MED ORDER — CLONIDINE HCL 0.3 MG/24HR TD PTWK: 0.3000 mg | MEDICATED_PATCH | TRANSDERMAL | Status: DC

## 2018-08-10 MED ORDER — INSULIN ASPART 100 UNIT/ML ~~LOC~~ SOLN
0.0000 [IU] | Freq: Three times a day (TID) | SUBCUTANEOUS | Status: DC
  Administered 2018-08-11: 11 [IU] via SUBCUTANEOUS
  Administered 2018-08-11: 20 [IU] via SUBCUTANEOUS
  Administered 2018-08-11 – 2018-08-12 (×3): 4 [IU] via SUBCUTANEOUS
  Administered 2018-08-12: 3 [IU] via SUBCUTANEOUS
  Administered 2018-08-13 – 2018-08-15 (×4): 4 [IU] via SUBCUTANEOUS
  Administered 2018-08-15: 3 [IU] via SUBCUTANEOUS
  Administered 2018-08-17 (×2): 4 [IU] via SUBCUTANEOUS
  Administered 2018-08-17: 7 [IU] via SUBCUTANEOUS
  Filled 2018-08-10 (×14): qty 1

## 2018-08-10 MED ORDER — ACETAMINOPHEN 325 MG PO TABS
650.0000 mg | ORAL_TABLET | ORAL | Status: DC | PRN
  Administered 2018-08-13 – 2018-08-15 (×2): 650 mg via ORAL
  Filled 2018-08-10 (×2): qty 2

## 2018-08-10 MED ORDER — PANTOPRAZOLE SODIUM 40 MG PO TBEC
40.0000 mg | DELAYED_RELEASE_TABLET | Freq: Every day | ORAL | Status: DC
  Administered 2018-08-11 – 2018-08-18 (×8): 40 mg via ORAL
  Filled 2018-08-10 (×9): qty 1

## 2018-08-10 MED ORDER — CALCIUM CARBONATE ANTACID 500 MG PO CHEW
1.0000 | CHEWABLE_TABLET | Freq: Once | ORAL | Status: AC
  Administered 2018-08-10: 200 mg via ORAL
  Filled 2018-08-10: qty 1

## 2018-08-10 MED ORDER — GUAIFENESIN ER 600 MG PO TB12
600.0000 mg | ORAL_TABLET | Freq: Two times a day (BID) | ORAL | Status: DC | PRN
  Administered 2018-08-11 – 2018-08-18 (×9): 600 mg via ORAL
  Filled 2018-08-10 (×9): qty 1

## 2018-08-10 MED ORDER — BUDESONIDE 0.5 MG/2ML IN SUSP
0.5000 mg | Freq: Two times a day (BID) | RESPIRATORY_TRACT | Status: DC
  Administered 2018-08-10 – 2018-08-18 (×16): 0.5 mg via RESPIRATORY_TRACT
  Filled 2018-08-10 (×16): qty 2

## 2018-08-10 MED ORDER — HYDRALAZINE HCL 20 MG/ML IJ SOLN: 10.0000 mg | INTRAMUSCULAR | Status: DC | PRN

## 2018-08-10 MED ORDER — ONDANSETRON 4 MG PO TBDP
4.0000 mg | ORAL_TABLET | Freq: Three times a day (TID) | ORAL | Status: DC | PRN
  Filled 2018-08-10: qty 1

## 2018-08-10 MED ORDER — CARVEDILOL 12.5 MG PO TABS
12.5000 mg | ORAL_TABLET | Freq: Two times a day (BID) | ORAL | Status: DC
  Administered 2018-08-10 – 2018-08-12 (×5): 12.5 mg via ORAL
  Filled 2018-08-10 (×8): qty 1

## 2018-08-10 MED ORDER — GLIPIZIDE 5 MG PO TABS: 5.0000 mg | ORAL_TABLET | Freq: Two times a day (BID) | ORAL | Status: DC

## 2018-08-10 MED ORDER — ROSUVASTATIN CALCIUM 5 MG PO TABS
5.0000 mg | ORAL_TABLET | ORAL | Status: DC
  Administered 2018-08-11 – 2018-08-18 (×5): 5 mg via ORAL
  Filled 2018-08-10 (×8): qty 1

## 2018-08-10 MED ORDER — SODIUM CHLORIDE 0.9% FLUSH
3.0000 mL | INTRAVENOUS | Status: DC | PRN
  Administered 2018-08-12: 3 mL via INTRAVENOUS
  Filled 2018-08-10: qty 3

## 2018-08-10 MED ORDER — FUROSEMIDE 10 MG/ML IJ SOLN
60.0000 mg | Freq: Once | INTRAMUSCULAR | Status: AC
  Administered 2018-08-10: 60 mg via INTRAVENOUS
  Filled 2018-08-10: qty 8

## 2018-08-10 MED ORDER — IPRATROPIUM-ALBUTEROL 0.5-2.5 (3) MG/3ML IN SOLN
3.0000 mL | Freq: Once | RESPIRATORY_TRACT | Status: AC
  Administered 2018-08-10: 3 mL via RESPIRATORY_TRACT
  Filled 2018-08-10: qty 3

## 2018-08-10 MED ORDER — HEPARIN SODIUM (PORCINE) 5000 UNIT/ML IJ SOLN
5000.0000 [IU] | Freq: Three times a day (TID) | INTRAMUSCULAR | Status: DC
  Administered 2018-08-10 – 2018-08-18 (×22): 5000 [IU] via SUBCUTANEOUS
  Filled 2018-08-10 (×22): qty 1

## 2018-08-10 MED ORDER — ONDANSETRON HCL 4 MG/2ML IJ SOLN: 4.0000 mg | Freq: Four times a day (QID) | INTRAMUSCULAR | Status: DC | PRN

## 2018-08-10 MED ORDER — INSULIN ASPART 100 UNIT/ML ~~LOC~~ SOLN
0.0000 [IU] | Freq: Every day | SUBCUTANEOUS | Status: DC
  Administered 2018-08-12: 2 [IU] via SUBCUTANEOUS
  Administered 2018-08-17: 3 [IU] via SUBCUTANEOUS
  Filled 2018-08-10 (×2): qty 1

## 2018-08-10 MED ORDER — FUROSEMIDE 10 MG/ML IJ SOLN
20.0000 mg | Freq: Two times a day (BID) | INTRAMUSCULAR | Status: DC
  Administered 2018-08-10 – 2018-08-13 (×6): 20 mg via INTRAVENOUS
  Filled 2018-08-10 (×7): qty 2

## 2018-08-10 MED ORDER — SODIUM CHLORIDE 0.9 % IV SOLN
Freq: Once | INTRAVENOUS | Status: AC
  Administered 2018-08-10: 15:00:00 via INTRAVENOUS

## 2018-08-10 MED ORDER — METHYLPREDNISOLONE SODIUM SUCC 125 MG IJ SOLR
60.0000 mg | Freq: Three times a day (TID) | INTRAMUSCULAR | Status: DC
  Administered 2018-08-10 – 2018-08-13 (×9): 60 mg via INTRAVENOUS
  Filled 2018-08-10 (×10): qty 2

## 2018-08-10 MED ORDER — BENZONATATE 100 MG PO CAPS
200.0000 mg | ORAL_CAPSULE | Freq: Three times a day (TID) | ORAL | Status: DC | PRN
  Administered 2018-08-11 – 2018-08-18 (×13): 200 mg via ORAL
  Filled 2018-08-10 (×15): qty 2

## 2018-08-10 MED ORDER — SODIUM CHLORIDE 0.9% FLUSH
3.0000 mL | Freq: Two times a day (BID) | INTRAVENOUS | Status: DC
  Administered 2018-08-10 – 2018-08-18 (×11): 3 mL via INTRAVENOUS

## 2018-08-10 MED ORDER — SODIUM CHLORIDE 0.9 % IV SOLN: 250.0000 mL | INTRAVENOUS | Status: DC | PRN

## 2018-08-10 NOTE — ED Notes (Signed)
Patient given sandwich tray. 

## 2018-08-10 NOTE — ED Notes (Signed)
Patient taken peanut butter and crackers

## 2018-08-10 NOTE — ED Notes (Signed)
Lab called for type and screen redraw. Per lab short staffed but coming ASAP.

## 2018-08-10 NOTE — Progress Notes (Signed)
Verbal order from salary that patient does not need transfusion at this time.  Patient has been type and screened incase this is necessary at later time.

## 2018-08-10 NOTE — Progress Notes (Deleted)
Patient ID: Patty Roberts, female    DOB: 12/10/1931, 84 y.o.   MRN: [id number]  HPI  Patty Roberts is a 84 y/o female with a history of DM, hyperlipidemia, HTN, CKD, hyponatremia, GERD and chronic heart failure.   Echo report from 12/21/17 reviewed and showed an EF of 60-65% along with mild AS with normal PA pressure.  Was in the ED 03/23/18 due to weakness and shortness of breath where she was evaluated and released. Lasix was increased for a couple of days. Was in the ED 03/13/18 due to fatigue and elevated glucose. Labs indicated dehydration. IV fluids given and she was released.   She presents today for a follow-up visit with a chief complaint of   Past Medical History:  Diagnosis Date  . CHF (congestive heart failure) (HCC)   . Chronic kidney disease   . Diabetes mellitus without complication (HCC)   . GERD (gastroesophageal reflux disease)   . Hyperlipidemia   . Hypertension   . Hyponatremia    Past Surgical History:  Procedure Laterality Date  . ABDOMINAL HYSTERECTOMY    . ANKLE FRACTURE SURGERY    . HERNIA REPAIR    . PACEMAKER INSERTION     x2  . vertebral fix     Family History  Problem Relation Age of Onset  . Hypertension Mother   . Thyroid disease Daughter   . Heart disease Son        heart attack   Social History   Tobacco Use  . Smoking status: Never Smoker  . Smokeless tobacco: Never Used  Substance Use Topics  . Alcohol use: No    Alcohol/week: 0.0 standard drinks   Allergies  Allergen Reactions  . Phenameth [Promethazine]   . Statins Other (See Comments)    Leg pain     Review of Systems  Constitutional: Negative for appetite change and fatigue.  HENT: Positive for rhinorrhea. Negative for congestion and sore throat.   Eyes: Negative.   Respiratory: Negative for cough, chest tightness and shortness of breath.   Cardiovascular: Negative for chest pain, palpitations and leg swelling.  Gastrointestinal: Negative for abdominal distention and abdominal  pain.  Endocrine: Negative.   Genitourinary: Negative.   Musculoskeletal: Negative for back pain and neck pain.  Skin: Negative.   Allergic/Immunologic: Negative.   Neurological: Negative for dizziness and light-headedness.  Hematological: Negative for adenopathy. Bruises/bleeds easily.  Psychiatric/Behavioral: Negative for dysphoric mood and sleep disturbance. The patient is not nervous/anxious.      Physical Exam  Constitutional: She is oriented to person, place, and time. She appears well-developed and well-nourished.  HENT:  Head: Normocephalic and atraumatic.  Neck: Normal range of motion. Neck supple. No JVD present.  Cardiovascular: Normal rate and regular rhythm.  Pulmonary/Chest: Effort normal. No respiratory distress. She has no wheezes. She has no rales.  Abdominal: Soft. She exhibits no distension.  Musculoskeletal: She exhibits no edema or deformity.  Neurological: She is alert and oriented to person, place, and time.  Skin: Skin is warm and dry.  Psychiatric: She has a normal mood and affect. Her behavior is normal.  Nursing note and vitals reviewed.  Assessment & Plan:  1: Chronic heart failure with preserved ejection fraction- - NYHA class I - euvolemic today - weighing daily and says that her weight has risen slightly. Reminded to call for an overnight weight gain of >2 pounds or a weekly weight gain of >5 pounds - weight  - not adding salt but does  eat out often. Reviewed the importance of closely following a 2000mg  sodium diet. - has appointment with cardiology Mariah Milling([place]) 05/11/18 - BNP 12/23/17 was 300.0  2: HTN- - BP  - saw PCP - BMP from 06/02/18 reviewed and showed sodium 140, potassium 4.3, creatinine 1.77 and GFR 30   3: DM- - fasting glucose yesterday morning was  - saw nephrology Thedore Mins([place]) recently - A1c 01/05/18 was 6.8%  Medication list was reviewed with patient.

## 2018-08-10 NOTE — ED Provider Notes (Addendum)
[facility] Emergency Department Provider Note    First MD Initiated Contact with Patient 08/10/18 1355     (approximate)  I have reviewed the triage vital signs and the nursing notes.   HISTORY  Chief Complaint Shortness of Breath    HPI Patty Roberts is a 84 y.o. female with a history of CHF CKD on Lasix at home as well as a history of bronchitis recently treated with nebulizers and steroids presents to the ER with 3 days of worsening shortness of breath particularly exertional dyspnea weakness and feeling that she is wheezing.  Does not wear home oxygen.  She denies any chest pain.  No fevers.  No cough.    Past Medical History:  Diagnosis Date  . CHF (congestive heart failure) (HCC)   . Chronic kidney disease   . Diabetes mellitus without complication (HCC)   . GERD (gastroesophageal reflux disease)   . Hyperlipidemia   . Hypertension   . Hyponatremia    Family History  Problem Relation Age of Onset  . Hypertension Mother   . Thyroid disease Daughter   . Heart disease Son        heart attack   Past Surgical History:  Procedure Laterality Date  . ABDOMINAL HYSTERECTOMY    . ANKLE FRACTURE SURGERY    . HERNIA REPAIR    . PACEMAKER INSERTION     x2  . vertebral fix     Patient Active Problem List   Diagnosis Date Noted  . Chronic diastolic heart failure (HCC) 01/02/2018  . HTN (hypertension) 01/02/2018  . Advanced care planning/counseling discussion 04/04/2017  . Anxiety 12/27/2016  . Ceruminosis, bilateral 12/27/2016  . Low back pain 04/12/2015  . Hyperlipidemia 04/11/2015  . Controlled type 2 diabetes mellitus with chronic kidney disease (HCC) 04/11/2015  . GERD (gastroesophageal reflux disease) 04/11/2015  . CKD (chronic kidney disease) stage 4, GFR 15-29 ml/min (HCC) 04/11/2015  . Heart murmur, systolic 04/11/2015      Prior to Admission medications   Medication Sig Start Date End Date Taking? Authorizing Provider    amLODipine (NORVASC) 5 MG tablet Take 5 mg by mouth daily.    [provider]  benzonatate (TESSALON) 200 MG capsule TAKE 1 CAPSULE(200 MG) BY MOUTH THREE TIMES DAILY AS NEEDED 08/07/18   Particia NearingLane, Rachel Elizabeth, PA-C  carvedilol (COREG) 12.5 MG tablet Take 1 tablet (12.5 mg total) by mouth 2 (two) times daily with a meal. Instead of Bystolic 07/14/18   Particia NearingLane, Rachel Elizabeth, PA-C  cloNIDine (CATAPRES - DOSED IN MG/24 HR) 0.3 mg/24hr patch APPLY 1 PATCH TO SKIN ONCE A WEEK 10/20/17   [name], [name], NP  furosemide (LASIX) 40 MG tablet TAKE 1 TABLET BY MOUTH BY MOUTH TWICE A DAY TAKE WITH BREAKFAST AND LUNCH 04/21/18   [provider]  glipiZIDE (GLUCOTROL) 5 MG tablet Take 1 tablet (5 mg total) by mouth 2 (two) times daily before a meal. 06/03/18   Particia NearingLane, Rachel Elizabeth, PA-C  guaiFENesin (MUCINEX) 600 MG 12 hr tablet Take 1 tablet (600 mg total) by mouth 2 (two) times daily as needed. 05/25/18   Particia NearingLane, Rachel Elizabeth, PA-C  meclizine (ANTIVERT) 25 MG tablet Take 1 tablet (25 mg total) by mouth 2 (two) times daily as needed for dizziness. 07/14/18   Particia NearingLane, Rachel Elizabeth, PA-C  omeprazole (PRILOSEC) 20 MG capsule Take 1 capsule (20 mg total) by mouth every other day. 10/20/17   [name], [name], NP  ondansetron (ZOFRAN ODT) 4 MG  disintegrating tablet Take 1 tablet (4 mg total) by mouth every 8 (eight) hours as needed for nausea or vomiting. 03/13/18   Jene Every, MD  PROAIR HFA 108 [phone] Base) MCG/ACT inhaler INHALE 2 PUFFS BY MOUTH EVERY 6 HOURS IF NEEDED FOR SHORTNESS OF BREATH OR WHEEZING 05/21/18   Particia Nearing, PA-C  rosuvastatin (CRESTOR) 5 MG tablet TAKE 1 TABLET DAILY 06/02/18   Particia Nearing, PA-C    Allergies Phenameth [promethazine] and Statins    Social History Social History   Tobacco Use  . Smoking status: Never Smoker  . Smokeless tobacco: Never Used  Substance Use Topics  . Alcohol use: No    Alcohol/week: 0.0 standard drinks  . Drug use:  No    Review of Systems Patient denies headaches, rhinorrhea, blurry vision, numbness, shortness of breath, chest pain, edema, cough, abdominal pain, nausea, vomiting, diarrhea, dysuria, fevers, rashes or hallucinations unless otherwise stated above in HPI. ____________________________________________   PHYSICAL EXAM:  VITAL SIGNS: Vitals:   08/10/18 1328 08/10/18 1331  BP: 130/68 130/68  Pulse: 69 64  Resp: (!) 26   Temp: 97.8 F (36.6 C)   SpO2: 98% 98%    Constitutional: Alert and oriented.  Eyes: Conjunctivae are normal.  Head: Atraumatic. Nose: No congestion/rhinnorhea. Mouth/Throat: Mucous membranes are moist.   Neck: No stridor. Painless ROM.  Cardiovascular: Normal rate, regular rhythm. Grossly normal heart sounds.  Good peripheral circulation. Respiratory: mild tachypnea with diminished breathsounds bilaterally Gastrointestinal: Soft and nontender. No distention. No abdominal bruits. No CVA tenderness. Genitourinary:  Musculoskeletal: No lower extremity tenderness,  2+ BLE edema.  No joint effusions. Neurologic:  Normal speech and language. No gross focal neurologic deficits are appreciated. No facial droop Skin:  Skin is warm, dry and intact. No rash noted. Psychiatric: Mood and affect are normal. Speech and behavior are normal.  ____________________________________________   LABS (all labs ordered are listed, but only abnormal results are displayed)  Results for orders placed or performed during the hospital encounter of 08/10/18 (from the past 24 hour(s))  Basic metabolic panel     Status: Abnormal   Collection Time: 08/10/18  1:48 PM  Result Value Ref Range   Sodium 137 135 - 145 mmol/L   Potassium 4.4 3.5 - 5.1 mmol/L   Chloride 101 98 - 111 mmol/L   CO2 29 22 - 32 mmol/L   Glucose, Bld 52 (L) 70 - 99 mg/dL   BUN 59 (H) 8 - 23 mg/dL   Creatinine, Ser 1.09 (H) 0.44 - 1.00 mg/dL   Calcium 7.7 (L) 8.9 - 10.3 mg/dL   GFR calc non Af Amer 11 (L) >60  mL/min   GFR calc Af Amer 13 (L) >60 mL/min   Anion gap 7 5 - 15  CBC     Status: Abnormal   Collection Time: 08/10/18  1:48 PM  Result Value Ref Range   WBC 3.7 (L) 4.0 - 10.5 K/uL   RBC 2.18 (L) 3.87 - 5.11 MIL/uL   Hemoglobin 8.5 (L) 12.0 - 15.0 g/dL   HCT 60.4 (L) 54.0 - 98.1 %   MCV 121.1 (H) 80.0 - 100.0 fL   MCH 39.0 (H) 26.0 - 34.0 pg   MCHC 32.2 30.0 - 36.0 g/dL   RDW Not Measured 19.1 - 15.5 %   Platelets 218 150 - 400 K/uL   nRBC 2.4 (H) 0.0 - 0.2 %  Troponin I - ONCE - STAT     Status: Abnormal   Collection  Time: 08/10/18  1:48 PM  Result Value Ref Range   Troponin I 0.03 (HH) <0.03 ng/mL  Prepare RBC     Status: None (Preliminary result)   Collection Time: 08/10/18  3:05 PM  Result Value Ref Range   Order Confirmation      PLEASE ORDER TYPE AND SCREEN Performed at [facility], [address], [place], [place] [address]    ____________________________________________  EKG My review and personal interpretation at Time: 13:30   Indication: sob  Rate: 70  Rhythm: v paced Axis: left Other: paced rhythm, abn ekg ____________________________________________  RADIOLOGY  I personally reviewed all radiographic images ordered to evaluate for the above acute complaints and reviewed radiology reports and findings.  These findings were personally discussed with the patient.  Please see medical record for radiology report.  ____________________________________________   PROCEDURES  Procedure(s) performed:  .Critical Care Performed by: Willy Eddy, MD Authorized by: Willy Eddy, MD   Critical care provider statement:    Critical care time (minutes):  30   Critical care was necessary to treat or prevent imminent or life-threatening deterioration of the following conditions:  Respiratory failure   Critical care was time spent personally by me on the following activities:  Discussions with consultants, evaluation of patient's response to  treatment, examination of patient, ordering and performing treatments and interventions, ordering and review of laboratory studies, ordering and review of radiographic studies, pulse oximetry, re-evaluation of patient's condition, obtaining history from patient or surrogate and review of old charts      Critical Care performed: yes ____________________________________________   INITIAL IMPRESSION / ASSESSMENT AND PLAN / ED COURSE  Pertinent labs & imaging results that were available during my care of the patient were reviewed by me and considered in my medical decision making (see chart for details).   DDX: Asthma, copd, CHF, pna, ptx, malignancy, Pe, anemia   Chanin W Owensby is a 84 y.o. who presents to the ED with shortness of breath and exertional dyspnea as described above.  She is afebrile but does have some shortness of breath.  Diminished breath sounds and bibasilar lung fields.  Possible underlying component of COPD. The patient will be placed on continuous pulse oximetry and telemetry for monitoring.  Laboratory evaluation will be sent to evaluate for the above complaints.     Clinical Course as of Aug 10 1540  Mon Aug 10, 2018  1422 Patient was stopped on her oxygen and observed.  Even at rest she desaturated down to 81% with a good waveform.  Supplemental oxygen was restarted with improvement after about a minute or 2 of deep breathing through her nose.   [PR]  1511 Rectal exam does not show any evidence of melena guaiac negative.  Does have evidence of acute anemia previous. After consenting the patient, agreeable to receive transfusion.   Likely secondary to chronic kidney disease.  Based on new hypoxia do believe patient would benefit from admission the hospital for further medical management.  Did have some improvement after nebulizers.   [PR]    Clinical Course User Index [PR] Willy Eddy, MD     As part of my medical decision making, I reviewed the following data  within the electronic MEDICAL RECORD NUMBER Nursing notes reviewed and incorporated, Labs reviewed, notes from prior ED visits.   ____________________________________________   FINAL CLINICAL IMPRESSION(S) / ED DIAGNOSES  Final diagnoses:  Acute respiratory failure with hypoxia (HCC)  Weakness  Anemia, unspecified type  AKI (acute kidney  injury) (HCC)      NEW MEDICATIONS STARTED DURING THIS VISIT:  New Prescriptions   No medications on file     Note:  This document was prepared using Dragon voice recognition software and may include unintentional dictation errors.    Willy Eddy, MD 08/10/18 1540    Willy Eddy, MD 08/10/18 [phone]

## 2018-08-10 NOTE — ED Notes (Signed)
Amy RN here to transport patient to floor

## 2018-08-10 NOTE — Progress Notes (Signed)
Family Meeting Note  Advance Directive:yes  Today a meeting took place with the Patient.  Patient is able to participate  The following clinical team members were present during this meeting:MD  The following were discussed:Patient's diagnosis: resp failure, Patient's progosis: Unable to determine and Goals for treatment: Full Code  Additional follow-up to be provided: prn  Time spent during discussion:20 minutes  Montell D Salary, MD  

## 2018-08-10 NOTE — ED Notes (Signed)
Per MD Salary give patient solumedrol for blood sugar. [facility]

## 2018-08-10 NOTE — Progress Notes (Addendum)
Hypoglycemic Event  CBG: 45  Treatment: juice, food  Symptoms: none  Follow-up CBG: Time: 1810 CBG Result:  Possible Reasons for Event: in ED all day   Comments/MD notified: Salary notified.  Encouraged patient to eat and drink.  He gave verbal order to d/c her diabetic medication.  She is asymptomatic.      Patty Roberts Patty Roberts

## 2018-08-10 NOTE — ED Notes (Signed)
Patient given orange juice, peanut buttter crackers, and encouraged to eat her sandwich tray

## 2018-08-10 NOTE — H&P (Signed)
[employer] - [place] [facility]   PATIENT NAME: Patty Roberts    MR#:  [id number]  DATE OF BIRTH:  [birth year]  DATE OF ADMISSION:  08/10/2018  PRIMARY CARE PHYSICIAN: Particia Nearing, PA-C   REQUESTING/REFERRING PHYSICIAN:   CHIEF COMPLAINT:   Chief Complaint  Patient presents with  . Shortness of Breath    HISTORY OF PRESENT ILLNESS: Patty Roberts  is a 84 y.o. female with a known history per below, presented to the emergency room with 3 days of progressive acute on chronic shortness of breath, made better with breathing treatments, dyspnea on exertion, in the emergency room patient was hypoxic-placed on 2 L via nasal cannula continuous, EKG noted for ventricular paced rhythm, noted tachypnea, hemoglobin 8.5, creatinine 3.4 with baseline around 1.7, BNP greater than 1600, chest x-ray noted for edema, patient is now been admitted for acute hypoxic respiratory failure most likely secondary to multifactorial process that includes congestive heart failure and COPD exacerbation.  PAST MEDICAL HISTORY:   Past Medical History:  Diagnosis Date  . CHF (congestive heart failure) (HCC)   . Chronic kidney disease   . Diabetes mellitus without complication (HCC)   . GERD (gastroesophageal reflux disease)   . Hyperlipidemia   . Hypertension   . Hyponatremia     PAST SURGICAL HISTORY:  Past Surgical History:  Procedure Laterality Date  . ABDOMINAL HYSTERECTOMY    . ANKLE FRACTURE SURGERY    . HERNIA REPAIR    . PACEMAKER INSERTION     x2  . vertebral fix      SOCIAL HISTORY:  Social History   Tobacco Use  . Smoking status: Never Smoker  . Smokeless tobacco: Never Used  Substance Use Topics  . Alcohol use: No    Alcohol/week: 0.0 standard drinks    FAMILY HISTORY:  Family History  Problem Relation Age of Onset  . Hypertension Mother   . Thyroid disease Daughter   . Heart disease Son        heart attack    DRUG ALLERGIES:  Allergies  Allergen  Reactions  . Phenameth [Promethazine]   . Statins Other (See Comments)    Leg pain     REVIEW OF SYSTEMS:   CONSTITUTIONAL: No fever,+ fatigue or weakness.  EYES: No blurred or double vision.  EARS, NOSE, AND THROAT: No tinnitus or ear pain.  RESPIRATORY: + cough, shortness of breath, wheezing  CARDIOVASCULAR: No chest pain, orthopnea, edema.  GASTROINTESTINAL: No nausea, vomiting, diarrhea or abdominal pain.  GENITOURINARY: No dysuria, hematuria.  ENDOCRINE: No polyuria, nocturia,  HEMATOLOGY: No anemia, easy bruising or bleeding SKIN: No rash or lesion. MUSCULOSKELETAL: No joint pain or arthritis.   NEUROLOGIC: No tingling, numbness, weakness.  PSYCHIATRY: No anxiety or depression.   MEDICATIONS AT HOME:  Prior to Admission medications   Medication Sig Start Date End Date Taking? Authorizing Provider  amLODipine (NORVASC) 5 MG tablet Take 5 mg by mouth daily.   Yes [provider]  benzonatate (TESSALON) 200 MG capsule TAKE 1 CAPSULE(200 MG) BY MOUTH THREE TIMES DAILY AS NEEDED Patient taking differently: Take 200 mg by mouth 3 (three) times daily as needed for cough.  08/07/18  Yes Particia Nearing, PA-C  carvedilol (COREG) 12.5 MG tablet Take 1 tablet (12.5 mg total) by mouth 2 (two) times daily with a meal. Instead of Bystolic 07/14/18  Yes Particia Nearing, PA-C  cloNIDine (CATAPRES - DOSED IN MG/24 HR) 0.3 mg/24hr patch APPLY 1 PATCH TO  SKIN ONCE A WEEK 10/20/17  Yes Gabriel Cirri, NP  furosemide (LASIX) 40 MG tablet Take 40 mg by mouth daily.  04/21/18  Yes [provider]  glipiZIDE (GLUCOTROL) 5 MG tablet Take 1 tablet (5 mg total) by mouth 2 (two) times daily before a meal. 06/03/18  Yes Particia Nearing, PA-C  guaiFENesin (MUCINEX) 600 MG 12 hr tablet Take 1 tablet (600 mg total) by mouth 2 (two) times daily as needed. Patient taking differently: Take 600 mg by mouth 2 (two) times daily as needed for cough or to loosen phlegm.  05/25/18   Yes Particia Nearing, PA-C  meclizine (ANTIVERT) 25 MG tablet Take 1 tablet (25 mg total) by mouth 2 (two) times daily as needed for dizziness. 07/14/18  Yes Particia Nearing, PA-C  omeprazole (PRILOSEC) 20 MG capsule Take 1 capsule (20 mg total) by mouth every other day. Patient taking differently: Take 20 mg by mouth daily as needed (Acid reflux).  10/20/17  Yes Gabriel Cirri, NP  PROAIR HFA 108 (90 Base) MCG/ACT inhaler INHALE 2 PUFFS BY MOUTH EVERY 6 HOURS IF NEEDED FOR SHORTNESS OF BREATH OR WHEEZING Patient taking differently: Inhale 2 puffs into the lungs every 6 (six) hours as needed for wheezing or shortness of breath.  05/21/18  Yes Particia Nearing, PA-C  rosuvastatin (CRESTOR) 5 MG tablet TAKE 1 TABLET DAILY Patient taking differently: Take 5 mg by mouth every other day.  06/02/18  Yes Particia Nearing, PA-C  ondansetron (ZOFRAN ODT) 4 MG disintegrating tablet Take 1 tablet (4 mg total) by mouth every 8 (eight) hours as needed for nausea or vomiting. 03/13/18   Jene Every, MD      PHYSICAL EXAMINATION:   VITAL SIGNS: Blood pressure (!) 140/58, pulse (!) 50, temperature 97.8 F (36.6 C), temperature source Oral, resp. rate (!) 22, height 5\' 4"  (1.626 m), weight 96.6 kg, SpO2 94 %.  GENERAL:  84 y.o.-year-old patient lying in the bed with no acute distress.  Extreme morbid obesity EYES: Pupils equal, round, reactive to light and accommodation. No scleral icterus. Extraocular muscles intact.  HEENT: Head atraumatic, normocephalic. Oropharynx and nasopharynx clear.  NECK:  Supple, no jugular venous distention. No thyroid enlargement, no tenderness.  LUNGS: Diminished breath sounds bilaterally with Rales at bases. No use of accessory muscles of respiration.  CARDIOVASCULAR: S1, S2 normal. No murmurs, rubs, or gallops.  ABDOMEN: Soft, nontender, nondistended. Bowel sounds present. No organomegaly or mass.  EXTREMITIES: No pedal edema, cyanosis, or clubbing.   NEUROLOGIC: Cranial nerves II through XII are intact. Muscle strength 5/5 in all extremities. Sensation intact. Gait not checked.  PSYCHIATRIC: The patient is alert and oriented x 3.  SKIN: No obvious rash, lesion, or ulcer.   LABORATORY PANEL:   CBC Recent Labs  Lab 08/10/18 1348  WBC 3.7*  HGB 8.5*  HCT 26.4*  PLT 218  MCV 121.1*  MCH 39.0*  MCHC 32.2  RDW Not Measured   ------------------------------------------------------------------------------------------------------------------  Chemistries  Recent Labs  Lab 08/10/18 1348  NA 137  K 4.4  CL 101  CO2 29  GLUCOSE 52*  BUN 59*  CREATININE 3.49*  CALCIUM 7.7*   ------------------------------------------------------------------------------------------------------------------ estimated creatinine clearance is 13.5 mL/min (A) (by C-G formula based on SCr of 3.49 mg/dL (H)). ------------------------------------------------------------------------------------------------------------------ No results for input(s): TSH, T4TOTAL, T3FREE, THYROIDAB in the last 72 hours.  Invalid input(s): FREET3   Coagulation profile No results for input(s): INR, PROTIME in the last 168 hours. ------------------------------------------------------------------------------------------------------------------- No results  for input(s): DDIMER in the last 72 hours. -------------------------------------------------------------------------------------------------------------------  Cardiac Enzymes Recent Labs  Lab 08/10/18 1348  TROPONINI 0.03*   ------------------------------------------------------------------------------------------------------------------ Invalid input(s): POCBNP  ---------------------------------------------------------------------------------------------------------------  Urinalysis    Component Value Date/Time   COLORURINE STRAW (A) 03/23/2018 1400   APPEARANCEUR CLEAR (A) 03/23/2018 1400   APPEARANCEUR  Cloudy 07/03/2014 1820   LABSPEC 1.009 03/23/2018 1400   LABSPEC 1.019 07/03/2014 1820   PHURINE 6.0 03/23/2018 1400   GLUCOSEU 50 (A) 03/23/2018 1400   GLUCOSEU Negative 07/03/2014 1820   HGBUR SMALL (A) 03/23/2018 1400   BILIRUBINUR NEGATIVE 03/23/2018 1400   BILIRUBINUR 1+ 07/03/2014 1820   KETONESUR NEGATIVE 03/23/2018 1400   PROTEINUR >=300 (A) 03/23/2018 1400   NITRITE NEGATIVE 03/23/2018 1400   LEUKOCYTESUR NEGATIVE 03/23/2018 1400   LEUKOCYTESUR 1+ 07/03/2014 1820     RADIOLOGY: Dg Chest 2 View  Result Date: 08/10/2018 CLINICAL DATA:  84 y/o  F; worsening shortness of breath. EXAM: CHEST - 2 VIEW COMPARISON:  03/23/2018 chest radiograph. FINDINGS: Stable cardiomegaly given projection and technique. Aortic calcific atherosclerosis. Three lead pacemaker. Interstitial pulmonary edema and small bilateral pleural effusions. No focal consolidation or pneumothorax. Exaggerated thoracic kyphosis with stable mild chronic compression deformities of lower thoracic spine. IMPRESSION: Interstitial pulmonary edema and small bilateral pleural effusions. Stable cardiomegaly. Electronically Signed   By: Mitzi HansenLance  Furusawa-Stratton M.D.   On: 08/10/2018 14:17    EKG: Orders placed or performed during the hospital encounter of 08/10/18  . EKG 12-Lead  . EKG 12-Lead  . ED EKG  . ED EKG    IMPRESSION AND PLAN: *Acute hypoxic respiratory failure Most likely secondary to multifactorial process that includes congestive heart failure and COPD exacerbations Supplemental oxygen wean as tolerated  *Acute COPD exacerbation Solu-Medrol with tapering as tolerated, mucolytic agents, inhaled corticosteroids twice daily, scheduled breathing treatments, respiratory therapy to see, wean O2 off as tolerated  *Acute on chronic diastolic congestive heart failure exacerbation, mild Congestive heart failure protocol, IV Lasix twice daily, beta-blocker therapy, Crestor, strict I&O monitoring, daily weights,  continue close medical monitoring Most recent echocardiogram from April 2019 noted for ejection fraction of 60 to 65%  *Chronic benign essential hypertension Hold Norvasc, continue other antihypertensive agents, IV hydralazine as needed systolic blood pressure greater than 160, vitals per routine, make changes as per necessary  *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 Management plans discussed with the patient, family and they are in agreement.  CODE STATUS:full Code Status History    Date Active Date Inactive Code Status Order ID Comments User Context   12/20/2017 1507 12/24/2017 1950 Full Code [id number]  Shaune Pollackhen, Qing, MD Inpatient       TOTAL TIME TAKING CARE OF THIS PATIENT: 40 minutes.    Evelena AsaMontell D Maricella Filyaw M.D on 08/10/2018   Between 7am to 6pm - Pager - [phone]  After 6pm go to [email or web address] - password [email or web address] [facility]  [facility]  Office  [phone]  CC: Primary care physician; Particia NearingLane, Rachel Elizabeth, PA-C   Note: This dictation was prepared with Dragon dictation along with smaller phrase technology. Any transcriptional errors that result from this process are unintentional.

## 2018-08-10 NOTE — ED Triage Notes (Signed)
Pt to ED via EMS from home. Pt c/o shortness of breath xmonths that has improved recently. Pt states she uses an inhaler at home that helps but the SOB worsens with exertion. Pt has hx of CHF. Pt does not use o2 at home, placed on 2L. 92% room air

## 2018-08-10 NOTE — ED Notes (Signed)
Patient transported to X-ray 

## 2018-08-11 ENCOUNTER — Ambulatory Visit: Payer: Medicare Other | Admitting: Cardiovascular Disease

## 2018-08-11 ENCOUNTER — Inpatient Hospital Stay
Admit: 2018-08-11 | Discharge: 2018-08-11 | Disposition: A | Discharge status: Home / Self Care | Payer: Medicare Other | Attending: Family Medicine | Admitting: Family Medicine

## 2018-08-11 LAB — CBC
HCT: 25.3 % — ABNORMAL LOW (ref 36.0–46.0)
Hemoglobin: 8 g/dL — ABNORMAL LOW (ref 12.0–15.0)
MCH: 39 pg — ABNORMAL HIGH (ref 26.0–34.0)
MCHC: 31.6 g/dL (ref 30.0–36.0)
MCV: 123.4 fL — ABNORMAL HIGH (ref 80.0–100.0)
Platelets: 184 10*3/uL (ref 150–400)
RBC: 2.05 MIL/uL — ABNORMAL LOW (ref 3.87–5.11)
RDW: 24.1 % — ABNORMAL HIGH (ref 11.5–15.5)
WBC: 3.9 10*3/uL — ABNORMAL LOW (ref 4.0–10.5)
nRBC: 1 % — ABNORMAL HIGH (ref 0.0–0.2)

## 2018-08-11 LAB — GLUCOSE, CAPILLARY
Glucose-Capillary: 397 mg/dL — ABNORMAL HIGH (ref 70–99)
Glucose-Capillary: 254 mg/dL — ABNORMAL HIGH (ref 70–99)
Glucose-Capillary: 179 mg/dL — ABNORMAL HIGH (ref 70–99)
Glucose-Capillary: 123 mg/dL — ABNORMAL HIGH (ref 70–99)

## 2018-08-11 LAB — BASIC METABOLIC PANEL
Anion gap: 7 (ref 5–15)
BUN: 61 mg/dL — ABNORMAL HIGH (ref 8–23)
CO2: 27 mmol/L (ref 22–32)
Calcium: 7.4 mg/dL — ABNORMAL LOW (ref 8.9–10.3)
Chloride: 100 mmol/L (ref 98–111)
Creatinine, Ser: 3.47 mg/dL — ABNORMAL HIGH (ref 0.44–1.00)
GFR calc Af Amer: 13 mL/min — ABNORMAL LOW (ref >60)
GFR calc non Af Amer: 11 mL/min — ABNORMAL LOW (ref >60)
Glucose, Bld: 258 mg/dL — ABNORMAL HIGH (ref 70–99)
Potassium: 5.6 mmol/L — ABNORMAL HIGH (ref 3.5–5.1)
SODIUM: 134 mmol/L — AB (ref 135–145)

## 2018-08-11 LAB — ECHOCARDIOGRAM COMPLETE
Height: 64 in
Weight: 3484.8 oz

## 2018-08-11 LAB — TROPONIN I
Troponin I: 0.03 ng/mL (ref <0.03)
Troponin I: <0.03 ng/mL (ref <0.03)

## 2018-08-11 LAB — VITAMIN B12: VITAMIN B 12: 14 pg/mL — AB (ref 180–914)

## 2018-08-11 LAB — BRAIN NATRIURETIC PEPTIDE: B Natriuretic Peptide: 1843 pg/mL — ABNORMAL HIGH (ref 0.0–100.0)

## 2018-08-11 MED ORDER — PHENOL 1.4 % MT LIQD
1.0000 | OROMUCOSAL | Status: DC | PRN
  Administered 2018-08-15: 1 via OROMUCOSAL
  Filled 2018-08-11 (×2): qty 177

## 2018-08-11 MED ORDER — ALUM & MAG HYDROXIDE-SIMETH 200-200-20 MG/5ML PO SUSP
30.0000 mL | Freq: Four times a day (QID) | ORAL | Status: DC | PRN
  Administered 2018-08-11: 30 mL via ORAL
  Filled 2018-08-11: qty 30

## 2018-08-11 MED ORDER — INSULIN GLARGINE 100 UNIT/ML ~~LOC~~ SOLN
15.0000 [IU] | Freq: Every day | SUBCUTANEOUS | Status: DC
  Administered 2018-08-11 – 2018-08-13 (×3): 15 [IU] via SUBCUTANEOUS
  Filled 2018-08-11 (×4): qty 0.15

## 2018-08-11 NOTE — Progress Notes (Signed)
Inpatient Diabetes Program Recommendations  AACE/ADA: New Consensus Statement on Inpatient Glycemic Control (2015)  Target Ranges:  Prepandial:   less than 140 mg/dL      Peak postprandial:   less than 180 mg/dL (1-2 hours)      Critically ill patients:  140 - 180 mg/dL   Results for Patty Roberts, Patty Roberts (MRN [id number]) as of 08/11/2018 12:31  Ref. Range 08/10/2018 18:00 08/10/2018 18:37 08/10/2018 19:19 08/10/2018 21:10  Glucose-Capillary Latest Ref Range: 70 - 99 mg/dL 50 (L) 45 (L) 83 119113 (H)   Results for Patty Roberts, Patty Roberts (MRN [id number]) as of 08/11/2018 12:31  Ref. Range 08/11/2018 08:33 08/11/2018 11:44  Glucose-Capillary Latest Ref Range: 70 - 99 mg/dL 130397 (H)  20 units NOVOLOG  254 (H)  11 units NOVOLOG      Admit Acute hypoxic respiratory failure most likely secondary to multifactorial process that includes CHF and COPD exacerbation  History: DM, CKD  Home DM Meds: Glipizide 5 mg BID  Current Orders: Novolog Resistant Correction Scale/ SSI (0-20 units) TID AC + HS (started this AM).       Getting Solumedrol 60 mg Q8H.   HYPO yest at 6pm (per records, took Glipizide at home on 12/09 so this is likely why she was HYPO).   CBG 397 mg/dl this AM.    MD- If patient to remain on IV Steroids, may consider adding Lantus insulin to in-hospital insulin regimen:  Lantus 15 units QHS (0.15 units/kg dosing based on weight of 98 kg)     --Will follow patient during hospitalization--  Ambrose FinlandJeannine Johnston Zyliah Schier RN, MSN, CDE Diabetes Coordinator Inpatient Glycemic Control Team Team Pager: [phone] (8a-5p)

## 2018-08-11 NOTE — Care Management Note (Signed)
Case Management Note  Patient Details  Name: Cheri Fowlerrma W Cupples MRN: [id number] Date of Birth: 10/02/1931  Subjective/Objective:   Patient is from home with daughters.  She has been admitted for CHF exacerbation.  Currently on 2L acute O2.  No oxygen at home.  Receiving scheduled IV lasix 20mg  BID. She is current with her PCP.  Denies difficulties accessing medical care or medications.  She has a functioning scale at home and weighs daily.  She uses a walker occasionally.  Has a heart failure clinic appointment scheduled.  Will continue to follow through discharge.                    Action/Plan:   Expected Discharge Date:                  Expected Discharge Plan:  Home w Home Health Services  In-House Referral:     Discharge planning Services  CM Consult  Post Acute Care Choice:    Choice offered to:     DME Arranged:    DME Agency:     HH Arranged:  RN, PT, Nurse's Aide HH Agency:     Status of Service:  In process, will continue to follow  If discussed at Long Length of Stay Meetings, dates discussed:    Additional Comments:  Sherren KernsJennifer L Anielle Headrick, RN 08/11/2018, 4:13 PM

## 2018-08-11 NOTE — Progress Notes (Signed)
*  PRELIMINARY RESULTS* Echocardiogram 2D Echocardiogram has been performed.  Cristela BlueHege, Cillian Gwinner 08/11/2018, 11:59 AM

## 2018-08-11 NOTE — Progress Notes (Signed)
[facility]  ROUNDING NOTE   Subjective:   Ms. Patty Fowlerrma W Roberts admitted to [facility] on 08/10/2018 for Weakness [R53.1] Acute respiratory failure with hypoxia (HCC) [J96.01] AKI (acute kidney injury) (HCC) [N17.9] Anemia, unspecified type [D64.9]   Patient states she has being having shortness of breath for last 1 day. Weight gain since thanksgiving.   Daughter is at bedside. No changes in medications. Requiring Oxygen. Started on IV furosemide 20mg  q12 .  Pending echocardiogram  Objective:  Vital signs in last 24 hours:  Temp:  [97.4 F (36.3 C)-98.2 F (36.8 C)] 98.2 F (36.8 C) (12/10 0612) Pulse Rate:  [45-69] 65 (12/10 0612) Resp:  [16-26] 16 (12/10 0612) BP: (112-140)/(45-70) 134/45 (12/10 0612) SpO2:  [90 %-98 %] 95 % (12/10 0757) Weight:  [96.6 kg-98.8 kg] 98.8 kg (12/10 0612)  Weight change:  Filed Weights   08/10/18 1323 08/11/18 0612  Weight: 96.6 kg 98.8 kg    Intake/Output: I/O last 3 completed shifts: In: 23.6 [I.V.:23.6] Out: 150 [Urine:150]   Intake/Output this shift:  No intake/output data recorded.  Physical Exam: General: NAD,   Head: Normocephalic, atraumatic. Moist oral mucosal membranes  Eyes: Anicteric, PERRL  Neck: Supple, trachea midline  Lungs:  Clear to auscultation  Heart: Regular rate and rhythm  Abdomen:  Soft, nontender,   Extremities: no peripheral edema.  Neurologic: Nonfocal, moving all four extremities  Skin: No lesions        Basic Metabolic Panel: Recent Labs  Lab 08/10/18 1348 08/11/18 0555  NA 137 134*  K 4.4 5.6*  CL 101 100  CO2 29 27  GLUCOSE 52* 258*  BUN 59* 61*  CREATININE 3.49* 3.47*  CALCIUM 7.7* 7.4*    Liver Function Tests: No results for input(s): AST, ALT, ALKPHOS, BILITOT, PROT, ALBUMIN in the last 168 hours. No results for input(s): LIPASE, AMYLASE in the last 168 hours. No results for input(s): AMMONIA in the last 168 hours.  CBC: Recent Labs  Lab 08/10/18 1348 08/11/18 0736  WBC  3.7* 3.9*  HGB 8.5* 8.0*  HCT 26.4* 25.3*  MCV 121.1* 123.4*  PLT 218 184    Cardiac Enzymes: Recent Labs  Lab 08/10/18 1348 08/10/18 1909 08/10/18 2345 08/11/18 0555  TROPONINI 0.03* <0.03 0.03* <0.03    BNP: Invalid input(s): POCBNP  CBG: Recent Labs  Lab 08/10/18 1837 08/10/18 1919 08/10/18 2110 08/11/18 0833 08/11/18 1144  GLUCAP 45* 83 113* 397* 254*    Microbiology: No results found for this or any previous visit.  Coagulation Studies: No results for input(s): LABPROT, INR in the last 72 hours.  Urinalysis: No results for input(s): COLORURINE, LABSPEC, PHURINE, GLUCOSEU, HGBUR, BILIRUBINUR, KETONESUR, PROTEINUR, UROBILINOGEN, NITRITE, LEUKOCYTESUR in the last 72 hours.  Invalid input(s): APPERANCEUR    Imaging: Dg Chest 2 View  Result Date: 08/10/2018 CLINICAL DATA:  84 y/o  F; worsening shortness of breath. EXAM: CHEST - 2 VIEW COMPARISON:  03/23/2018 chest radiograph. FINDINGS: Stable cardiomegaly given projection and technique. Aortic calcific atherosclerosis. Three lead pacemaker. Interstitial pulmonary edema and small bilateral pleural effusions. No focal consolidation or pneumothorax. Exaggerated thoracic kyphosis with stable mild chronic compression deformities of lower thoracic spine. IMPRESSION: Interstitial pulmonary edema and small bilateral pleural effusions. Stable cardiomegaly. Electronically Signed   By: Mitzi HansenLance  Furusawa-Stratton M.D.   On: 08/10/2018 14:17     Medications:   . sodium chloride     . budesonide (PULMICORT) nebulizer solution  0.5 mg Nebulization BID  . carvedilol  12.5 mg Oral BID  WC  . [START ON 08/16/2018] cloNIDine  0.3 mg Transdermal Weekly  . furosemide  20 mg Intravenous BID  . heparin  5,000 Units Subcutaneous Q8H  . insulin aspart  0-20 Units Subcutaneous TID WC  . insulin aspart  0-5 Units Subcutaneous QHS  . ipratropium-albuterol  3 mL Nebulization Q6H  . methylPREDNISolone (SOLU-MEDROL) injection  60 mg  Intravenous Q8H  . pantoprazole  40 mg Oral Daily  . rosuvastatin  5 mg Oral QODAY  . sodium chloride flush  3 mL Intravenous Q12H   sodium chloride, acetaminophen, benzonatate, guaiFENesin, hydrALAZINE, ondansetron (ZOFRAN) IV, ondansetron, phenol, sodium chloride flush  Assessment/ Plan:  [name] is a 84 y.o. black female with diabetes mellitus type II, aortic stenosis, hypertension, hypercholesterolemia who was admitted to [facility] on 08/10/2018 for Weakness [R53.1] Acute respiratory failure with hypoxia (HCC) [J96.01] AKI (acute kidney injury) (HCC) [N17.9] Anemia, unspecified type [D64.9]  1. Acute renal failure with hyponatremia and hyperkalemia on chronic kidney disease stage IV with nephrotic range proteinuria: baseline creatinine of 1.85, GFR of 29 on 03/26/18.  Chronic kidney disease secondary to diabetic nephropathy Acute renal failure secondary to acute cardiorenal syndrome Discussed dialysis with patient.  - No fluid appreciated on examination. Check BNP  2. Hypertension with aortic stenosis: new echocardiogram pending. Echocardiogram on 4/19 shows preserved systolic function - IV furosemide 20mg  IV.   3. Acute exacerbation of COPD - systemic steroids.  - O2  - supportive care.    LOS: 1 Patty Roberts 12/10/201911:52 AM

## 2018-08-11 NOTE — Progress Notes (Addendum)
[employer] - [place] at [facility]   PATIENT NAME: Patty Roberts    MR#:  [id number]  DATE OF BIRTH:  01/03/1932  SUBJECTIVE:  CHIEF COMPLAINT:   Chief Complaint  Patient presents with  . Shortness of Breath  SOB, daughter at bedside REVIEW OF SYSTEMS:  Review of Systems  Constitutional: Negative for chills, fever and weight loss.  HENT: Negative for nosebleeds and sore throat.   Eyes: Negative for blurred vision.  Respiratory: Positive for shortness of breath. Negative for cough and wheezing.   Cardiovascular: Negative for chest pain, orthopnea, leg swelling and PND.  Gastrointestinal: Negative for abdominal pain, constipation, diarrhea, heartburn, nausea and vomiting.  Genitourinary: Negative for dysuria and urgency.  Musculoskeletal: Negative for back pain.  Skin: Negative for rash.  Neurological: Negative for dizziness, speech change, focal weakness and headaches.  Endo/Heme/Allergies: Does not bruise/bleed easily.  Psychiatric/Behavioral: Negative for depression.   DRUG ALLERGIES:   Allergies  Allergen Reactions  . Phenameth [Promethazine]   . Statins Other (See Comments)    Leg pain    VITALS:  Blood pressure (!) 121/45, pulse 66, temperature 97.7 F (36.5 C), temperature source Oral, resp. rate 19, height 5\' 4"  (1.626 m), weight 98.8 kg, SpO2 98 %. PHYSICAL EXAMINATION:  Physical Exam  Constitutional: She is oriented to person, place, and time.  HENT:  Head: Normocephalic and atraumatic.  Eyes: Pupils are equal, round, and reactive to light. Conjunctivae and EOM are normal.  Neck: Normal range of motion. Neck supple. No tracheal deviation present. No thyromegaly present.  Cardiovascular: Normal rate, regular rhythm and normal heart sounds.  Pulmonary/Chest: Effort normal and breath sounds normal. No respiratory distress. She has no wheezes. She exhibits no tenderness.  Abdominal: Soft. Bowel sounds are normal. She exhibits no distension. There  is no tenderness.  Musculoskeletal: Normal range of motion.  Neurological: She is alert and oriented to person, place, and time. No cranial nerve deficit.  Skin: Skin is warm and dry. No rash noted.   LABORATORY PANEL:  Female CBC Recent Labs  Lab 08/11/18 0736  WBC 3.9*  HGB 8.0*  HCT 25.3*  PLT 184   ------------------------------------------------------------------------------------------------------------------ Chemistries  Recent Labs  Lab 08/11/18 0555  NA 134*  K 5.6*  CL 100  CO2 27  GLUCOSE 258*  BUN 61*  CREATININE 3.47*  CALCIUM 7.4*   RADIOLOGY:  No results found. ASSESSMENT AND PLAN:   *Acute hypoxic respiratory failure - likely multifactorial process that includes congestive heart failure and COPD exacerbations Supplemental oxygen wean as tolerated  *Acute COPD exacerbation Solu-Medrol with tapering as tolerated, mucolytic agents, inhaled corticosteroids twice daily, scheduled breathing treatments, respiratory therapy to see, wean O2 off as tolerated  *Acute on chronic diastolic congestive heart failure exacerbation, mild Congestive heart failure protocol, IV Lasix twice daily, beta-blocker therapy, Crestor, strict I&O monitoring, daily weights, continue close medical monitoring Most recent echocardiogram from April 2019 noted for ejection fraction of 60 to 65%  *Chronic benign essential hypertension Hold Norvasc, continue other antihypertensive agents, IV hydralazine as needed systolic blood pressure greater than 160, make changes as per necessary  *Acute renal failure with hyponatremia and hyperkalemia on chronic kidney disease stage IV with nephrotic range proteinuria: - likely cardiorenal syndrome - c/s nephro for eval of HD need  *Chronic diabetes mellitus type 2 Continue home regiment, sliding scale insulin with Accu-Cheks per routine - add lantus 15 units qhs due to being on steroids  * Morbid Obesity - counseled, PCP f/up  All the  records are reviewed and case discussed with Care Management/Social Worker. Management plans discussed with the patient, family (daughter at bedside) and they are in agreement.  CODE STATUS: Full Code  TOTAL TIME TAKING CARE OF THIS PATIENT: 35 minutes.   More than 50% of the time was spent in counseling/coordination of care: YES  POSSIBLE D/C IN 2-3 DAYS, DEPENDING ON CLINICAL CONDITION.   Delfino LovettVipul Peachie Barkalow M.D on 08/11/2018 at 5:20 PM  Between 7am to 6pm - Pager - [phone]  After 6pm go to [email or web address] - password [email or web address] [facility]  [facility] [place] [facility]  Office  [phone]  CC: Primary care physician; Particia NearingLane, Rachel Elizabeth, PA-C  Note: This dictation was prepared with Dragon dictation along with smaller phrase technology. Any transcriptional errors that result from this process are unintentional.

## 2018-08-12 ENCOUNTER — Ambulatory Visit: Payer: Medicare Other | Admitting: Family

## 2018-08-12 LAB — BASIC METABOLIC PANEL
Anion gap: 8 (ref 5–15)
BUN: 71 mg/dL — ABNORMAL HIGH (ref 8–23)
CO2: 27 mmol/L (ref 22–32)
Calcium: 7.7 mg/dL — ABNORMAL LOW (ref 8.9–10.3)
Chloride: 98 mmol/L (ref 98–111)
Creatinine, Ser: 3.91 mg/dL — ABNORMAL HIGH (ref 0.44–1.00)
GFR calc Af Amer: 12 mL/min — ABNORMAL LOW (ref >60)
GFR calc non Af Amer: 10 mL/min — ABNORMAL LOW (ref >60)
Glucose, Bld: 147 mg/dL — ABNORMAL HIGH (ref 70–99)
POTASSIUM: 5.9 mmol/L — AB (ref 3.5–5.1)
SODIUM: 133 mmol/L — AB (ref 135–145)

## 2018-08-12 LAB — CBC
HCT: 25.6 % — ABNORMAL LOW (ref 36.0–46.0)
Hemoglobin: 7.8 g/dL — ABNORMAL LOW (ref 12.0–15.0)
MCH: 38 pg — ABNORMAL HIGH (ref 26.0–34.0)
MCHC: 30.5 g/dL (ref 30.0–36.0)
MCV: 124.9 fL — ABNORMAL HIGH (ref 80.0–100.0)
Platelets: 185 10*3/uL (ref 150–400)
RBC: 2.05 MIL/uL — ABNORMAL LOW (ref 3.87–5.11)
RDW: 23.5 % — ABNORMAL HIGH (ref 11.5–15.5)
WBC: 4.8 10*3/uL (ref 4.0–10.5)
nRBC: 1 % — ABNORMAL HIGH (ref 0.0–0.2)

## 2018-08-12 LAB — GLUCOSE, CAPILLARY
GLUCOSE-CAPILLARY: 178 mg/dL — AB (ref 70–99)
Glucose-Capillary: 146 mg/dL — ABNORMAL HIGH (ref 70–99)
Glucose-Capillary: 188 mg/dL — ABNORMAL HIGH (ref 70–99)
Glucose-Capillary: 208 mg/dL — ABNORMAL HIGH (ref 70–99)

## 2018-08-12 MED ORDER — SODIUM ZIRCONIUM CYCLOSILICATE 10 G PO PACK
10.0000 g | PACK | Freq: Every day | ORAL | Status: DC
  Filled 2018-08-12: qty 1

## 2018-08-12 MED ORDER — SODIUM ZIRCONIUM CYCLOSILICATE 10 G PO PACK
10.0000 g | PACK | Freq: Every day | ORAL | Status: DC
  Administered 2018-08-12 – 2018-08-18 (×6): 10 g via ORAL
  Filled 2018-08-12 (×7): qty 1

## 2018-08-12 NOTE — Progress Notes (Signed)
FIO2 increased from 2L to 4L to keep patient's SAT above 92%

## 2018-08-12 NOTE — Progress Notes (Addendum)
[employer] - [place] at [facility]   PATIENT NAME: Patty Roberts    MR#:  [id number]  DATE OF BIRTH:  1931/10/19  SUBJECTIVE:  CHIEF COMPLAINT:   Chief Complaint  Patient presents with  . Shortness of Breath  SOB, daughter at bedside, worsening creat, K 5.9 REVIEW OF SYSTEMS:  Review of Systems  Constitutional: Negative for chills, fever and weight loss.  HENT: Negative for nosebleeds and sore throat.   Eyes: Negative for blurred vision.  Respiratory: Positive for shortness of breath. Negative for cough and wheezing.   Cardiovascular: Negative for chest pain, orthopnea, leg swelling and PND.  Gastrointestinal: Negative for abdominal pain, constipation, diarrhea, heartburn, nausea and vomiting.  Genitourinary: Negative for dysuria and urgency.  Musculoskeletal: Negative for back pain.  Skin: Negative for rash.  Neurological: Negative for dizziness, speech change, focal weakness and headaches.  Endo/Heme/Allergies: Does not bruise/bleed easily.  Psychiatric/Behavioral: Negative for depression.   DRUG ALLERGIES:   Allergies  Allergen Reactions  . Phenameth [Promethazine]   . Statins Other (See Comments)    Leg pain    VITALS:  Blood pressure (!) 108/59, pulse 62, temperature (!) 97.4 F (36.3 C), temperature source Oral, resp. rate 20, height 5\' 4"  (1.626 m), weight 103.5 kg, SpO2 93 %. PHYSICAL EXAMINATION:  Physical Exam  Constitutional: She is oriented to person, place, and time.  HENT:  Head: Normocephalic and atraumatic.  Eyes: Pupils are equal, round, and reactive to light. Conjunctivae and EOM are normal.  Neck: Normal range of motion. Neck supple. No tracheal deviation present. No thyromegaly present.  Cardiovascular: Normal rate, regular rhythm and normal heart sounds.  Pulmonary/Chest: Effort normal and breath sounds normal. No respiratory distress. She has no wheezes. She exhibits no tenderness.  Abdominal: Soft. Bowel sounds are normal. She  exhibits no distension. There is no tenderness.  Musculoskeletal: Normal range of motion.  Neurological: She is alert and oriented to person, place, and time. No cranial nerve deficit.  Skin: Skin is warm and dry. No rash noted.   LABORATORY PANEL:  Female CBC Recent Labs  Lab 08/12/18 0320  WBC 4.8  HGB 7.8*  HCT 25.6*  PLT 185   ------------------------------------------------------------------------------------------------------------------ Chemistries  Recent Labs  Lab 08/12/18 0320  NA 133*  K 5.9*  CL 98  CO2 27  GLUCOSE 147*  BUN 71*  CREATININE 3.91*  CALCIUM 7.7*   RADIOLOGY:  No results found. ASSESSMENT AND PLAN:   *Acute hypoxic respiratory failure - likely multifactorial process that includes congestive heart failure and COPD exacerbations - supplemental oxygen wean as tolerated  *Acute COPD exacerbation - Solu-Medrol with tapering as tolerated, mucolytic agents, inhaled corticosteroids twice daily, scheduled breathing treatments, respiratory therapy to see, wean O2 off as tolerated  *Acute on chronic diastolic congestive heart failure exacerbation, mild Congestive heart failure protocol, IV Lasix twice daily, beta-blocker therapy, Crestor, strict I&O monitoring, daily weights, continue close medical monitoring Most recent echocardiogram from April 2019 noted for ejection fraction of 60 to 65%  *Chronic benign essential hypertension Hold Norvasc, continue other antihypertensive agents, IV hydralazine as needed systolic blood pressure greater than 160, make changes as per necessary  *Acute renal failure with hyponatremia and hyperkalemia on chronic kidney disease stage IV with nephrotic range proteinuria: - likely cardiorenal syndrome - Nephro to decide on HD  * Chronic diabetes mellitus type 2 - Continue home regimen, sliding scale insulin with Accu-Cheks per routine - lantus 15 units qhs due to being on steroids  *  Morbid Obesity - counseled,  PCP f/up    All the records are reviewed and case discussed with Care Management/Social Worker. Management plans discussed with the patient, family (daughter at bedside) and they are in agreement.  CODE STATUS: Full Code  TOTAL TIME TAKING CARE OF THIS PATIENT: 35 minutes.   More than 50% of the time was spent in counseling/coordination of care: YES  POSSIBLE D/C IN 2-3 DAYS, DEPENDING ON CLINICAL CONDITION.   Delfino LovettVipul Riggin Cuttino M.D on 08/12/2018 at 3:35 PM  Between 7am to 6pm - Pager - [phone]  After 6pm go to [email or web address] - password [email or web address] [facility]  [facility] [place] [facility]  Office  [phone]  CC: Primary care physician; Particia NearingLane, Rachel Elizabeth, PA-C  Note: This dictation was prepared with Dragon dictation along with smaller phrase technology. Any transcriptional errors that result from this process are unintentional.

## 2018-08-12 NOTE — Progress Notes (Signed)
[facility]  ROUNDING NOTE   Subjective:   Daughter at bedside.   Creatinine 3.91 (3.47)  UOP 650  Objective:  Vital signs in last 24 hours:  Temp:  [97.4 F (36.3 C)-97.7 F (36.5 C)] 97.4 F (36.3 C) (12/11 0743) Pulse Rate:  [59-66] 62 (12/11 0743) Resp:  [19-20] 20 (12/11 0743) BP: (108-121)/(45-59) 108/59 (12/11 0743) SpO2:  [91 %-98 %] 93 % (12/11 1338) FiO2 (%):  [32 %] 32 % (12/11 0534) Weight:  [103.5 kg] 103.5 kg (12/11 0457)  Weight change: 6.884 kg Filed Weights   08/10/18 1323 08/11/18 0612 08/12/18 0457  Weight: 96.6 kg 98.8 kg 103.5 kg    Intake/Output: I/O last 3 completed shifts: In: 3 [I.V.:3] Out: 800 [Urine:800]   Intake/Output this shift:  Total I/O In: 120 [P.O.:120] Out: -   Physical Exam: General: NAD,   Head: Normocephalic, atraumatic. Moist oral mucosal membranes  Eyes: Anicteric, PERRL  Neck: Supple, trachea midline  Lungs:  Clear to auscultation  Heart: Regular rate and rhythm  Abdomen:  Soft, nontender,   Extremities: no peripheral edema.  Neurologic: Nonfocal, moving all four extremities  Skin: No lesions        Basic Metabolic Panel: Recent Labs  Lab 08/10/18 1348 08/11/18 0555 08/12/18 0320  NA 137 134* 133*  K 4.4 5.6* 5.9*  CL 101 100 98  CO2 29 27 27   GLUCOSE 52* 258* 147*  BUN 59* 61* 71*  CREATININE 3.49* 3.47* 3.91*  CALCIUM 7.7* 7.4* 7.7*    Liver Function Tests: No results for input(s): AST, ALT, ALKPHOS, BILITOT, PROT, ALBUMIN in the last 168 hours. No results for input(s): LIPASE, AMYLASE in the last 168 hours. No results for input(s): AMMONIA in the last 168 hours.  CBC: Recent Labs  Lab 08/10/18 1348 08/11/18 0736 08/12/18 0320  WBC 3.7* 3.9* 4.8  HGB 8.5* 8.0* 7.8*  HCT 26.4* 25.3* 25.6*  MCV 121.1* 123.4* 124.9*  PLT 218 184 185    Cardiac Enzymes: Recent Labs  Lab 08/10/18 1348 08/10/18 1909 08/10/18 2345 08/11/18 0555  TROPONINI 0.03* <0.03 0.03* <0.03     BNP: Invalid input(s): POCBNP  CBG: Recent Labs  Lab 08/11/18 1144 08/11/18 1716 08/11/18 2113 08/12/18 0805 08/12/18 1152  GLUCAP 254* 179* 123* 146* 178*    Microbiology: No results found for this or any previous visit.  Coagulation Studies: No results for input(s): LABPROT, INR in the last 72 hours.  Urinalysis: No results for input(s): COLORURINE, LABSPEC, PHURINE, GLUCOSEU, HGBUR, BILIRUBINUR, KETONESUR, PROTEINUR, UROBILINOGEN, NITRITE, LEUKOCYTESUR in the last 72 hours.  Invalid input(s): APPERANCEUR    Imaging: No results found.   Medications:   . sodium chloride     . budesonide (PULMICORT) nebulizer solution  0.5 mg Nebulization BID  . carvedilol  12.5 mg Oral BID WC  . [START ON 08/16/2018] cloNIDine  0.3 mg Transdermal Weekly  . furosemide  20 mg Intravenous BID  . heparin  5,000 Units Subcutaneous Q8H  . insulin aspart  0-20 Units Subcutaneous TID WC  . insulin aspart  0-5 Units Subcutaneous QHS  . insulin glargine  15 Units Subcutaneous QHS  . ipratropium-albuterol  3 mL Nebulization Q6H  . methylPREDNISolone (SOLU-MEDROL) injection  60 mg Intravenous Q8H  . pantoprazole  40 mg Oral Daily  . rosuvastatin  5 mg Oral QODAY  . sodium chloride flush  3 mL Intravenous Q12H  . sodium zirconium cyclosilicate  10 g Oral Daily   sodium chloride, acetaminophen, alum &  mag hydroxide-simeth, benzonatate, guaiFENesin, hydrALAZINE, ondansetron (ZOFRAN) IV, ondansetron, phenol, sodium chloride flush  Assessment/ Plan:  Ms. Cheri Fowlerrma W Whitesel is a 84 y.o. black female with diabetes mellitus type II, aortic stenosis, hypertension, hypercholesterolemia who [facility] for Weakness [R53.1] Acute respiratory failure with hypoxia (HCC) [J96.01] AKI (acute kidney injury) (HCC) [N17.9] Anemia, unspecified type [D64.9]  1. Acute renal failure with hyponatremia and hyperkalemia on chronic kidney disease stage IV with nephrotic range proteinuria:  baseline creatinine of 1.85, GFR of 29 on 03/26/18.  Chronic kidney disease secondary to diabetic nephropathy Acute renal failure secondary to acute cardiorenal syndrome from acute diastolic congestive heart failure.  Discussed dialysis with patient.  - IV furosemide - Lokelma   2. Hypertension with aortic stenosis: new echocardiogram with diastolic congestive heart failure. Echocardiogram on 4/19 shows preserved systolic function - IV furosemide 20mg  IV.   3. Acute exacerbation of COPD - systemic steroids.  - O2  - supportive care.    LOS: 2 Murry Diaz 12/11/20194:32 PM

## 2018-08-13 LAB — BASIC METABOLIC PANEL
Anion gap: 12 (ref 5–15)
BUN: 82 mg/dL — ABNORMAL HIGH (ref 8–23)
CO2: 22 mmol/L (ref 22–32)
Calcium: 7.5 mg/dL — ABNORMAL LOW (ref 8.9–10.3)
Chloride: 95 mmol/L — ABNORMAL LOW (ref 98–111)
Creatinine, Ser: 4.83 mg/dL — ABNORMAL HIGH (ref 0.44–1.00)
GFR calc Af Amer: 9 mL/min — ABNORMAL LOW (ref >60)
GFR calc non Af Amer: 8 mL/min — ABNORMAL LOW (ref >60)
Glucose, Bld: 183 mg/dL — ABNORMAL HIGH (ref 70–99)
Potassium: 5.6 mmol/L — ABNORMAL HIGH (ref 3.5–5.1)
SODIUM: 129 mmol/L — AB (ref 135–145)

## 2018-08-13 LAB — GLUCOSE, CAPILLARY
Glucose-Capillary: 172 mg/dL — ABNORMAL HIGH (ref 70–99)
Glucose-Capillary: 200 mg/dL — ABNORMAL HIGH (ref 70–99)
Glucose-Capillary: 153 mg/dL — ABNORMAL HIGH (ref 70–99)
Glucose-Capillary: 154 mg/dL — ABNORMAL HIGH (ref 70–99)

## 2018-08-13 MED ORDER — PREDNISONE 50 MG PO TABS
50.0000 mg | ORAL_TABLET | Freq: Every day | ORAL | Status: DC
  Administered 2018-08-14: 50 mg via ORAL
  Filled 2018-08-13: qty 1

## 2018-08-13 MED ORDER — FUROSEMIDE 40 MG PO TABS
40.0000 mg | ORAL_TABLET | Freq: Every day | ORAL | Status: DC
  Administered 2018-08-14: 40 mg via ORAL
  Filled 2018-08-13: qty 1

## 2018-08-13 MED ORDER — METHYLPREDNISOLONE SODIUM SUCC 40 MG IJ SOLR: 40.0000 mg | Freq: Two times a day (BID) | INTRAMUSCULAR | Status: DC

## 2018-08-13 NOTE — Progress Notes (Signed)
[employer] - [place] at [facility]   PATIENT NAME: Patty Roberts    MR#:  [id number]  DATE OF BIRTH:  10/31/1931  SUBJECTIVE:  CHIEF COMPLAINT:   Chief Complaint  Patient presents with  . Shortness of Breath  The patient feels better, on oxygen 3 L by nasal cannula. REVIEW OF SYSTEMS:  Review of Systems  Constitutional: Negative for chills, fever and weight loss.  HENT: Negative for nosebleeds and sore throat.   Eyes: Negative for blurred vision.  Respiratory: Positive for shortness of breath. Negative for cough and wheezing.   Cardiovascular: Negative for chest pain, orthopnea, leg swelling and PND.  Gastrointestinal: Negative for abdominal pain, constipation, diarrhea, heartburn, nausea and vomiting.  Genitourinary: Negative for dysuria and urgency.  Musculoskeletal: Negative for back pain.  Skin: Negative for rash.  Neurological: Negative for dizziness, speech change, focal weakness and headaches.  Endo/Heme/Allergies: Does not bruise/bleed easily.  Psychiatric/Behavioral: Negative for depression.   DRUG ALLERGIES:   Allergies  Allergen Reactions  . Phenameth [Promethazine]   . Statins Other (See Comments)    Leg pain    VITALS:  Blood pressure (!) 115/44, pulse (!) 54, temperature 98.4 F (36.9 C), temperature source Oral, resp. rate 18, height 5\' 4"  (1.626 m), weight 103.3 kg, SpO2 98 %. PHYSICAL EXAMINATION:  Physical Exam HENT:     Head: Normocephalic and atraumatic.  Eyes:     Conjunctiva/sclera: Conjunctivae normal.     Pupils: Pupils are equal, round, and reactive to light.  Neck:     Musculoskeletal: Normal range of motion and neck supple.     Thyroid: No thyromegaly.     Trachea: No tracheal deviation.  Cardiovascular:     Rate and Rhythm: Normal rate and regular rhythm.     Heart sounds: Normal heart sounds.  Pulmonary:     Effort: Pulmonary effort is normal. No respiratory distress.     Breath sounds: Normal breath sounds. No  wheezing.  Chest:     Chest wall: No tenderness.  Abdominal:     General: Bowel sounds are normal. There is no distension.     Palpations: Abdomen is soft.     Tenderness: There is no abdominal tenderness.  Musculoskeletal: Normal range of motion.  Skin:    General: Skin is warm and dry.     Findings: No rash.  Neurological:     Mental Status: She is alert and oriented to person, place, and time.     Cranial Nerves: No cranial nerve deficit.    LABORATORY PANEL:  Female CBC Recent Labs  Lab 08/12/18 0320  WBC 4.8  HGB 7.8*  HCT 25.6*  PLT 185   ------------------------------------------------------------------------------------------------------------------ Chemistries  Recent Labs  Lab 08/13/18 0352  NA 129*  K 5.6*  CL 95*  CO2 22  GLUCOSE 183*  BUN 82*  CREATININE 4.83*  CALCIUM 7.5*   RADIOLOGY:  No results found. ASSESSMENT AND PLAN:   *Acute on chronic hypoxic respiratory failure - likely multifactorial process that includes congestive heart failure and COPD exacerbations Continue oxygen by nasal cannula, DuoNeb as needed.  Mucinex as needed.  *Acute COPD exacerbation Taper IV steroid, mucolytic agents, inhaled corticosteroids twice daily, scheduled breathing treatments, respiratory therapy.  *Acute on chronic diastolic congestive heart failure exacerbation,  She was on IV Lasix twice daily, change to p.o. Lasix 40 mg daily per Dr. Wynelle LinkKolluru.   Continue beta-blocker therapy, Crestor, strict I&O monitoring, daily weights, continue close medical monitoring Most recent echocardiogram from  April 2019 noted for ejection fraction of 60 to 65%  *Chronic benign essential hypertension Hold Norvasc, continue other antihypertensive agents, IV hydralazine as needed systolic blood pressure greater than 160, make changes as per necessary  *Acute renal failure with hyponatremia and hyperkalemia on chronic kidney disease stage IV with nephrotic range proteinuria: -  likely cardiorenal syndrome Worsening renal function and hyponatremia. Changed to p.o. Lasix and follow-up BMP per Dr. Wynelle Link. Continue Lokelma.  * Chronic diabetes mellitus type 2 - Continue sliding scale insulin with Accu-Cheks per routine - lantus 15 units qhs due to being on steroids  * Morbid Obesity.  Possible OSA. CPAP at night. - counseled, PCP f/up  I discussed with Dr. Wynelle Link. All the records are reviewed and case discussed with Care Management/Social Worker. Management plans discussed with the patient, her daughter and they are in agreement.  CODE STATUS: Full Code  TOTAL TIME TAKING CARE OF THIS PATIENT: 35 minutes.   More than 50% of the time was spent in counseling/coordination of care: YES  POSSIBLE D/C IN 2-3 DAYS, DEPENDING ON CLINICAL CONDITION.   Shaune Pollack M.D on 08/13/2018 at 5:27 PM  Between 7am to 6pm - Pager - [phone]  After 6pm go to [email or web address] - password [email or web address] [facility]  [employer] [facility] [place] [facility]  Office  [phone]  CC: Primary care physician; Particia Nearing, PA-C  Note: This dictation was prepared with Dragon dictation along with smaller phrase technology. Any transcriptional errors that result from this process are unintentional.

## 2018-08-13 NOTE — Progress Notes (Signed)
Pt has not had any output during this shift, bladder scanned pt yielded 13 cc, Dr. Imogene Burnhen notified no new orders received. Will continue to monitor.

## 2018-08-13 NOTE — Progress Notes (Addendum)
Pt assessed for midline catheter/PIV. Both arms bruised and swollen. Attempted PIV with ultrasound unable to thread. A  Thorough search of both arms revealed nothing for this RN to insert midline catheter. RN aware of no I.V. access and stated nothing due by I.V. tonight.

## 2018-08-13 NOTE — Care Management Important Message (Signed)
Copy of signed IM left with patient in room.  

## 2018-08-13 NOTE — Plan of Care (Signed)
  Problem: Education: Goal: Knowledge of General Education information will improve Description: Including pain rating scale, medication(s)/side effects and non-pharmacologic comfort measures Outcome: Progressing   Problem: Health Behavior/Discharge Planning: Goal: Ability to manage health-related needs will improve Outcome: Progressing   Problem: Education: Goal: Ability to demonstrate management of disease process will improve Outcome: Progressing Goal: Ability to verbalize understanding of medication therapies will improve Outcome: Progressing   

## 2018-08-13 NOTE — Progress Notes (Signed)
[facility]  ROUNDING NOTE   Subjective:   Daughter at bedside.   Patient states she is starting to feel better.   UOP 850  Objective:  Vital signs in last 24 hours:  Temp:  [97.9 F (36.6 C)] 97.9 F (36.6 C) (12/12 0425) Pulse Rate:  [54-62] 57 (12/12 0739) Resp:  [18-19] 18 (12/12 0739) BP: (92-110)/(45-76) 95/45 (12/12 0739) SpO2:  [90 %-100 %] 100 % (12/12 0739) Weight:  [103.3 kg] 103.3 kg (12/12 0454)  Weight change: -0.2 kg Filed Weights   08/11/18 0612 08/12/18 0457 08/13/18 0454  Weight: 98.8 kg 103.5 kg 103.3 kg    Intake/Output: I/O last 3 completed shifts: In: 120 [P.O.:120] Out: 850 [Urine:850]   Intake/Output this shift:  Total I/O In: 360 [P.O.:360] Out: 0   Physical Exam: General: NAD,   Head: Normocephalic, atraumatic. Moist oral mucosal membranes  Eyes: Anicteric, PERRL  Neck: Supple, trachea midline  Lungs:  Clear to auscultation  Heart: Regular rate and rhythm  Abdomen:  Soft, nontender,   Extremities: no peripheral edema.  Neurologic: Nonfocal, moving all four extremities  Skin: No lesions        Basic Metabolic Panel: Recent Labs  Lab 08/10/18 1348 08/11/18 0555 08/12/18 0320 08/13/18 0352  NA 137 134* 133* 129*  K 4.4 5.6* 5.9* 5.6*  CL 101 100 98 95*  CO2 29 27 27 22   GLUCOSE 52* 258* 147* 183*  BUN 59* 61* 71* 82*  CREATININE 3.49* 3.47* 3.91* 4.83*  CALCIUM 7.7* 7.4* 7.7* 7.5*    Liver Function Tests: No results for input(s): AST, ALT, ALKPHOS, BILITOT, PROT, ALBUMIN in the last 168 hours. No results for input(s): LIPASE, AMYLASE in the last 168 hours. No results for input(s): AMMONIA in the last 168 hours.  CBC: Recent Labs  Lab 08/10/18 1348 08/11/18 0736 08/12/18 0320  WBC 3.7* 3.9* 4.8  HGB 8.5* 8.0* 7.8*  HCT 26.4* 25.3* 25.6*  MCV 121.1* 123.4* 124.9*  PLT 218 184 185    Cardiac Enzymes: Recent Labs  Lab 08/10/18 1348 08/10/18 1909 08/10/18 2345 08/11/18 0555  TROPONINI 0.03*  <0.03 0.03* <0.03    BNP: Invalid input(s): POCBNP  CBG: Recent Labs  Lab 08/12/18 1152 08/12/18 1636 08/12/18 2119 08/13/18 0735 08/13/18 1144  GLUCAP 178* 188* 208* 172* 200*    Microbiology: No results found for this or any previous visit.  Coagulation Studies: No results for input(s): LABPROT, INR in the last 72 hours.  Urinalysis: No results for input(s): COLORURINE, LABSPEC, PHURINE, GLUCOSEU, HGBUR, BILIRUBINUR, KETONESUR, PROTEINUR, UROBILINOGEN, NITRITE, LEUKOCYTESUR in the last 72 hours.  Invalid input(s): APPERANCEUR    Imaging: No results found.   Medications:   . sodium chloride     . budesonide (PULMICORT) nebulizer solution  0.5 mg Nebulization BID  . carvedilol  12.5 mg Oral BID WC  . [START ON 08/16/2018] cloNIDine  0.3 mg Transdermal Weekly  . furosemide  40 mg Oral Daily  . heparin  5,000 Units Subcutaneous Q8H  . insulin aspart  0-20 Units Subcutaneous TID WC  . insulin aspart  0-5 Units Subcutaneous QHS  . insulin glargine  15 Units Subcutaneous QHS  . ipratropium-albuterol  3 mL Nebulization Q6H  . methylPREDNISolone (SOLU-MEDROL) injection  60 mg Intravenous Q8H  . pantoprazole  40 mg Oral Daily  . rosuvastatin  5 mg Oral QODAY  . sodium chloride flush  3 mL Intravenous Q12H  . sodium zirconium cyclosilicate  10 g Oral Daily   sodium  chloride, acetaminophen, alum & mag hydroxide-simeth, benzonatate, guaiFENesin, hydrALAZINE, ondansetron (ZOFRAN) IV, ondansetron, phenol, sodium chloride flush  Assessment/ Plan:  Patty Roberts is a 84 y.o. black female with diabetes mellitus type II, aortic stenosis, hypertension, hypercholesterolemia who was admitted to [facility] on 08/10/2018 for Weakness [R53.1] Acute respiratory failure with hypoxia (HCC) [J96.01] AKI (acute kidney injury) (HCC) [N17.9] Anemia, unspecified type [D64.9]  1. Acute renal failure with hyponatremia and hyperkalemia on chronic kidney disease stage IV with nephrotic range  proteinuria: baseline creatinine of 1.85, GFR of 29 on 03/26/18.  Chronic kidney disease secondary to diabetic nephropathy Acute renal failure secondary to acute cardiorenal syndrome from acute diastolic congestive heart failure.  Discussed dialysis with patient. No indication for dialysis at this time.  - change to PO furosemide.  - Lokelma   2. Hypertension with aortic stenosis: new echocardiogram with diastolic congestive heart failure. Echocardiogram on 4/19 shows preserved systolic function Furosemide as above.   3. Acute exacerbation of COPD - systemic steroids.  - O2  - supportive care.    LOS: 3 Patty Roberts 12/12/20192:51 PM

## 2018-08-14 LAB — BASIC METABOLIC PANEL
Anion gap: 12 (ref 5–15)
BUN: 95 mg/dL — AB (ref 8–23)
CO2: 25 mmol/L (ref 22–32)
Calcium: 7.3 mg/dL — ABNORMAL LOW (ref 8.9–10.3)
Chloride: 92 mmol/L — ABNORMAL LOW (ref 98–111)
Creatinine, Ser: 5.34 mg/dL — ABNORMAL HIGH (ref 0.44–1.00)
GFR calc Af Amer: 8 mL/min — ABNORMAL LOW (ref >60)
GFR calc non Af Amer: 7 mL/min — ABNORMAL LOW (ref >60)
Glucose, Bld: 127 mg/dL — ABNORMAL HIGH (ref 70–99)
POTASSIUM: 4.9 mmol/L (ref 3.5–5.1)
Sodium: 129 mmol/L — ABNORMAL LOW (ref 135–145)

## 2018-08-14 LAB — TYPE AND SCREEN
ABO/RH(D): B POS
Antibody Screen: NEGATIVE
Unit division: 0

## 2018-08-14 LAB — BPAM RBC
Blood Product Expiration Date: 201912302359
Unit Type and Rh: 7300

## 2018-08-14 LAB — PREPARE RBC (CROSSMATCH)

## 2018-08-14 LAB — GLUCOSE, CAPILLARY
Glucose-Capillary: 83 mg/dL (ref 70–99)
Glucose-Capillary: 81 mg/dL (ref 70–99)
Glucose-Capillary: 74 mg/dL (ref 70–99)
Glucose-Capillary: 91 mg/dL (ref 70–99)

## 2018-08-14 LAB — HEMOGLOBIN: Hemoglobin: 7.6 g/dL — ABNORMAL LOW (ref 12.0–15.0)

## 2018-08-14 MED ORDER — SENNA 8.6 MG PO TABS: 1.0000 | ORAL_TABLET | Freq: Every day | ORAL | Status: DC | PRN

## 2018-08-14 MED ORDER — INSULIN GLARGINE 100 UNIT/ML ~~LOC~~ SOLN
8.0000 [IU] | Freq: Every day | SUBCUTANEOUS | Status: DC
  Filled 2018-08-14: qty 0.08

## 2018-08-14 MED ORDER — BISACODYL 5 MG PO TBEC
5.0000 mg | DELAYED_RELEASE_TABLET | Freq: Every day | ORAL | Status: DC | PRN
  Administered 2018-08-14: 5 mg via ORAL
  Filled 2018-08-14: qty 1

## 2018-08-14 MED ORDER — DOCUSATE SODIUM 100 MG PO CAPS
100.0000 mg | ORAL_CAPSULE | Freq: Two times a day (BID) | ORAL | Status: DC
  Administered 2018-08-15 – 2018-08-18 (×7): 100 mg via ORAL
  Filled 2018-08-14 (×8): qty 1

## 2018-08-14 MED ORDER — PREDNISONE 20 MG PO TABS
40.0000 mg | ORAL_TABLET | Freq: Every day | ORAL | Status: DC
  Administered 2018-08-15 – 2018-08-18 (×4): 40 mg via ORAL
  Filled 2018-08-14 (×4): qty 2

## 2018-08-14 NOTE — Progress Notes (Signed)
Inpatient Diabetes Program Recommendations  AACE/ADA: New Consensus Statement on Inpatient Glycemic Control (2015)  Target Ranges:  Prepandial:   less than 140 mg/dL      Peak postprandial:   less than 180 mg/dL (1-2 hours)      Critically ill patients:  140 - 180 mg/dL   Results for Patty FowlerBUSH, Yani W (MRN [id number]) as of 08/14/2018 13:07  Ref. Range 08/14/2018 08:14 08/14/2018 11:43  Glucose-Capillary Latest Ref Range: 70 - 99 mg/dL 83 81    Home DM Meds: Glipizide 5 mg BID  Current Orders: Lantus 15 units QHS      Novolog Resistant Correction Scale/ SSI (0-20 units) TID AC + HS       Solumedrol stopped--Last dose given yest at 2pm.  Now getting Prednisone 50 mg Daily.  CBGs only in the 80s range today.     MD- Please consider reducing Lantus to 8 units QHS (half the current dose) now that Solumedrol stopped.    --Will follow patient during hospitalization--  Ambrose FinlandJeannine Johnston Shai Mckenzie RN, MSN, CDE Diabetes Coordinator Inpatient Glycemic Control Team Team Pager: [phone] (8a-5p)

## 2018-08-14 NOTE — Plan of Care (Signed)
  Problem: Activity: Goal: Risk for activity intolerance will decrease Outcome: Progressing   Problem: Activity: Goal: Capacity to carry out activities will improve Outcome: Progressing   

## 2018-08-14 NOTE — Progress Notes (Signed)
[employer] - [place] at [facility]   PATIENT NAME: Blima Jaimes    MR#:  [id number]  DATE OF BIRTH:  [birth year]  SUBJECTIVE:  CHIEF COMPLAINT:   Chief Complaint  Patient presents with  . Shortness of Breath  The patient has generalized weakness, on oxygen 3 L by nasal cannula. REVIEW OF SYSTEMS:  Review of Systems  Constitutional: Positive for malaise/fatigue. Negative for chills, fever and weight loss.  HENT: Negative for nosebleeds and sore throat.   Eyes: Negative for blurred vision.  Respiratory: Negative for cough, shortness of breath and wheezing.   Cardiovascular: Negative for chest pain, orthopnea, leg swelling and PND.  Gastrointestinal: Negative for abdominal pain, constipation, diarrhea, heartburn, nausea and vomiting.  Genitourinary: Negative for dysuria and urgency.  Musculoskeletal: Negative for back pain.  Skin: Negative for rash.  Neurological: Negative for dizziness, speech change, focal weakness and headaches.  Endo/Heme/Allergies: Does not bruise/bleed easily.  Psychiatric/Behavioral: Negative for depression.   DRUG ALLERGIES:   Allergies  Allergen Reactions  . Phenameth [Promethazine]   . Statins Other (See Comments)    Leg pain    VITALS:  Blood pressure (!) 128/33, pulse (!) 55, temperature (!) 97.4 F (36.3 C), temperature source Oral, resp. rate 18, height 5\' 4"  (1.626 m), weight 102.4 kg, SpO2 98 %. PHYSICAL EXAMINATION:  Physical Exam HENT:     Head: Normocephalic and atraumatic.  Eyes:     Conjunctiva/sclera: Conjunctivae normal.     Pupils: Pupils are equal, round, and reactive to light.  Neck:     Musculoskeletal: Normal range of motion and neck supple.     Thyroid: No thyromegaly.     Trachea: No tracheal deviation.  Cardiovascular:     Rate and Rhythm: Normal rate and regular rhythm.     Heart sounds: Normal heart sounds.  Pulmonary:     Effort: Pulmonary effort is normal. No respiratory distress.     Breath  sounds: Normal breath sounds. No wheezing.  Chest:     Chest wall: No tenderness.  Abdominal:     General: Bowel sounds are normal. There is no distension.     Palpations: Abdomen is soft.     Tenderness: There is no abdominal tenderness.  Musculoskeletal: Normal range of motion.  Skin:    General: Skin is warm and dry.     Findings: No rash.  Neurological:     Mental Status: She is alert and oriented to person, place, and time.     Cranial Nerves: No cranial nerve deficit.    LABORATORY PANEL:  Female CBC Recent Labs  Lab 08/12/18 0320 08/14/18 0446  WBC 4.8  --   HGB 7.8* 7.6*  HCT 25.6*  --   PLT 185  --    ------------------------------------------------------------------------------------------------------------------ Chemistries  Recent Labs  Lab 08/14/18 0446  NA 129*  K 4.9  CL 92*  CO2 25  GLUCOSE 127*  BUN 95*  CREATININE 5.34*  CALCIUM 7.3*   RADIOLOGY:  No results found. ASSESSMENT AND PLAN:   *Acute on chronic hypoxic respiratory failure - likely multifactorial process that includes congestive heart failure and COPD exacerbations Continue oxygen by nasal cannula, DuoNeb as needed.  Mucinex as needed.  *Acute COPD exacerbation Changed po prednisone, continue mucolytic agents, inhaled corticosteroids twice daily, scheduled breathing treatments, respiratory therapy.  *Acute on chronic diastolic congestive heart failure exacerbation,  She was on IV Lasix twice daily, changed to p.o. Lasix 40 mg daily, hold due to worsening renal function  per Dr. Wynelle LinkKolluru.   Continue beta-blocker therapy, Crestor, strict I&O monitoring, daily weights, continue close medical monitoring Most recent echocardiogram from April 2019 noted for ejection fraction of 60 to 65%  *Chronic benign essential hypertension Hold Norvasc, hold coreg due to low side BP.  *Acute renal failure with hyponatremia and hyperkalemia on chronic kidney disease stage IV with nephrotic range  proteinuria: - likely cardiorenal syndrome Worsening renal function and hyponatremia. hold p.o. Lasix and follow-up BMP and no indication for HD per Dr. Wynelle LinkKolluru. Continue Lokelma.  * Chronic diabetes mellitus type 2 - Continue sliding scale insulin with Accu-Cheks per routine decrease lantus 8 units qhs.  * Morbid Obesity.  Possible OSA. CPAP at night.  - counseled, PCP f/up  Anemia of chronic disease. Hb is low but stable.  I discussed with Dr. Wynelle LinkKolluru. All the records are reviewed and case discussed with Care Management/Social Worker. Management plans discussed with the patient, her daughter and they are in agreement.  CODE STATUS: Full Code  TOTAL TIME TAKING CARE OF THIS PATIENT: 28 minutes.   More than 50% of the time was spent in counseling/coordination of care: YES  POSSIBLE D/C IN 2-3 DAYS, DEPENDING ON CLINICAL CONDITION.   Shaune PollackQing Prue Lingenfelter M.D on 08/14/2018 at 4:55 PM  Between 7am to 6pm - Pager - [phone]  After 6pm go to [email or web address] - password [email or web address] [facility]  [facility] [place] [facility]  Office  [phone]  CC: Primary care physician; Particia NearingLane, Rachel Elizabeth, PA-C  Note: This dictation was prepared with Dragon dictation along with smaller phrase technology. Any transcriptional errors that result from this process are unintentional.

## 2018-08-14 NOTE — Plan of Care (Signed)
  Problem: Education: Goal: Knowledge of General Education information will improve Description Including pain rating scale, medication(s)/side effects and non-pharmacologic comfort measures Outcome: Progressing   Problem: Health Behavior/Discharge Planning: Goal: Ability to manage health-related needs will improve Outcome: Progressing   Problem: Activity: Goal: Risk for activity intolerance will decrease Outcome: Progressing   Problem: Education: Goal: Ability to demonstrate management of disease process will improve Outcome: Progressing   

## 2018-08-14 NOTE — Progress Notes (Signed)
[facility]  ROUNDING NOTE   Subjective:   Daughter at bedside.   Patient states she is starting to feel better.   Sitting in chair. Wants to go home.   Objective:  Vital signs in last 24 hours:  Temp:  [97.3 F (36.3 C)-98.4 F (36.9 C)] 97.4 F (36.3 C) (12/13 0817) Pulse Rate:  [49-57] 49 (12/13 0817) Resp:  [18-20] 20 (12/13 0817) BP: (100-123)/(41-72) 100/41 (12/13 0817) SpO2:  [92 %-100 %] 94 % (12/13 0817) FiO2 (%):  [28 %] 28 % (12/13 0143) Weight:  [102.4 kg] 102.4 kg (12/13 0446)  Weight change: -0.9 kg Filed Weights   08/12/18 0457 08/13/18 0454 08/14/18 0446  Weight: 103.5 kg 103.3 kg 102.4 kg    Intake/Output: I/O last 3 completed shifts: In: 360 [P.O.:360] Out: 150 [Urine:150]   Intake/Output this shift:  No intake/output data recorded.  Physical Exam: General: NAD,   Head: Normocephalic, atraumatic. Moist oral mucosal membranes  Eyes: Anicteric, PERRL  Neck: Supple, trachea midline  Lungs:  Clear to auscultation, 2 L [place]  Heart: Regular rate and rhythm  Abdomen:  Soft, nontender,   Extremities: no peripheral edema.  Neurologic: Nonfocal, moving all four extremities  Skin: No lesions        Basic Metabolic Panel: Recent Labs  Lab 08/10/18 1348 08/11/18 0555 08/12/18 0320 08/13/18 0352 08/14/18 0446  NA 137 134* 133* 129* 129*  K 4.4 5.6* 5.9* 5.6* 4.9  CL 101 100 98 95* 92*  CO2 29 27 27 22 25   GLUCOSE 52* 258* 147* 183* 127*  BUN 59* 61* 71* 82* 95*  CREATININE 3.49* 3.47* 3.91* 4.83* 5.34*  CALCIUM 7.7* 7.4* 7.7* 7.5* 7.3*    Liver Function Tests: No results for input(s): AST, ALT, ALKPHOS, BILITOT, PROT, ALBUMIN in the last 168 hours. No results for input(s): LIPASE, AMYLASE in the last 168 hours. No results for input(s): AMMONIA in the last 168 hours.  CBC: Recent Labs  Lab 08/10/18 1348 08/11/18 0736 08/12/18 0320 08/14/18 0446  WBC 3.7* 3.9* 4.8  --   HGB 8.5* 8.0* 7.8* 7.6*  HCT 26.4* 25.3* 25.6*  --    MCV 121.1* 123.4* 124.9*  --   PLT 218 184 185  --     Cardiac Enzymes: Recent Labs  Lab 08/10/18 1348 08/10/18 1909 08/10/18 2345 08/11/18 0555  TROPONINI 0.03* <0.03 0.03* <0.03    BNP: Invalid input(s): POCBNP  CBG: Recent Labs  Lab 08/13/18 0735 08/13/18 1144 08/13/18 1657 08/13/18 2043 08/14/18 0814  GLUCAP 172* 200* 153* 154* 83    Microbiology: No results found for this or any previous visit.  Coagulation Studies: No results for input(s): LABPROT, INR in the last 72 hours.  Urinalysis: No results for input(s): COLORURINE, LABSPEC, PHURINE, GLUCOSEU, HGBUR, BILIRUBINUR, KETONESUR, PROTEINUR, UROBILINOGEN, NITRITE, LEUKOCYTESUR in the last 72 hours.  Invalid input(s): APPERANCEUR    Imaging: No results found.   Medications:   . sodium chloride     . budesonide (PULMICORT) nebulizer solution  0.5 mg Nebulization BID  . carvedilol  12.5 mg Oral BID WC  . [START ON 08/16/2018] cloNIDine  0.3 mg Transdermal Weekly  . heparin  5,000 Units Subcutaneous Q8H  . insulin aspart  0-20 Units Subcutaneous TID WC  . insulin aspart  0-5 Units Subcutaneous QHS  . insulin glargine  15 Units Subcutaneous QHS  . ipratropium-albuterol  3 mL Nebulization Q6H  . pantoprazole  40 mg Oral Daily  . predniSONE  50 mg Oral  Q breakfast  . rosuvastatin  5 mg Oral QODAY  . sodium chloride flush  3 mL Intravenous Q12H  . sodium zirconium cyclosilicate  10 g Oral Daily   sodium chloride, acetaminophen, alum & mag hydroxide-simeth, benzonatate, guaiFENesin, hydrALAZINE, ondansetron (ZOFRAN) IV, ondansetron, phenol, sodium chloride flush  Assessment/ Plan:  [name] is a 84 y.o. black female with diabetes mellitus type II, aortic stenosis, hypertension, hypercholesterolemia who [facility] for Weakness [R53.1] Acute respiratory failure with hypoxia (HCC) [J96.01] AKI (acute kidney injury) (HCC) [N17.9] Anemia, unspecified type [D64.9]  1. Acute  renal failure with hyponatremia and hyperkalemia on chronic kidney disease stage IV with nephrotic range proteinuria: baseline creatinine of 1.85, GFR of 29 on 03/26/18.  Chronic kidney disease secondary to diabetic nephropathy Acute renal failure secondary to acute cardiorenal syndrome from acute diastolic congestive heart failure.  Discussed dialysis with patient. No indication for dialysis at this time.  - hold furosmide.  - Lokelma  - discontinue fluid restriction  2. Hypertension with aortic stenosis: new echocardiogram with diastolic congestive heart failure. Echocardiogram on 4/19 shows preserved systolic function Furosemide as above.   3. Acute exacerbation of COPD - systemic steroids.  - O2  - supportive care.    LOS: 4 Patty Roberts 12/13/201910:34 AM

## 2018-08-14 NOTE — Progress Notes (Signed)
Pt has no IV access,  IV team has been unsuccessful, Dr. Imogene Burnhen aware.

## 2018-08-14 NOTE — Progress Notes (Signed)
Called by RN about pt not tolerating CPAP. Pt was removed from CPAP and placed back on nasal cannula. Pt family at bedside.

## 2018-08-15 LAB — BASIC METABOLIC PANEL
Anion gap: 10 (ref 5–15)
BUN: 98 mg/dL — ABNORMAL HIGH (ref 8–23)
CO2: 26 mmol/L (ref 22–32)
Calcium: 7.4 mg/dL — ABNORMAL LOW (ref 8.9–10.3)
Chloride: 92 mmol/L — ABNORMAL LOW (ref 98–111)
Creatinine, Ser: 4.98 mg/dL — ABNORMAL HIGH (ref 0.44–1.00)
GFR calc Af Amer: 9 mL/min — ABNORMAL LOW (ref >60)
GFR calc non Af Amer: 7 mL/min — ABNORMAL LOW (ref >60)
Glucose, Bld: 121 mg/dL — ABNORMAL HIGH (ref 70–99)
Potassium: 5.2 mmol/L — ABNORMAL HIGH (ref 3.5–5.1)
SODIUM: 128 mmol/L — AB (ref 135–145)

## 2018-08-15 LAB — OCCULT BLOOD X 1 CARD TO LAB, STOOL: FECAL OCCULT BLD: NEGATIVE

## 2018-08-15 LAB — GLUCOSE, CAPILLARY
GLUCOSE-CAPILLARY: 106 mg/dL — AB (ref 70–99)
Glucose-Capillary: 141 mg/dL — ABNORMAL HIGH (ref 70–99)
Glucose-Capillary: 167 mg/dL — ABNORMAL HIGH (ref 70–99)
Glucose-Capillary: 116 mg/dL — ABNORMAL HIGH (ref 70–99)

## 2018-08-15 NOTE — Progress Notes (Signed)
cpap refused 

## 2018-08-15 NOTE — Plan of Care (Signed)
  Problem: Clinical Measurements: Goal: Respiratory complications will improve Outcome: Progressing   Problem: Elimination: Goal: Will not experience complications related to urinary retention Outcome: Progressing   Problem: Activity: Goal: Capacity to carry out activities will improve Outcome: Progressing   Problem: Cardiac: Goal: Ability to achieve and maintain adequate cardiopulmonary perfusion will improve Outcome: Progressing

## 2018-08-15 NOTE — Progress Notes (Signed)
[employer] - [place] at [facility]   PATIENT NAME: Patty Roberts    MR#:  [id number]  DATE OF BIRTH:  04/07/1932  SUBJECTIVE:  CHIEF COMPLAINT:   Chief Complaint  Patient presents with  . Shortness of Breath  The patient has generalized weakness, on oxygen 2 L by nasal cannula. REVIEW OF SYSTEMS:  Review of Systems  Constitutional: Positive for malaise/fatigue. Negative for chills, fever and weight loss.  HENT: Negative for nosebleeds and sore throat.   Eyes: Negative for blurred vision.  Respiratory: Negative for cough, shortness of breath and wheezing.   Cardiovascular: Negative for chest pain, orthopnea, leg swelling and PND.  Gastrointestinal: Negative for abdominal pain, constipation, diarrhea, heartburn, nausea and vomiting.  Genitourinary: Negative for dysuria and urgency.  Musculoskeletal: Negative for back pain.  Skin: Negative for rash.  Neurological: Negative for dizziness, speech change, focal weakness and headaches.  Endo/Heme/Allergies: Does not bruise/bleed easily.  Psychiatric/Behavioral: Negative for depression.   DRUG ALLERGIES:   Allergies  Allergen Reactions  . Phenameth [Promethazine]   . Statins Other (See Comments)    Leg pain    VITALS:  Blood pressure (!) 151/61, pulse 63, temperature (!) 97.3 F (36.3 C), temperature source Oral, resp. rate 19, height 5\' 4"  (1.626 m), weight 100.8 kg, SpO2 99 %. PHYSICAL EXAMINATION:  Physical Exam Constitutional:      General: She is not in acute distress.    Comments: Morbid obesity.  HENT:     Head: Normocephalic and atraumatic.     Mouth/Throat:     Mouth: Mucous membranes are moist.  Eyes:     Conjunctiva/sclera: Conjunctivae normal.     Pupils: Pupils are equal, round, and reactive to light.  Neck:     Musculoskeletal: Normal range of motion and neck supple.     Thyroid: No thyromegaly.     Trachea: No tracheal deviation.  Cardiovascular:     Rate and Rhythm: Normal rate and  regular rhythm.     Heart sounds: Normal heart sounds.  Pulmonary:     Effort: Pulmonary effort is normal. No respiratory distress.     Breath sounds: Normal breath sounds. No wheezing.  Chest:     Chest wall: No tenderness.  Abdominal:     General: Bowel sounds are normal. There is no distension.     Palpations: Abdomen is soft.     Tenderness: There is no abdominal tenderness.  Musculoskeletal: Normal range of motion.        General: No tenderness.     Right lower leg: No edema.     Left lower leg: No edema.  Skin:    General: Skin is warm and dry.     Findings: No erythema or rash.  Neurological:     Mental Status: She is alert and oriented to person, place, and time.     Cranial Nerves: No cranial nerve deficit.  Psychiatric:        Mood and Affect: Mood normal.    LABORATORY PANEL:  Female CBC Recent Labs  Lab 08/12/18 0320 08/14/18 0446  WBC 4.8  --   HGB 7.8* 7.6*  HCT 25.6*  --   PLT 185  --    ------------------------------------------------------------------------------------------------------------------ Chemistries  Recent Labs  Lab 08/15/18 0351  NA 128*  K 5.2*  CL 92*  CO2 26  GLUCOSE 121*  BUN 98*  CREATININE 4.98*  CALCIUM 7.4*   RADIOLOGY:  No results found. ASSESSMENT AND PLAN:   *Acute on  chronic hypoxic respiratory failure - likely multifactorial process that includes congestive heart failure and COPD exacerbations Try to wean off oxygen by nasal cannula, DuoNeb as needed.  Mucinex as needed.  *Acute COPD exacerbation Changed po prednisone, continue mucolytic agents, inhaled corticosteroids twice daily, scheduled breathing treatments, respiratory therapy.  *Acute on chronic diastolic congestive heart failure exacerbation,  She was on IV Lasix twice daily, changed to p.o. Lasix 40 mg daily, hold due to worsening renal function per Dr. Wynelle Link.   Continue beta-blocker therapy, Crestor, strict I&O monitoring, daily weights, continue  close medical monitoring Most recent echocardiogram from April 2019 noted for ejection fraction of 60 to 65%  *Chronic benign essential hypertension Hold Norvasc, hold coreg due to low side BP.  *Acute renal failure with hyponatremia and hyperkalemia on chronic kidney disease stage IV with nephrotic range proteinuria: - likely cardiorenal syndrome Worsening renal function and hyponatremia. hold p.o. Lasix and follow-up BMP and no indication for HD per Dr. Wynelle Link.  Hyperkalemia.  Continue Lokelma.  * Chronic diabetes mellitus type 2 - Continue sliding scale insulin with Accu-Cheks per routine Discontinue Lantus 8 units qhs.  * Morbid Obesity.  Possible OSA. CPAP at night.  - counseled, PCP f/up  Anemia of chronic disease. Hb is low but stable.  I discussed with Dr. Wynelle Link. All the records are reviewed and case discussed with Care Management/Social Worker. Management plans discussed with the patient, her daughter and they are in agreement.  CODE STATUS: Full Code  TOTAL TIME TAKING CARE OF THIS PATIENT: 27 minutes.   More than 50% of the time was spent in counseling/coordination of care: YES  POSSIBLE D/C IN 2 DAYS, DEPENDING ON CLINICAL CONDITION.   Shaune Pollack M.D on 08/15/2018 at 3:17 PM  Between 7am to 6pm - Pager - [phone]  After 6pm go to [email or web address] - password [email or web address] [facility]  [facility]  Office  [phone]  CC: Primary care physician; Particia Nearing, PA-C  Note: This dictation was prepared with Dragon dictation along with smaller phrase technology. Any transcriptional errors that result from this process are unintentional.

## 2018-08-15 NOTE — Progress Notes (Signed)
[facility]  ROUNDING NOTE   Subjective:   Daughter at bedside.   UOP 1350  Creatinine 4.98 (5.34) BUN 98 (95)  K 5.2 (4.9)   Objective:  Vital signs in last 24 hours:  Temp:  [97.3 F (36.3 C)-97.7 F (36.5 C)] 97.3 F (36.3 C) (12/14 0446) Pulse Rate:  [55-113] 63 (12/14 0804) Resp:  [16-19] 19 (12/14 0804) BP: (105-151)/(33-62) 151/61 (12/14 0804) SpO2:  [92 %-99 %] 99 % (12/14 0804) Weight:  [100.8 kg] 100.8 kg (12/14 0446)  Weight change: -1.565 kg Filed Weights   08/13/18 0454 08/14/18 0446 08/15/18 0446  Weight: 103.3 kg 102.4 kg 100.8 kg    Intake/Output: I/O last 3 completed shifts: In: 0  Out: 1500 [Urine:1500]   Intake/Output this shift:  No intake/output data recorded.  Physical Exam: General: NAD,   Head: Normocephalic, atraumatic. Moist oral mucosal membranes  Eyes: Anicteric, PERRL  Neck: Supple, trachea midline  Lungs:  Clear to auscultation, 2 L [place]  Heart: Regular rate and rhythm  Abdomen:  Soft, nontender,   Extremities: no peripheral edema.  Neurologic: Nonfocal, moving all four extremities  Skin: No lesions        Basic Metabolic Panel: Recent Labs  Lab 08/11/18 0555 08/12/18 0320 08/13/18 0352 08/14/18 0446 08/15/18 0351  NA 134* 133* 129* 129* 128*  K 5.6* 5.9* 5.6* 4.9 5.2*  CL 100 98 95* 92* 92*  CO2 27 27 22 25 26   GLUCOSE 258* 147* 183* 127* 121*  BUN 61* 71* 82* 95* 98*  CREATININE 3.47* 3.91* 4.83* 5.34* 4.98*  CALCIUM 7.4* 7.7* 7.5* 7.3* 7.4*    Liver Function Tests: No results for input(s): AST, ALT, ALKPHOS, BILITOT, PROT, ALBUMIN in the last 168 hours. No results for input(s): LIPASE, AMYLASE in the last 168 hours. No results for input(s): AMMONIA in the last 168 hours.  CBC: Recent Labs  Lab 08/10/18 1348 08/11/18 0736 08/12/18 0320 08/14/18 0446  WBC 3.7* 3.9* 4.8  --   HGB 8.5* 8.0* 7.8* 7.6*  HCT 26.4* 25.3* 25.6*  --   MCV 121.1* 123.4* 124.9*  --   PLT 218 184 185  --      Cardiac Enzymes: Recent Labs  Lab 08/10/18 1348 08/10/18 1909 08/10/18 2345 08/11/18 0555  TROPONINI 0.03* <0.03 0.03* <0.03    BNP: Invalid input(s): POCBNP  CBG: Recent Labs  Lab 08/14/18 0814 08/14/18 1143 08/14/18 1637 08/14/18 2102 08/15/18 0801  GLUCAP 83 81 74 91 106*    Microbiology: No results found for this or any previous visit.  Coagulation Studies: No results for input(s): LABPROT, INR in the last 72 hours.  Urinalysis: No results for input(s): COLORURINE, LABSPEC, PHURINE, GLUCOSEU, HGBUR, BILIRUBINUR, KETONESUR, PROTEINUR, UROBILINOGEN, NITRITE, LEUKOCYTESUR in the last 72 hours.  Invalid input(s): APPERANCEUR    Imaging: No results found.   Medications:   . sodium chloride     . budesonide (PULMICORT) nebulizer solution  0.5 mg Nebulization BID  . docusate sodium  100 mg Oral BID  . heparin  5,000 Units Subcutaneous Q8H  . insulin aspart  0-20 Units Subcutaneous TID WC  . insulin aspart  0-5 Units Subcutaneous QHS  . ipratropium-albuterol  3 mL Nebulization Q6H  . pantoprazole  40 mg Oral Daily  . predniSONE  40 mg Oral Q breakfast  . rosuvastatin  5 mg Oral QODAY  . sodium chloride flush  3 mL Intravenous Q12H  . sodium zirconium cyclosilicate  10 g Oral Daily   sodium  chloride, acetaminophen, alum & mag hydroxide-simeth, benzonatate, bisacodyl, guaiFENesin, hydrALAZINE, ondansetron (ZOFRAN) IV, ondansetron, phenol, senna, sodium chloride flush  Assessment/ Plan:  [name] is a 84 y.o. black female with diabetes mellitus type II, aortic stenosis, hypertension, hypercholesterolemia who was admitted to [facility] on 08/10/2018 for acute diastolic congestive heart failure  1. Acute renal failure with hyponatremia and hyperkalemia on chronic kidney disease stage IV with nephrotic range proteinuria: baseline creatinine of 1.85, GFR of 29 on 03/26/18.  Chronic kidney disease secondary to diabetic nephropathy Acute renal failure secondary  to acute cardiorenal syndrome from acute diastolic congestive heart failure and then from overdiuresis.  Discussed dialysis with patient. No indication for dialysis at this time.  - hold furosmide.  - Lokelma   2. Hypertension with aortic stenosis: echocardiogram with diastolic congestive heart failure. Echocardiogram on 4/19 shows preserved systolic function Furosemide being held.    3. Acute exacerbation of COPD - systemic steroids.  - O2  - supportive care.    LOS: 5 Patty Roberts 12/14/201910:18 AM

## 2018-08-16 ENCOUNTER — Encounter: Payer: Self-pay | Admitting: *Deleted

## 2018-08-16 LAB — BASIC METABOLIC PANEL
Anion gap: 10 (ref 5–15)
BUN: 96 mg/dL — ABNORMAL HIGH (ref 8–23)
CHLORIDE: 93 mmol/L — AB (ref 98–111)
CO2: 28 mmol/L (ref 22–32)
Calcium: 8 mg/dL — ABNORMAL LOW (ref 8.9–10.3)
Creatinine, Ser: 4.19 mg/dL — ABNORMAL HIGH (ref 0.44–1.00)
GFR calc Af Amer: 11 mL/min — ABNORMAL LOW (ref >60)
GFR calc non Af Amer: 9 mL/min — ABNORMAL LOW (ref >60)
Glucose, Bld: 93 mg/dL (ref 70–99)
Potassium: 4.8 mmol/L (ref 3.5–5.1)
Sodium: 131 mmol/L — ABNORMAL LOW (ref 135–145)

## 2018-08-16 LAB — HEMOGLOBIN: HEMOGLOBIN: 7.4 g/dL — AB (ref 12.0–15.0)

## 2018-08-16 LAB — PREPARE RBC (CROSSMATCH)

## 2018-08-16 LAB — GLUCOSE, CAPILLARY
Glucose-Capillary: 76 mg/dL (ref 70–99)
Glucose-Capillary: 82 mg/dL (ref 70–99)

## 2018-08-16 MED ORDER — SODIUM CHLORIDE 0.9% IV SOLUTION
Freq: Once | INTRAVENOUS | Status: AC
  Administered 2018-08-16: 15:00:00 via INTRAVENOUS

## 2018-08-16 MED ORDER — CARVEDILOL 6.25 MG PO TABS
6.2500 mg | ORAL_TABLET | Freq: Two times a day (BID) | ORAL | Status: DC
  Administered 2018-08-16: 6.25 mg via ORAL
  Filled 2018-08-16: qty 1

## 2018-08-16 MED ORDER — IPRATROPIUM-ALBUTEROL 0.5-2.5 (3) MG/3ML IN SOLN: 3.0000 mL | Freq: Four times a day (QID) | RESPIRATORY_TRACT | Status: DC | PRN

## 2018-08-16 MED ORDER — IPRATROPIUM-ALBUTEROL 0.5-2.5 (3) MG/3ML IN SOLN
3.0000 mL | Freq: Three times a day (TID) | RESPIRATORY_TRACT | Status: DC
  Administered 2018-08-16 – 2018-08-18 (×6): 3 mL via RESPIRATORY_TRACT
  Filled 2018-08-16 (×7): qty 3

## 2018-08-16 MED ORDER — ACETAMINOPHEN 325 MG PO TABS
650.0000 mg | ORAL_TABLET | Freq: Once | ORAL | Status: AC
  Administered 2018-08-16: 650 mg via ORAL
  Filled 2018-08-16: qty 2

## 2018-08-16 NOTE — Progress Notes (Signed)
Per Dr. Imogene Burnhen would like for patient to have a blood transfusion. Md was made aware that patient does not have an IV and the Iv team was unable to get iv prior. Per Dr. Imogene Burnhen re attempt to get iv and have iv team try if staff is unable. Per Dr. Imogene Burnhen he does not want patient to get Picc line for transfusion. If unable to get iv then patient will not get blood transfusion

## 2018-08-16 NOTE — Progress Notes (Signed)
Patient declines CPAP at this time. Currently on 2L [place] at 97%, resting comfortably in bed. Will continue to monitor.

## 2018-08-16 NOTE — Plan of Care (Signed)
  Problem: Education: Goal: Knowledge of General Education information will improve Description: Including pain rating scale, medication(s)/side effects and non-pharmacologic comfort measures Outcome: Progressing   Problem: Health Behavior/Discharge Planning: Goal: Ability to manage health-related needs will improve Outcome: Progressing   Problem: Clinical Measurements: Goal: Ability to maintain clinical measurements within normal limits will improve Outcome: Progressing Goal: Diagnostic test results will improve Outcome: Progressing   Problem: Pain Managment: Goal: General experience of comfort will improve Outcome: Progressing   

## 2018-08-16 NOTE — Progress Notes (Signed)
Patients type and screen expired patient had to be re stuck and tested prior to receiving blood transfusion. Currently waiting for okay to transfuse per blood bank

## 2018-08-16 NOTE — Progress Notes (Signed)
[employer] [facility] [place] [facility]   PATIENT NAME: Patty Roberts    MR#:  [id number]  DATE OF BIRTH:  02/17/1932  SUBJECTIVE:  CHIEF COMPLAINT:   Chief Complaint  Patient presents with  . Shortness of Breath  The patient has generalized weakness, still on oxygen 2 L by nasal cannula. REVIEW OF SYSTEMS:  Review of Systems  Constitutional: Positive for malaise/fatigue. Negative for chills, fever and weight loss.  HENT: Negative for nosebleeds and sore throat.   Eyes: Negative for blurred vision.  Respiratory: Negative for cough, shortness of breath and wheezing.   Cardiovascular: Negative for chest pain, orthopnea, leg swelling and PND.  Gastrointestinal: Negative for abdominal pain, constipation, diarrhea, heartburn, nausea and vomiting.  Genitourinary: Negative for dysuria and urgency.  Musculoskeletal: Negative for back pain.  Skin: Negative for rash.  Neurological: Negative for dizziness, speech change, focal weakness and headaches.  Endo/Heme/Allergies: Does not bruise/bleed easily.  Psychiatric/Behavioral: Negative for depression.   DRUG ALLERGIES:   Allergies  Allergen Reactions  . Phenameth [Promethazine]   . Statins Other (See Comments)    Leg pain    VITALS:  Blood pressure 136/75, pulse 68, temperature 98.2 F (36.8 C), temperature source Oral, resp. rate 19, height 5\' 4"  (1.626 m), weight 99.6 kg, SpO2 98 %. PHYSICAL EXAMINATION:  Physical Exam Constitutional:      General: She is not in acute distress.    Comments: Morbid obesity.  HENT:     Head: Normocephalic and atraumatic.     Mouth/Throat:     Mouth: Mucous membranes are moist.  Eyes:     Conjunctiva/sclera: Conjunctivae normal.     Pupils: Pupils are equal, round, and reactive to light.  Neck:     Musculoskeletal: Normal range of motion and neck supple.     Thyroid: No thyromegaly.     Trachea: No tracheal deviation.  Cardiovascular:     Rate and Rhythm: Normal rate and  regular rhythm.     Heart sounds: Normal heart sounds.  Pulmonary:     Effort: Pulmonary effort is normal. No respiratory distress.     Breath sounds: Normal breath sounds. No wheezing.  Chest:     Chest wall: No tenderness.  Abdominal:     General: Bowel sounds are normal. There is no distension.     Palpations: Abdomen is soft.     Tenderness: There is no abdominal tenderness.  Musculoskeletal: Normal range of motion.        General: No tenderness.     Right lower leg: No edema.     Left lower leg: No edema.  Skin:    General: Skin is warm and dry.     Findings: No erythema or rash.  Neurological:     Mental Status: She is alert and oriented to person, place, and time.     Cranial Nerves: No cranial nerve deficit.  Psychiatric:        Mood and Affect: Mood normal.    LABORATORY PANEL:  Female CBC Recent Labs  Lab 08/12/18 0320  08/16/18 0531  WBC 4.8  --   --   HGB 7.8*   < > 7.4*  HCT 25.6*  --   --   PLT 185  --   --    < > = values in this interval not displayed.   ------------------------------------------------------------------------------------------------------------------ Chemistries  Recent Labs  Lab 08/16/18 0531  NA 131*  K 4.8  CL 93*  CO2 28  GLUCOSE  93  BUN 96*  CREATININE 4.19*  CALCIUM 8.0*   RADIOLOGY:  No results found. ASSESSMENT AND PLAN:   *Acute  hypoxic respiratory failure - likely multifactorial process that includes congestive heart failure and COPD exacerbations Try to wean off oxygen by nasal cannula, DuoNeb as needed.  Mucinex as needed.  *Acute COPD exacerbation Changed po prednisone, continue mucolytic agents, inhaled corticosteroids twice daily, prn breathing treatments.  *Acute on chronic diastolic congestive heart failure exacerbation,  She was on IV Lasix twice daily, changed to p.o. Lasix 40 mg daily, hold due to worsening renal function per Dr. Wynelle Link.   Continue beta-blocker therapy, Crestor, strict I&O  monitoring, daily weights, continue close medical monitoring Most recent echocardiogram from April 2019 noted for ejection fraction of 60 to 65%  *Chronic benign essential hypertension Hold Norvasc, hold coreg due to low side BP. Resume Coreg.  *Acute renal failure with hyponatremia and hyperkalemia on chronic kidney disease stage IV with nephrotic range proteinuria: - likely cardiorenal syndrome Better renal function and hyponatremia. hold p.o. Lasix and follow-up BMP and no indication for HD per Dr. Wynelle Link.  Hyperkalemia.  Continue Lokelma.  Improved.  * Chronic diabetes mellitus type 2 - Continue sliding scale insulin with Accu-Cheks per routine Discontinued Lantus 8 units qhs.  * Morbid Obesity.  Possible OSA. CPAP at night.  - counseled, PCP f/up  Anemia of chronic disease. Hb decreased to 7.4.  Considered the patient has acute respiratory failure due to acute on chronic diastolic CHF, I would recommend 1 unit PRBC transfusion.  Follow-up hemoglobin.  I discussed with Dr. Wynelle Link. All the records are reviewed and case discussed with Care Management/Social Worker. Management plans discussed with the patient, her daughters and they are in agreement.  CODE STATUS: Full Code  TOTAL TIME TAKING CARE OF THIS PATIENT: 32 minutes.   More than 50% of the time was spent in counseling/coordination of care: YES  POSSIBLE D/C IN 2 DAYS, DEPENDING ON CLINICAL CONDITION.   Shaune Pollack M.D on 08/16/2018 at 4:07 PM  Between 7am to 6pm - Pager - [phone]  After 6pm go to [email or web address] - password [email or web address] [facility]  [facility] [place] [facility]  Office  [phone]  CC: Primary care physician; Particia Nearing, PA-C  Note: This dictation was prepared with Dragon dictation along with smaller phrase technology. Any transcriptional errors that result from this process are unintentional.

## 2018-08-16 NOTE — Plan of Care (Signed)
  Problem: Clinical Measurements: Goal: Respiratory complications will improve Outcome: Progressing Note:  Pt on 2L O2 acute   Problem: Nutrition: Goal: Adequate nutrition will be maintained Outcome: Progressing   Problem: Coping: Goal: Level of anxiety will decrease Outcome: Progressing   Problem: Elimination: Goal: Will not experience complications related to urinary retention Outcome: Progressing Note:  External catheter in place   Problem: Pain Managment: Goal: General experience of comfort will improve Outcome: Progressing Note:  No complaints of pain this shift   Problem: Safety: Goal: Ability to remain free from injury will improve Outcome: Progressing

## 2018-08-16 NOTE — Progress Notes (Signed)
[facility]  ROUNDING NOTE   Subjective:   Daughter at bedside.   UOP 1250 (1350)  Creatinine 4.19 (4.98) (5.34) BUN 96 (98) (95)  K 4.8 - Lokelma   Objective:  Vital signs in last 24 hours:  Temp:  [96.8 F (36 C)-98.2 F (36.8 C)] 98.2 F (36.8 C) (12/15 0438) Pulse Rate:  [49-67] 66 (12/15 0438) Resp:  [18-20] 20 (12/15 0438) BP: (148-156)/(51-73) 148/60 (12/15 0438) SpO2:  [93 %-100 %] 100 % (12/15 0438) Weight:  [99.6 kg] 99.6 kg (12/15 0438)  Weight change: -1.235 kg Filed Weights   08/14/18 0446 08/15/18 0446 08/16/18 0438  Weight: 102.4 kg 100.8 kg 99.6 kg    Intake/Output: I/O last 3 completed shifts: In: -  Out: 2600 [Urine:2600]   Intake/Output this shift:  No intake/output data recorded.  Physical Exam: General: NAD,   Head: Normocephalic, atraumatic. Moist oral mucosal membranes  Eyes: Anicteric, PERRL  Neck: Supple, trachea midline  Lungs:  Clear to auscultation, 2 L [place]  Heart: Regular rate and rhythm  Abdomen:  Soft, nontender,   Extremities: no peripheral edema.  Neurologic: Nonfocal, moving all four extremities  Skin: No lesions        Basic Metabolic Panel: Recent Labs  Lab 08/12/18 0320 08/13/18 0352 08/14/18 0446 08/15/18 0351 08/16/18 0531  NA 133* 129* 129* 128* 131*  K 5.9* 5.6* 4.9 5.2* 4.8  CL 98 95* 92* 92* 93*  CO2 27 22 25 26 28   GLUCOSE 147* 183* 127* 121* 93  BUN 71* 82* 95* 98* 96*  CREATININE 3.91* 4.83* 5.34* 4.98* 4.19*  CALCIUM 7.7* 7.5* 7.3* 7.4* 8.0*    Liver Function Tests: No results for input(s): AST, ALT, ALKPHOS, BILITOT, PROT, ALBUMIN in the last 168 hours. No results for input(s): LIPASE, AMYLASE in the last 168 hours. No results for input(s): AMMONIA in the last 168 hours.  CBC: Recent Labs  Lab 08/10/18 1348 08/11/18 0736 08/12/18 0320 08/14/18 0446 08/16/18 0531  WBC 3.7* 3.9* 4.8  --   --   HGB 8.5* 8.0* 7.8* 7.6* 7.4*  HCT 26.4* 25.3* 25.6*  --   --   MCV 121.1*  123.4* 124.9*  --   --   PLT 218 184 185  --   --     Cardiac Enzymes: Recent Labs  Lab 08/10/18 1348 08/10/18 1909 08/10/18 2345 08/11/18 0555  TROPONINI 0.03* <0.03 0.03* <0.03    BNP: Invalid input(s): POCBNP  CBG: Recent Labs  Lab 08/15/18 0801 08/15/18 1149 08/15/18 1620 08/15/18 2127 08/16/18 0751  GLUCAP 106* 141* 167* 116* 76    Microbiology: No results found for this or any previous visit.  Coagulation Studies: No results for input(s): LABPROT, INR in the last 72 hours.  Urinalysis: No results for input(s): COLORURINE, LABSPEC, PHURINE, GLUCOSEU, HGBUR, BILIRUBINUR, KETONESUR, PROTEINUR, UROBILINOGEN, NITRITE, LEUKOCYTESUR in the last 72 hours.  Invalid input(s): APPERANCEUR    Imaging: No results found.   Medications:   . sodium chloride     . budesonide (PULMICORT) nebulizer solution  0.5 mg Nebulization BID  . docusate sodium  100 mg Oral BID  . heparin  5,000 Units Subcutaneous Q8H  . insulin aspart  0-20 Units Subcutaneous TID WC  . insulin aspart  0-5 Units Subcutaneous QHS  . ipratropium-albuterol  3 mL Nebulization TID  . pantoprazole  40 mg Oral Daily  . predniSONE  40 mg Oral Q breakfast  . rosuvastatin  5 mg Oral QODAY  . sodium chloride  flush  3 mL Intravenous Q12H  . sodium zirconium cyclosilicate  10 g Oral Daily   sodium chloride, acetaminophen, alum & mag hydroxide-simeth, benzonatate, bisacodyl, guaiFENesin, hydrALAZINE, ipratropium-albuterol, ondansetron (ZOFRAN) IV, ondansetron, phenol, senna, sodium chloride flush  Assessment/ Plan:  [name] is a 84 y.o. black female with diabetes mellitus type II, aortic stenosis, hypertension, hypercholesterolemia who was admitted to [facility] on 08/10/2018 for acute diastolic congestive heart failure  1. Acute renal failure with hyponatremia and hyperkalemia on chronic kidney disease stage IV with nephrotic range proteinuria: baseline creatinine of 1.85, GFR of 29 on 03/26/18.  Chronic  kidney disease secondary to diabetic nephropathy Acute renal failure secondary to acute cardiorenal syndrome from acute diastolic congestive heart failure and then from overdiuresis.  Discussed dialysis with patient. No indication for dialysis at this time.  - holding furosmide.  - Lokelma   2. Hypertension with aortic stenosis: echocardiogram with diastolic congestive heart failure.   Furosemide being held due to overdiuresis  3. Acute exacerbation of COPD - systemic steroids.  - O2  - supportive care.   4. Anemia of chronic kidney disease: hemoglobin 7.4 - scheduled for PRBC transfusion today.    LOS: 6 Patty Roberts 12/15/201910:09 AM

## 2018-08-17 LAB — BASIC METABOLIC PANEL
Anion gap: 11 (ref 5–15)
BUN: 100 mg/dL — ABNORMAL HIGH (ref 8–23)
CO2: 25 mmol/L (ref 22–32)
Calcium: 7.9 mg/dL — ABNORMAL LOW (ref 8.9–10.3)
Chloride: 94 mmol/L — ABNORMAL LOW (ref 98–111)
Creatinine, Ser: 3.69 mg/dL — ABNORMAL HIGH (ref 0.44–1.00)
GFR calc Af Amer: 12 mL/min — ABNORMAL LOW (ref >60)
GFR calc non Af Amer: 11 mL/min — ABNORMAL LOW (ref >60)
GLUCOSE: 200 mg/dL — AB (ref 70–99)
Potassium: 4.8 mmol/L (ref 3.5–5.1)
Sodium: 130 mmol/L — ABNORMAL LOW (ref 135–145)

## 2018-08-17 LAB — GLUCOSE, CAPILLARY
GLUCOSE-CAPILLARY: 252 mg/dL — AB (ref 70–99)
Glucose-Capillary: 119 mg/dL — ABNORMAL HIGH (ref 70–99)
Glucose-Capillary: 164 mg/dL — ABNORMAL HIGH (ref 70–99)
Glucose-Capillary: 184 mg/dL — ABNORMAL HIGH (ref 70–99)
Glucose-Capillary: 170 mg/dL — ABNORMAL HIGH (ref 70–99)
Glucose-Capillary: 220 mg/dL — ABNORMAL HIGH (ref 70–99)

## 2018-08-17 LAB — HEMOGLOBIN: Hemoglobin: 8.6 g/dL — ABNORMAL LOW (ref 12.0–15.0)

## 2018-08-17 LAB — TYPE AND SCREEN
ABO/RH(D): B POS
Antibody Screen: NEGATIVE
Unit division: 0

## 2018-08-17 LAB — BPAM RBC
Blood Product Expiration Date: 202001022359
ISSUE DATE / TIME: 201912151523
Unit Type and Rh: 7300

## 2018-08-17 MED ORDER — CARVEDILOL 12.5 MG PO TABS
12.5000 mg | ORAL_TABLET | Freq: Two times a day (BID) | ORAL | Status: DC
  Administered 2018-08-17 – 2018-08-18 (×3): 12.5 mg via ORAL
  Filled 2018-08-17 (×3): qty 1

## 2018-08-17 MED ORDER — TORSEMIDE 20 MG PO TABS
40.0000 mg | ORAL_TABLET | Freq: Every day | ORAL | Status: DC
  Administered 2018-08-17 – 2018-08-18 (×2): 40 mg via ORAL
  Filled 2018-08-17 (×2): qty 2

## 2018-08-17 NOTE — Care Management Important Message (Signed)
Copy of signed IM left with patient in room.  

## 2018-08-17 NOTE — Progress Notes (Signed)
Anticoag Monitoring: Patient currently ordered Heparin 5000 units SQ q8h for DVT prevention. Will order CBC with AM labs tomorrow for platelet check per protocol.  Clovia CuffLisa Mollye Guinta, PharmD, BCPS 08/17/2018 10:07 AM

## 2018-08-17 NOTE — Progress Notes (Signed)
[employer] - [place] [facility]   PATIENT NAME: Patty Roberts    MR#:  [id number]  DATE OF BIRTH:  04/13/1932  SUBJECTIVE:  CHIEF COMPLAINT:   Chief Complaint  Patient presents with  . Shortness of Breath  The patient has cough and generalized weakness, still on oxygen 2 L by nasal cannula. REVIEW OF SYSTEMS:  Review of Systems  Constitutional: Positive for malaise/fatigue. Negative for chills, fever and weight loss.  HENT: Negative for nosebleeds and sore throat.   Eyes: Negative for blurred vision.  Respiratory: Positive for cough and sputum production. Negative for hemoptysis, shortness of breath and wheezing.   Cardiovascular: Negative for chest pain, orthopnea, leg swelling and PND.  Gastrointestinal: Negative for abdominal pain, constipation, diarrhea, heartburn, nausea and vomiting.  Genitourinary: Negative for dysuria and urgency.  Musculoskeletal: Negative for back pain.  Skin: Negative for rash.  Neurological: Negative for dizziness, speech change, focal weakness and headaches.  Endo/Heme/Allergies: Does not bruise/bleed easily.  Psychiatric/Behavioral: Negative for depression. The patient is not nervous/anxious.    DRUG ALLERGIES:   Allergies  Allergen Reactions  . Phenameth [Promethazine]   . Statins Other (See Comments)    Leg pain    VITALS:  Blood pressure (!) 156/78, pulse 65, temperature 97.9 F (36.6 C), temperature source Oral, resp. rate 19, height 5\' 4"  (1.626 m), weight 99.6 kg, SpO2 100 %. PHYSICAL EXAMINATION:  Physical Exam Constitutional:      General: She is not in acute distress.    Comments: Morbid obesity.  HENT:     Head: Normocephalic and atraumatic.     Mouth/Throat:     Mouth: Mucous membranes are moist.  Eyes:     Conjunctiva/sclera: Conjunctivae normal.     Pupils: Pupils are equal, round, and reactive to light.  Neck:     Musculoskeletal: Normal range of motion and neck supple.     Thyroid: No thyromegaly.       Trachea: No tracheal deviation.  Cardiovascular:     Rate and Rhythm: Normal rate and regular rhythm.     Heart sounds: Normal heart sounds.  Pulmonary:     Effort: Pulmonary effort is normal. No respiratory distress.     Breath sounds: Rales present. No wheezing.  Chest:     Chest wall: No tenderness.  Abdominal:     General: Bowel sounds are normal. There is no distension.     Palpations: Abdomen is soft.     Tenderness: There is no abdominal tenderness.  Musculoskeletal: Normal range of motion.        General: Swelling present. No tenderness.     Right lower leg: No edema.     Left lower leg: No edema.     Comments: Swelling on left hand and forearm.  Skin:    General: Skin is warm and dry.     Findings: No erythema or rash.  Neurological:     Mental Status: She is alert and oriented to person, place, and time.     Cranial Nerves: No cranial nerve deficit.  Psychiatric:        Mood and Affect: Mood normal.    LABORATORY PANEL:  Female CBC Recent Labs  Lab 08/12/18 0320  08/17/18 0643  WBC 4.8  --   --   HGB 7.8*   < > 8.6*  HCT 25.6*  --   --   PLT 185  --   --    < > = values in this  interval not displayed.   ------------------------------------------------------------------------------------------------------------------ Chemistries  Recent Labs  Lab 08/17/18 0643  NA 130*  K 4.8  CL 94*  CO2 25  GLUCOSE 200*  BUN 100*  CREATININE 3.69*  CALCIUM 7.9*   RADIOLOGY:  No results found. ASSESSMENT AND PLAN:   *Acute hypoxic respiratory failure - likely multifactorial process that includes congestive heart failure and COPD exacerbations Try to wean off oxygen by nasal cannula, DuoNeb as needed.  Mucinex as needed.  *Acute COPD exacerbation Changed po prednisone, continue mucolytic agents, inhaled corticosteroids twice daily, prn breathing treatments.  *Acute on chronic diastolic congestive heart failure exacerbation,  She was on IV Lasix twice  daily, changed to p.o. Lasix 40 mg daily, hold due to worsening renal function per Dr. Wynelle Link.   Continue beta-blocker therapy, Crestor, strict I&O monitoring, daily weights, continue close medical monitoring Most recent echocardiogram from April 2019 noted for ejection fraction of 60 to 65% Resume Lasix 40 mg p.o. daily per Dr. Thedore Mins.  *Chronic benign essential hypertension Hold Norvasc, hold coreg due to low side BP. Resumed Coreg.  *Acute renal failure with hyponatremia and hyperkalemia on chronic kidney disease stage IV with nephrotic range proteinuria: - likely cardiorenal syndrome Better renal function and hyponatremia. Resume p.o. Lasix and follow-up BMP per Dr. Thedore Mins. Hyperkalemia.  Continue Lokelma.  Improved.  * Chronic diabetes mellitus type 2 - Continue sliding scale insulin with Accu-Cheks per routine Discontinued Lantus 8 units qhs.  * Morbid Obesity.  Possible OSA. The patient refused CPAP at night. - counseled, PCP f/up  Anemia of chronic disease. Hb decreased to 7.4.  S/p 1 unit PRBC transfusion.  Hemoglobin is up to 8.6.  Generalized weakness.  The patient wants to go home and needs home health and PT. I discussed with Dr. Thedore Mins. All the records are reviewed and case discussed with Care Management/Social Worker. Management plans discussed with the patient, her daughters and they are in agreement.  CODE STATUS: Full Code  TOTAL TIME TAKING CARE OF THIS PATIENT: 26 minutes.   More than 50% of the time was spent in counseling/coordination of care: YES  POSSIBLE D/C IN 2 DAYS, DEPENDING ON CLINICAL CONDITION.   Shaune Pollack M.D on 08/17/2018 at 4:45 PM  Between 7am to 6pm - Pager - [phone]  After 6pm go to [email or web address] - password [email or web address] [facility]  [facility] [place] [facility]  Office  [phone]  CC: Primary care physician; Particia Nearing, PA-C  Note: This dictation was prepared with Dragon dictation along with smaller phrase  technology. Any transcriptional errors that result from this process are unintentional.

## 2018-08-17 NOTE — Progress Notes (Signed)
[facility]  ROUNDING NOTE   Subjective:   Daughter at bedside.  Patient is doing fair overall Able to eat without nausea or vomiting No acute SOB; able to ambulate However, still requiring O2 supplementation    Objective:  Vital signs in last 24 hours:  Temp:  [97.7 F (36.5 C)-98.2 F (36.8 C)] 97.9 F (36.6 C) (12/15 1929) Pulse Rate:  [62-69] 65 (12/16 0754) Resp:  [14-19] 19 (12/16 0754) BP: (136-156)/(46-112) 156/78 (12/16 0754) SpO2:  [96 %-100 %] 100 % (12/16 0754)  Weight change:  Filed Weights   08/14/18 0446 08/15/18 0446 08/16/18 0438  Weight: 102.4 kg 100.8 kg 99.6 kg    Intake/Output: I/O last 3 completed shifts: In: 305 [Blood:305] Out: 1400 [Urine:1400]   Intake/Output this shift:  Total I/O In: 3 [I.V.:3] Out: -   Physical Exam: General: NAD, sitting up in chair  Head: Normocephalic, atraumatic. Moist oral mucosal membranes  Eyes: Anicteric,   Neck: Supple, trachea midline  Lungs:  Mild basilar crackles, 2 L [place]  Heart: Regular rate and rhythm  Abdomen:  Soft, nontender,   Extremities: no peripheral edema.  Neurologic: Nonfocal, moving all four extremities  Skin: No lesions        Basic Metabolic Panel: Recent Labs  Lab 08/13/18 0352 08/14/18 0446 08/15/18 0351 08/16/18 0531 08/17/18 0643  NA 129* 129* 128* 131* 130*  K 5.6* 4.9 5.2* 4.8 4.8  CL 95* 92* 92* 93* 94*  CO2 22 25 26 28 25   GLUCOSE 183* 127* 121* 93 200*  BUN 82* 95* 98* 96* 100*  CREATININE 4.83* 5.34* 4.98* 4.19* 3.69*  CALCIUM 7.5* 7.3* 7.4* 8.0* 7.9*    Liver Function Tests: No results for input(s): AST, ALT, ALKPHOS, BILITOT, PROT, ALBUMIN in the last 168 hours. No results for input(s): LIPASE, AMYLASE in the last 168 hours. No results for input(s): AMMONIA in the last 168 hours.  CBC: Recent Labs  Lab 08/10/18 1348 08/11/18 0736 08/12/18 0320 08/14/18 0446 08/16/18 0531 08/17/18 0643  WBC 3.7* 3.9* 4.8  --   --   --   HGB 8.5* 8.0*  7.8* 7.6* 7.4* 8.6*  HCT 26.4* 25.3* 25.6*  --   --   --   MCV 121.1* 123.4* 124.9*  --   --   --   PLT 218 184 185  --   --   --     Cardiac Enzymes: Recent Labs  Lab 08/10/18 1348 08/10/18 1909 08/10/18 2345 08/11/18 0555  TROPONINI 0.03* <0.03 0.03* <0.03    BNP: Invalid input(s): POCBNP  CBG: Recent Labs  Lab 08/16/18 0751 08/16/18 1136 08/16/18 1703 08/16/18 2121 08/17/18 0755  GLUCAP 76 82 119* 164* 184*    Microbiology: No results found for this or any previous visit.  Coagulation Studies: No results for input(s): LABPROT, INR in the last 72 hours.  Urinalysis: No results for input(s): COLORURINE, LABSPEC, PHURINE, GLUCOSEU, HGBUR, BILIRUBINUR, KETONESUR, PROTEINUR, UROBILINOGEN, NITRITE, LEUKOCYTESUR in the last 72 hours.  Invalid input(s): APPERANCEUR    Imaging: No results found.   Medications:   . sodium chloride     . budesonide (PULMICORT) nebulizer solution  0.5 mg Nebulization BID  . carvedilol  12.5 mg Oral BID WC  . docusate sodium  100 mg Oral BID  . heparin  5,000 Units Subcutaneous Q8H  . insulin aspart  0-20 Units Subcutaneous TID WC  . insulin aspart  0-5 Units Subcutaneous QHS  . ipratropium-albuterol  3 mL Nebulization TID  .  pantoprazole  40 mg Oral Daily  . predniSONE  40 mg Oral Q breakfast  . rosuvastatin  5 mg Oral QODAY  . sodium chloride flush  3 mL Intravenous Q12H  . sodium zirconium cyclosilicate  10 g Oral Daily   sodium chloride, acetaminophen, alum & mag hydroxide-simeth, benzonatate, bisacodyl, guaiFENesin, hydrALAZINE, ipratropium-albuterol, ondansetron (ZOFRAN) IV, ondansetron, phenol, senna, sodium chloride flush  Assessment/ Plan:  [name] is a 84 y.o. black female with diabetes mellitus type II, aortic stenosis, hypertension, hypercholesterolemia who was [facility] 08/10/2018 for acute diastolic congestive heart failure  1. Acute renal failure with hyponatremia and hyperkalemia on chronic  kidney disease stage IV with nephrotic range proteinuria: baseline creatinine of 1.85, GFR of 29 on 03/26/18.  Chronic kidney disease secondary to diabetic nephropathy Acute renal failure secondary to acute cardiorenal syndrome from acute diastolic congestive heart failure and then from overdiuresis.  Will start maintenance torsemide at 40 mg daily  2. Hypertension with aortic stenosis: echocardiogram with diastolic congestive heart failure.    systolic in 150's Acceptable control  3. Acute exacerbation of COPD - systemic steroids.  - O2  - steroids likely causing high BUN    LOS: 7 Patty Roberts 12/16/201910:23 AM

## 2018-08-18 LAB — GLUCOSE, CAPILLARY: Glucose-Capillary: 109 mg/dL — ABNORMAL HIGH (ref 70–99)

## 2018-08-18 LAB — BASIC METABOLIC PANEL
Anion gap: 9 (ref 5–15)
BUN: 93 mg/dL — ABNORMAL HIGH (ref 8–23)
CALCIUM: 7.8 mg/dL — AB (ref 8.9–10.3)
CO2: 28 mmol/L (ref 22–32)
Chloride: 97 mmol/L — ABNORMAL LOW (ref 98–111)
Creatinine, Ser: 3.34 mg/dL — ABNORMAL HIGH (ref 0.44–1.00)
GFR calc Af Amer: 14 mL/min — ABNORMAL LOW (ref >60)
GFR calc non Af Amer: 12 mL/min — ABNORMAL LOW (ref >60)
Glucose, Bld: 159 mg/dL — ABNORMAL HIGH (ref 70–99)
Potassium: 4.8 mmol/L (ref 3.5–5.1)
Sodium: 134 mmol/L — ABNORMAL LOW (ref 135–145)

## 2018-08-18 LAB — HEMOGLOBIN: HEMOGLOBIN: 7.8 g/dL — AB (ref 12.0–15.0)

## 2018-08-18 MED ORDER — TORSEMIDE 20 MG PO TABS: 40.0000 mg | ORAL_TABLET | Freq: Every day | ORAL | 1 refills | Status: AC

## 2018-08-18 NOTE — Progress Notes (Addendum)
Pt Hgb was at 7.8. Notify prime. Will continue to monitor.  Update 0656: Pt oxygen was at 100 on 1 liter. Pt was off oxygen at this time. Will notify incoming nurse to re-check oxygen. Will continue to monitor. Pt request to walked after breakfast. Will continue monitor.

## 2018-08-18 NOTE — Progress Notes (Signed)
[facility]  ROUNDING NOTE   Subjective:   Daughter at bedside.  Patient is doing fair overall. Off of oxygen now.  Able to eat without nausea or vomiting No acute SOB; able to ambulate    Objective:  Vital signs in last 24 hours:  Temp:  [97.8 F (36.6 C)-98.5 F (36.9 C)] 97.8 F (36.6 C) (12/17 0800) Pulse Rate:  [59-69] 69 (12/17 0809) Resp:  [16-18] 16 (12/17 0800) BP: (123-145)/(43-62) 133/51 (12/17 0800) SpO2:  [90 %-100 %] 93 % (12/17 0800) Weight:  [101.5 kg] 101.5 kg (12/17 0501)  Weight change:  Filed Weights   08/15/18 0446 08/16/18 0438 08/18/18 0501  Weight: 100.8 kg 99.6 kg 101.5 kg    Intake/Output: I/O last 3 completed shifts: In: 308 [I.V.:3; Blood:305] Out: 1000 [Urine:1000]   Intake/Output this shift:  No intake/output data recorded.  Physical Exam: General: NAD, laying in bed  Head: Normocephalic, atraumatic. Moist oral mucosal membranes  Eyes: Anicteric,   Neck: Supple,   Lungs:  Clear today  Heart: Regular rate and rhythm  Abdomen:  Soft, nontender,   Extremities: no peripheral edema.  Neurologic: Nonfocal, moving all four extremities  Skin: No lesions        Basic Metabolic Panel: Recent Labs  Lab 08/14/18 0446 08/15/18 0351 08/16/18 0531 08/17/18 0643 08/18/18 0341  NA 129* 128* 131* 130* 134*  K 4.9 5.2* 4.8 4.8 4.8  CL 92* 92* 93* 94* 97*  CO2 25 26 28 25 28   GLUCOSE 127* 121* 93 200* 159*  BUN 95* 98* 96* 100* 93*  CREATININE 5.34* 4.98* 4.19* 3.69* 3.34*  CALCIUM 7.3* 7.4* 8.0* 7.9* 7.8*    Liver Function Tests: No results for input(s): AST, ALT, ALKPHOS, BILITOT, PROT, ALBUMIN in the last 168 hours. No results for input(s): LIPASE, AMYLASE in the last 168 hours. No results for input(s): AMMONIA in the last 168 hours.  CBC: Recent Labs  Lab 08/12/18 0320 08/14/18 0446 08/16/18 0531 08/17/18 0643 08/18/18 0341  WBC 4.8  --   --   --   --   HGB 7.8* 7.6* 7.4* 8.6* 7.8*  HCT 25.6*  --   --   --    --   MCV 124.9*  --   --   --   --   PLT 185  --   --   --   --     Cardiac Enzymes: No results for input(s): CKTOTAL, CKMB, CKMBINDEX, TROPONINI in the last 168 hours.  BNP: Invalid input(s): POCBNP  CBG: Recent Labs  Lab 08/17/18 0755 08/17/18 1139 08/17/18 1732 08/17/18 2101 08/18/18 0800  GLUCAP 184* 170* 220* 252* 109*    Microbiology: No results found for this or any previous visit.  Coagulation Studies: No results for input(s): LABPROT, INR in the last 72 hours.  Urinalysis: No results for input(s): COLORURINE, LABSPEC, PHURINE, GLUCOSEU, HGBUR, BILIRUBINUR, KETONESUR, PROTEINUR, UROBILINOGEN, NITRITE, LEUKOCYTESUR in the last 72 hours.  Invalid input(s): APPERANCEUR    Imaging: No results found.   Medications:   . sodium chloride     . budesonide (PULMICORT) nebulizer solution  0.5 mg Nebulization BID  . carvedilol  12.5 mg Oral BID WC  . docusate sodium  100 mg Oral BID  . heparin  5,000 Units Subcutaneous Q8H  . insulin aspart  0-20 Units Subcutaneous TID WC  . insulin aspart  0-5 Units Subcutaneous QHS  . ipratropium-albuterol  3 mL Nebulization TID  . pantoprazole  40 mg Oral Daily  .  predniSONE  40 mg Oral Q breakfast  . rosuvastatin  5 mg Oral QODAY  . sodium chloride flush  3 mL Intravenous Q12H  . sodium zirconium cyclosilicate  10 g Oral Daily  . torsemide  40 mg Oral Daily   sodium chloride, acetaminophen, alum & mag hydroxide-simeth, benzonatate, bisacodyl, guaiFENesin, hydrALAZINE, ipratropium-albuterol, ondansetron (ZOFRAN) IV, ondansetron, phenol, senna, sodium chloride flush  Assessment/ Plan:  [name] is a 84 y.o. black female with diabetes mellitus type II, aortic stenosis, hypertension, hypercholesterolemia who was [facility]10/2018 for acute diastolic congestive heart failure  1. Acute renal failure with hyponatremia and hyperkalemia on chronic kidney disease stage IV with nephrotic range proteinuria:  baseline creatinine of 1.85, GFR of 29 on 03/26/18.  Chronic kidney disease secondary to diabetic nephropathy Acute renal failure secondary to acute cardiorenal syndrome from acute diastolic congestive heart failure and then from overdiuresis. Continue maintenance torsemide at 40 mg daily S Creatinine slightly improved to 3.3 Follow up in January. Appt card given  2. Hypertension with aortic stenosis: echocardiogram with diastolic congestive heart failure.   Acceptable control  3. Acute exacerbation of COPD -  Management as per hospitalist team    LOS: 8 Herron Fero 12/17/201912:14 PM

## 2018-08-18 NOTE — Progress Notes (Signed)
Patient discharged to home with daughter. IV and telemetry d/c'd and removed prior to discharge.  Patient verbalizes understanding of discharge instructions.

## 2018-08-18 NOTE — Discharge Instructions (Signed)
Fall precaution. °HHPT °

## 2018-08-18 NOTE — Progress Notes (Signed)
Cardiovascular and Pulmonary Nurse Navigator Plan:   84 year old female admitted with dx of acute hypoxic respiratory failure secondary to CHF and COPD.  Patient also found to have acute renal failure with hyponatremia and hyperkalemia.  Patient has CKD Stage IV.  Past Medical Hx of DM, Morbid Obesity, and HTN.    CHF Education / Review:   Rounded on patient. Daughter at bedside.  Patient lives with daughter.  CHF is NOT a new dx for this patient.   Briefly reviewed definition of heart failure and signs and symptoms of an exacerbation.?Explained to patient that HF is a chronic illness which requires self-assessment / self-management along with help from the cardiologist/PCP.?? *Reviewed importance of and reason behind checking weight daily in the AM, after using the bathroom, but before getting dressed. Patient has scales.  Daughter stated that patient weighs every morning.  Explained to daughter that it is more important than ever now given her CKD and CHF.   ? *Reviewed with patient the following information: *Discussed when to call the Dr= weight gain of >2-3lb overnight of 5lb in a week,  *Discussed yellow zone= call MD: weight gain of >2-3lb overnight of 5lb in a week, increased swelling, increased SOB when lying down, chest discomfort, dizziness, increased fatigue *Red Zone= call 911: struggle to breath, fainting or near fainting, significant chest pain   *Reviewed low sodium diet-provided handout of recommended and not recommended foods.? ? *Discussed fluid intake with patient as well. Dr. Thedore MinsSingh has instructed patient not to drink more than 1500 ml fluid per 24 hour period.   ? *Instructed patient to take medications as prescribed for heart failure. Explained briefly why pt is on the medications (either make you feel better, live longer or keep you out of the hospital) and discussed monitoring and side effects.  ? *Discussed exercise / activity.  Patient has order for in home PT, SW, Aide,  RN and Heart Failure protocol.  Patient ambulates with a walker.  Encouraged patient to remain as active as possible.   ? *Smoking Cessation- Patient is a NEVER smoker.? ? *[facility] - Patient is already an established patient in the [facility].  Patient's next appointment is 08/20/2018 at 1:00 p.m.    Patient also has an appt with her PCP on 08/21/2018 and Dr. Thedore MinsSingh, Nephrologist on 09/04/2018.   ? Again, the 5 Steps to Living Better with Heart Failure were reviewed with patient.  ? Patient daughter thanked me for providing the above information. ? ? Army Meliaiane Emiko Osorto, RN, BSN, [facility]? [facility]  [facility] Cardiac &?Pulmonary Rehab  Cardiovascular &?Pulmonary Nurse Navigator  Direct Line: [phone]  Department Phone #: [phone] Fax: [phone]? Email Address: [email or web address]

## 2018-08-18 NOTE — Care Management Note (Signed)
Case Management Note  Patient Details  Name: Patty Roberts MRN: [id number] Date of Birth: 11/30/1931  Subjective/Objective:    Patient discharging today.  On room air.  After presenting home health list with ratings referral made to [facility] and accepted.  Home health services will be RN, heart failure protocol, PT, SW, aide.  [facility] and [facility] with [facility].  Plans to discharge to home today.              Action/Plan:   Expected Discharge Date:  08/18/18               Expected Discharge Plan:  Home w Home Health Services  In-House Referral:     Discharge planning Services  CM Consult  Post Acute Care Choice:    Choice offered to:     DME Arranged:    DME Agency:     HH Arranged:  RN, PT, Nurse's Aide, Social Work(HF protocol) HH Agency:  [facility] (now [facility])  Status of Service:  Completed, signed off  If discussed at [employer] Meetings, dates discussed:    Additional Comments:  Sherren KernsJennifer L Aideliz Garmany, RN 08/18/2018, 9:49 AM

## 2018-08-18 NOTE — Plan of Care (Signed)
  Problem: Health Behavior/Discharge Planning: Goal: Ability to manage health-related needs will improve Outcome: Progressing   Problem: Activity: Goal: Risk for activity intolerance will decrease Outcome: Progressing   Problem: Safety: Goal: Ability to remain free from injury will improve Outcome: Progressing   Problem: Activity: Goal: Capacity to carry out activities will improve Outcome: Progressing

## 2018-08-18 NOTE — Discharge Summary (Addendum)
[facility] [place] at [facility]   PATIENT NAME: Patty Roberts    MR#:  [id number]  DATE OF BIRTH:  March 21, 1932  DATE OF ADMISSION:  08/10/2018   ADMITTING PHYSICIAN: Bertrum Sol, MD  DATE OF DISCHARGE: 08/18/2018 PRIMARY CARE PHYSICIAN: Particia Nearing, PA-C   ADMISSION DIAGNOSIS:  Weakness [R53.1] Acute respiratory failure with hypoxia (HCC) [J96.01] AKI (acute kidney injury) (HCC) [N17.9] Anemia, unspecified type [D64.9] DISCHARGE DIAGNOSIS:  Active Problems:   Respiratory failure (HCC)  SECONDARY DIAGNOSIS:   Past Medical History:  Diagnosis Date  . CHF (congestive heart failure) (HCC)   . Chronic kidney disease   . Diabetes mellitus without complication (HCC)   . GERD (gastroesophageal reflux disease)   . Hyperlipidemia   . Hypertension   . Hyponatremia    HOSPITAL COURSE:  *Acute hypoxic respiratory failure - likely multifactorial process that includes congestive heart failure and COPD exacerbations Weaned off oxygen by nasal cannula, DuoNeb as needed.  Mucinex as needed.  *Acute COPD exacerbation Changed to and completed po prednisone, continue mucolytic agents, inhaled corticosteroids twice daily, prn breathing treatments.  *Acute on chronic diastolic congestive heart failure exacerbation,  She was on IV Lasix twice daily, changed to p.o. Lasix 40 mg daily, hold due to worsening renal function per Dr. Wynelle Link.   Continue beta-blocker therapy, Crestor,strict I&O monitoring, daily weights, continue close medical monitoring Most recent echocardiogram from April 2019 noted for ejection fraction of 60 to 65% Started and continue torsemide 40 mg p.o. daily per Dr. Thedore Mins.  *Chronic benign essential hypertension Hold Norvasc and clonidine, hold coreg due to low side BP. Resumed Coreg.  *Acute renal failure with hyponatremia and hyperkalemia on chronic kidney disease stage IV with nephrotic range proteinuria: - likely cardiorenal  syndrome Better renal function and hyponatremia. Resume p.o. Lasix and follow-up BMP per Dr. Thedore Mins. Hyperkalemia. Improved with Lokelma.  * Chronic diabetes mellitus type 2 She is on sliding scale insulin with Accu-Cheks per routine Discontinued Lantus 8 units qhs.  * Morbid Obesity.  Possible OSA. The patient refused CPAP at night. - counseled, PCP f/up  Anemia of chronic disease. Hb decreased to 7.4.  S/p 1 unit PRBC transfusion.  Hemoglobin is up to 8.6. Hb is 7.8 today. Follow up Hb with PCP. No active bleeding.  Generalized weakness.  The patient wants to go home and needs home health and PT. I discussed with Dr. Thedore Mins. DISCHARGE CONDITIONS:  Stable, discharge to home with HHPT today. CONSULTS OBTAINED:  Treatment Team:  Lamont Dowdy, MD DRUG ALLERGIES:   Allergies  Allergen Reactions  . Phenameth [Promethazine]   . Statins Other (See Comments)    Leg pain    DISCHARGE MEDICATIONS:   Allergies as of 08/18/2018      Reactions   Phenameth [promethazine]    Statins Other (See Comments)   Leg pain      Medication List    STOP taking these medications   amLODipine 5 MG tablet Commonly known as:  NORVASC   cloNIDine 0.3 mg/24hr patch Commonly known as:  CATAPRES - Dosed in mg/24 hr   furosemide 40 MG tablet Commonly known as:  LASIX     TAKE these medications   benzonatate 200 MG capsule Commonly known as:  TESSALON TAKE 1 CAPSULE(200 MG) BY MOUTH THREE TIMES DAILY AS NEEDED What changed:  See the new instructions.   carvedilol 12.5 MG tablet Commonly known as:  COREG Take 1 tablet (12.5 mg total) by mouth 2 (  two) times daily with a meal. Instead of Bystolic   glipiZIDE 5 MG tablet Commonly known as:  GLUCOTROL Take 1 tablet (5 mg total) by mouth 2 (two) times daily before a meal.   guaiFENesin 600 MG 12 hr tablet Commonly known as:  MUCINEX Take 1 tablet (600 mg total) by mouth 2 (two) times daily as needed. What changed:  reasons to take  this   meclizine 25 MG tablet Commonly known as:  ANTIVERT Take 1 tablet (25 mg total) by mouth 2 (two) times daily as needed for dizziness.   omeprazole 20 MG capsule Commonly known as:  PRILOSEC Take 1 capsule (20 mg total) by mouth every other day. What changed:    when to take this  reasons to take this   ondansetron 4 MG disintegrating tablet Commonly known as:  ZOFRAN ODT Take 1 tablet (4 mg total) by mouth every 8 (eight) hours as needed for nausea or vomiting.   PROAIR HFA 108 (90 Base) MCG/ACT inhaler Generic drug:  albuterol INHALE 2 PUFFS BY MOUTH EVERY 6 HOURS IF NEEDED FOR SHORTNESS OF BREATH OR WHEEZING What changed:  See the new instructions.   rosuvastatin 5 MG tablet Commonly known as:  CRESTOR TAKE 1 TABLET DAILY What changed:  when to take this   torsemide 20 MG tablet Commonly known as:  DEMADEX Take 2 tablets (40 mg total) by mouth daily.        DISCHARGE INSTRUCTIONS:  See AVS.  If you experience worsening of your admission symptoms, develop shortness of breath, life threatening emergency, suicidal or homicidal thoughts you must seek medical attention immediately by calling 911 or calling your MD immediately  if symptoms less severe.  You Must read complete instructions/literature along with all the possible adverse reactions/side effects for all the Medicines you take and that have been prescribed to you. Take any new Medicines after you have completely understood and accpet all the possible adverse reactions/side effects.   Please note  You were cared for by a hospitalist during your hospital stay. If you have any questions about your discharge medications or the care you received while you were in the hospital after you are discharged, you can call the unit and asked to speak with the hospitalist on call if the hospitalist that took care of you is not available. Once you are discharged, your primary care physician will handle any further medical  issues. Please note that NO REFILLS for any discharge medications will be authorized once you are discharged, as it is imperative that you return to your primary care physician (or establish a relationship with a primary care physician if you do not have one) for your aftercare needs so that they can reassess your need for medications and monitor your lab values.    On the day of Discharge:  VITAL SIGNS:  Blood pressure (!) 133/51, pulse 69, temperature 97.8 F (36.6 C), temperature source Oral, resp. rate 16, height 5\' 4"  (1.626 m), weight 101.5 kg, SpO2 93 %. PHYSICAL EXAMINATION:  GENERAL:  84 y.o.-year-old patient lying in the bed with no acute distress. Morbid obesity. EYES: Pupils equal, round, reactive to light and accommodation. No scleral icterus. Extraocular muscles intact.  HEENT: Head atraumatic, normocephalic. Oropharynx and nasopharynx clear.  NECK:  Supple, no jugular venous distention. No thyroid enlargement, no tenderness.  LUNGS: Normal breath sounds bilaterally, no wheezing, rales,rhonchi or crepitation. No use of accessory muscles of respiration.  CARDIOVASCULAR: S1, S2 normal. No murmurs, rubs, or  gallops.  ABDOMEN: Soft, non-tender, non-distended. Bowel sounds present. No organomegaly or mass.  EXTREMITIES: No pedal edema, cyanosis, or clubbing.  NEUROLOGIC: Cranial nerves II through XII are intact. Muscle strength 3-4/5 in all extremities. Sensation intact. Gait not checked.  PSYCHIATRIC: The patient is alert and oriented x 3.  SKIN: No obvious rash, lesion, or ulcer.  DATA REVIEW:   CBC Recent Labs  Lab 08/12/18 0320  08/18/18 0341  WBC 4.8  --   --   HGB 7.8*   < > 7.8*  HCT 25.6*  --   --   PLT 185  --   --    < > = values in this interval not displayed.    Chemistries  Recent Labs  Lab 08/18/18 0341  NA 134*  K 4.8  CL 97*  CO2 28  GLUCOSE 159*  BUN 93*  CREATININE 3.34*  CALCIUM 7.8*     Microbiology Results  No results found for this or  any previous visit.  RADIOLOGY:  No results found.   Management plans discussed with the patient, her daughter and they are in agreement.  CODE STATUS: Full Code   TOTAL TIME TAKING CARE OF THIS PATIENT: 33 minutes.    Shaune Pollack M.D on 08/18/2018 at 10:20 AM  Between 7am to 6pm - Pager - [phone]  After 6pm go to [email or web address] - password [email or web address] [facility]  [facility]  Office  [phone]  CC: Primary care physician; Particia Nearing, PA-C   Note: This dictation was prepared with Dragon dictation along with smaller phrase technology. Any transcriptional errors that result from this process are unintentional.

## 2018-08-19 ENCOUNTER — Telehealth: Payer: Self-pay

## 2018-08-19 NOTE — Telephone Encounter (Signed)
Transition Care Management Follow-up Telephone Call  Date of discharge and from where: [facility] on 08/18/18.  How have you been since you were released from the hospital? Doing well, still has a nagging cough but has been getting up and moving around more with a walker. Seems to be getting stronger. Declines pain, fever, SOB or n/v/d.  Any questions or concerns? No  Items Reviewed:  Did the pt receive and understand the discharge instructions provided? Yes   Medications obtained and verified? Yes   Any new allergies since your discharge? No   Dietary orders reviewed? Yes  Do you have support at home? Yes   Other (ie: DME, Home Health, etc) N/A  Functional Questionnaire: (I = Independent and D = Dependent)  Bathing/Dressing- I   Meal Prep- D, daughter cooks  Eating- I  Maintaining continence- I  Transferring/Ambulation- Uses a walker to aid with transferring currently.  Managing Meds- I   Follow up appointments reviewed:    PCP Hospital f/u appt confirmed? Yes  Scheduled to see Roosvelt MaserRachel Lane on 08/21/18 @ 2:30 PM .  Specialist Hospital f/u appt confirmed? Yes   Are transportation arrangements needed? No   If their condition worsens, is the pt aware to call  their PCP or go to the ED? Yes  Was the patient provided with contact information for the PCP's office or ED? Yes  Was the pt encouraged to call back with questions or concerns? Yes

## 2018-08-20 ENCOUNTER — Ambulatory Visit: Payer: Medicare Other | Admitting: Family

## 2018-08-21 ENCOUNTER — Inpatient Hospital Stay: Payer: Medicare Other | Admitting: Family Medicine

## 2018-08-24 ENCOUNTER — Telehealth: Payer: Self-pay | Admitting: Family Medicine

## 2018-08-24 NOTE — Telephone Encounter (Signed)
Copied from CRM [phone]. Topic: Quick Communication - Home Health Verbal Orders >> Aug 24, 2018  1:22 PM Angela NevinWilliams, Candice N wrote: Caller/AgencyVelna Ochs: [facility] Callback Number: [phone] Requesting OT/PT/Skilled Nursing/Social Work: Skilled nursing  Frequency: 2w3 1w5 3prn

## 2018-08-24 NOTE — Telephone Encounter (Signed)
OK to give verbal.

## 2018-08-24 NOTE — Telephone Encounter (Signed)
Called and let Marchelle Folksmanda know that Fleet ContrasRachel gave the verbal OK for orders.

## 2018-08-28 ENCOUNTER — Inpatient Hospital Stay: Payer: Medicare Other | Admitting: Family Medicine

## 2018-09-05 ENCOUNTER — Emergency Department
Admission: EM | Admit: 2018-09-05 | Discharge: 2018-10-03 | Disposition: E | Payer: Medicare Other | Attending: Internal Medicine | Admitting: Internal Medicine

## 2018-09-05 ENCOUNTER — Emergency Department: Payer: Medicare Other

## 2018-09-05 ENCOUNTER — Encounter: Payer: Self-pay | Admitting: Emergency Medicine

## 2018-09-05 DIAGNOSIS — E119 Type 2 diabetes mellitus without complications: Secondary | ICD-10-CM | POA: Diagnosis not present

## 2018-09-05 DIAGNOSIS — I469 Cardiac arrest, cause unspecified: Secondary | ICD-10-CM | POA: Insufficient documentation

## 2018-09-05 DIAGNOSIS — Z950 Presence of cardiac pacemaker: Secondary | ICD-10-CM | POA: Insufficient documentation

## 2018-09-05 DIAGNOSIS — I130 Hypertensive heart and chronic kidney disease with heart failure and stage 1 through stage 4 chronic kidney disease, or unspecified chronic kidney disease: Secondary | ICD-10-CM | POA: Diagnosis not present

## 2018-09-05 DIAGNOSIS — I509 Heart failure, unspecified: Secondary | ICD-10-CM | POA: Diagnosis not present

## 2018-09-05 DIAGNOSIS — N189 Chronic kidney disease, unspecified: Secondary | ICD-10-CM | POA: Insufficient documentation

## 2018-09-05 LAB — CBC WITH DIFFERENTIAL/PLATELET
Abs Immature Granulocytes: 0.22 10*3/uL — ABNORMAL HIGH (ref 0.00–0.07)
Basophils Absolute: 0 10*3/uL (ref 0.0–0.1)
Basophils Relative: 1 %
Eosinophils Absolute: 0.1 10*3/uL (ref 0.0–0.5)
Eosinophils Relative: 2 %
HCT: 28.5 % — ABNORMAL LOW (ref 36.0–46.0)
Hemoglobin: 9 g/dL — ABNORMAL LOW (ref 12.0–15.0)
Immature Granulocytes: 3 %
Lymphocytes Relative: 54 %
Lymphs Abs: 4.1 10*3/uL — ABNORMAL HIGH (ref 0.7–4.0)
MCH: 39.8 pg — ABNORMAL HIGH (ref 26.0–34.0)
MCHC: 31.6 g/dL (ref 30.0–36.0)
MCV: 126.1 fL — ABNORMAL HIGH (ref 80.0–100.0)
Monocytes Absolute: 0.2 10*3/uL (ref 0.1–1.0)
Monocytes Relative: 3 %
Neutro Abs: 2.8 10*3/uL (ref 1.7–7.7)
Neutrophils Relative %: 37 %
Platelets: 155 10*3/uL (ref 150–400)
RBC: 2.26 MIL/uL — ABNORMAL LOW (ref 3.87–5.11)
RDW: 25.6 % — ABNORMAL HIGH (ref 11.5–15.5)
Smear Review: NORMAL
WBC: 7.6 10*3/uL (ref 4.0–10.5)
nRBC: 11.2 % — ABNORMAL HIGH (ref 0.0–0.2)

## 2018-09-05 LAB — CG4 I-STAT (LACTIC ACID): LACTIC ACID, VENOUS: 5.04 mmol/L — AB (ref 0.5–1.9)

## 2018-09-05 MED ORDER — ALTEPLASE 100 MG IV SOLR
INTRAVENOUS | Status: AC
  Administered 2018-09-05: 90 mg
  Filled 2018-09-05: qty 100

## 2018-09-05 MED ORDER — SODIUM CHLORIDE 0.9 % IV SOLN: 50.0000 mL | Freq: Once | INTRAVENOUS | Status: DC

## 2018-09-05 MED ORDER — SODIUM BICARBONATE 8.4 % IV SOLN
INTRAVENOUS | Status: AC | PRN
  Administered 2018-09-05 (×2): 100 meq via INTRAVENOUS

## 2018-09-05 MED ORDER — DEXTROSE 5 % IV SOLN: INTRAVENOUS | Status: AC | PRN

## 2018-09-05 MED ORDER — LABETALOL HCL 5 MG/ML IV SOLN
INTRAVENOUS | Status: AC
  Filled 2018-09-05: qty 4

## 2018-09-05 MED ORDER — ALTEPLASE (STROKE) FULL DOSE INFUSION
90.0000 mg | Freq: Once | INTRAVENOUS | Status: DC
  Filled 2018-09-05: qty 100

## 2018-09-05 MED ORDER — ATROPINE SULFATE 1 MG/ML IJ SOLN
INTRAMUSCULAR | Status: AC | PRN
  Administered 2018-09-05 (×4): 1 mg via INTRAVENOUS

## 2018-09-05 MED ORDER — EPINEPHRINE PF 1 MG/10ML IJ SOSY
PREFILLED_SYRINGE | INTRAMUSCULAR | Status: AC | PRN
  Administered 2018-09-05 (×6): 1 via INTRAVENOUS

## 2018-09-05 NOTE — ED Provider Notes (Addendum)
[facility]  Department of Emergency Medicine   Code Blue NOTE  Chief Complaint: Cardiac arrest/unresponsive   Level V Caveat: Unresponsive  History of present illness: Patient brought in by EMS for a CODE BLUE cardiac arrest prehospital and had initially presented for respiratory complaints according to EMS.  Patient lost a pulse prehospital, CPR was initiated and she received IO access on the right leg with 1 mg of epinephrine  ROS: Unable to obtain, Level V caveat  Scheduled Meds: . alteplase      . labetalol       Continuous Infusions: . alteplase     Followed by  . sodium chloride     PRN Meds:. Past Medical History:  Diagnosis Date  . CHF (congestive heart failure) (HCC)   . Chronic kidney disease   . Diabetes mellitus without complication (HCC)   . GERD (gastroesophageal reflux disease)   . Hyperlipidemia   . Hypertension   . Hyponatremia    Past Surgical History:  Procedure Laterality Date  . ABDOMINAL HYSTERECTOMY    . ANKLE FRACTURE SURGERY    . HERNIA REPAIR    . PACEMAKER INSERTION     x2  . vertebral fix     Social History   Socioeconomic History  . Marital status: Widowed    Spouse name: Not on file  . Number of children: Not on file  . Years of education: Not on file  . Highest education level: Not on file  Occupational History  . Not on file  Social Needs  . Financial resource strain: Not on file  . Food insecurity:    Worry: Not on file    Inability: Not on file  . Transportation needs:    Medical: Not on file    Non-medical: Not on file  Tobacco Use  . Smoking status: Never Smoker  . Smokeless tobacco: Never Used  Substance and Sexual Activity  . Alcohol use: No    Alcohol/week: 0.0 standard drinks  . Drug use: No  . Sexual activity: Not Currently  Lifestyle  . Physical activity:    Days per week: Not on file    Minutes per session: Not on file  . Stress: Not on file  Relationships  . Social connections:     Talks on phone: Not on file    Gets together: Not on file    Attends religious service: Not on file    Active member of club or organization: Not on file    Attends meetings of clubs or organizations: Not on file    Relationship status: Not on file  . Intimate partner violence:    Fear of current or ex partner: Not on file    Emotionally abused: Not on file    Physically abused: Not on file    Forced sexual activity: Not on file  Other Topics Concern  . Not on file  Social History Narrative  . Not on file   Allergies  Allergen Reactions  . Phenameth [Promethazine]   . Statins Other (See Comments)    Leg pain     Last set of Vital Signs (not current) Vitals:   03-22-19 1645 03-22-19 1653  BP: (!) 116/91 (!) 154/79  Pulse:    Resp: 13 16  Temp: (!) 94.7 F (34.8 C) (!) 95.6 F (35.3 C)  SpO2:        Physical Exam  Gen: unresponsive Cardiovascular: pulseless  Resp: apneic. Breath sounds equal bilaterally with bagging,  King airway in place Abd: nondistended  Neuro: GCS 3, unresponsive to pain  HEENT: No blood in posterior pharynx, gag reflex absent  Neck: No crepitus  Musculoskeletal: No deformity  Skin: warm  Procedures  INTUBATION Performed by: Ulice DashJohnathan E Ko Bardon Required items: required blood products, implants, devices, and special equipment available Patient identity confirmed: provided demographic data and hospital-assigned identification number Time out: Immediately prior to procedure a "time out" was called to verify the correct patient, procedure, equipment, support staff and site/side marked as required. Indications: Cardiac arrest Intubation method: Glide scope Preoxygenation: King airway Sedatives: None Paralytic: None Tube Size: 7.5 cuffed Post-procedure assessment: chest rise and ETCO2 monitor Breath sounds: equal and absent over the epigastrium Tube secured by Respiratory Therapy Patient tolerated the procedure well with no immediate  complications.  OG tube was placed by me under direct visualization after the intubation process.  Radiology: Chest x-ray IMPRESSION: FINDINGS: Endotracheal tube with tip projecting 2.0 cm above carina.  External pacing leads project over chest.  LEFT subclavian pacemaker leads project over RIGHT atrium and RIGHT ventricle.  Nasogastric tube, tip of which not well visualized.  Mild enlargement of cardiac silhouette.  Patchy infiltrates in mid to lower LEFT lung.  Remaining lungs grossly clear.  No definite pleural effusion or pneumothorax.  Bones demineralized.  IMPRESSION: Patchy infiltrates in the mid to lower LEFT lung; these could potentially reflect pneumonia or aspiration.  CRITICAL CARE Performed by: Ulice DashJohnathan E Marytza Grandpre Total critical care time: 60 Critical care time was exclusive of separately billable procedures and treating other patients. Critical care was necessary to treat or prevent imminent or life-threatening deterioration. Critical care was time spent personally by me on the following activities: development of treatment plan with patient and/or surrogate as well as nursing, discussions with consultants, evaluation of patient's response to treatment, examination of patient, obtaining history from patient or surrogate, ordering and performing treatments and interventions, ordering and review of laboratory studies, ordering and review of radiographic studies, pulse oximetry and re-evaluation of patient's condition.  Cardiopulmonary Resuscitation (CPR) Procedure Note  Directed/Performed by: Ulice DashJohnathan E Jazia Faraci I personally directed ancillary staff and/or performed CPR in an effort to regain return of spontaneous circulation and to maintain cardiac, neuro and systemic perfusion.  Labs Reviewed  CBC WITH DIFFERENTIAL/PLATELET - Abnormal; Notable for the following components:      Result Value   RBC 2.26 (*)    Hemoglobin 9.0 (*)    HCT 28.5 (*)    MCV  126.1 (*)    MCH 39.8 (*)    RDW 25.6 (*)    nRBC 11.2 (*)    Lymphs Abs 4.1 (*)    Abs Immature Granulocytes 0.22 (*)    All other components within normal limits  BLOOD GAS, VENOUS - Abnormal; Notable for the following components:   pH, Ven 7.21 (*)    pCO2, Ven 85 (*)    Bicarbonate 34.0 (*)    Acid-Base Excess 3.3 (*)    All other components within normal limits  CG4 I-STAT (LACTIC ACID) - Abnormal; Notable for the following components:   Lactic Acid, Venous 5.04 (*)    All other components within normal limits  PATHOLOGIST SMEAR REVIEW  COMPREHENSIVE METABOLIC PANEL  TROPONIN I  CBG MONITORING, ED  I-STAT CG4 LACTIC ACID, ED   EKG: Interpreted by me, regular paced rhythm with a rate of 107 bpm, ST elevation is noted in leads V 1 and V2.   Medical Decision making  Initial EKG to  me appeared to have changed significantly compared to recently.  There is ST elevation particularly in leads V1 and V2.  She has a paced ventricular rhythm but these changes were not present prior.  Rate was 107, long QT.  Assessment and Plan  Cardiopulmonary arrest  Patient initially had called EMS for respiratory symptoms.  She subsequently arrested in route.  After intubating her with an ET tube and giving a milligram of epinephrine she had return of spontaneous circulation temporarily.  She appeared to be in his paced rhythm.  She appeared to have ST elevation on her EKG.  I discussed with cardiology on-call.  Unfortunately she was not stable enough to be taken to the Cath Lab.  She subsequently arrested on several occasions.  Chest compressions and standard ACLS procedures were followed.  She was given numerous doses of epinephrine and atropine with only transient improvement in spontaneous circulation.  Cardiac wall motion was observed throughout much of the resuscitation process.  EF was noted to be markedly depressed.  Early on we did attempt to give her TPA which did not change her progress.  She  was noted to be hypoxic on arrival and this continued to worsen throughout her stay.  She was defibrillated 3 times after being observed in V. tach and subsequently V. fib.  She was given 300 mg of IV amiodarone.  She was also given doses of bicarb and calcium during the resuscitative efforts.  She was started on dopamine whenever she transiently had a blood pressure.  Again ACLS was followed throughout this entire process.  Eventually she went from poor cardiac motion to no cardiac motion.  Time of death was called and family was informed.    Emily Filbert, MD 09/06/2018 Zollie Pee    Emily Filbert, MD 09/14/2018 [phone]

## 2018-09-05 NOTE — ED Triage Notes (Signed)
Pt to ED by EMS in cardiac arrest. Pt ha hx of CHF and Renal failure. Per family pt has had increased weakness and c/o of SOB. EMS obtained king airway and administered 1 amp of EPI in route and arrived CPR in progress.

## 2018-09-05 NOTE — Progress Notes (Signed)
   10/02/2018 1730  Clinical Encounter Type  Visited With Family;Other (Comment);Health care provider (family pastors)  Visit Type Follow-up  Spiritual Encounters  Spiritual Needs Grief support;Emotional;Prayer   Chaplain followed up with patient family and encountered one family pastor in hallway.  Chaplain was present as physician informed family of patient's death.  Chaplain offered silent and energetic prayers throughout family's expressions of grief.  Chaplain remained present in room with family members who chose to not see patient, offering emotional and bereavement support.  Before chaplain left, she checked in with family pastors.  Chaplain then encountered another family member in the hallway seeking patient's room and escorted her to family.

## 2018-09-05 NOTE — Progress Notes (Signed)
   09-26-2018 1635  Clinical Encounter Type  Visited With Family;Health care provider  Visit Type Initial (page for family support)  Spiritual Encounters  Spiritual Needs Emotional;Prayer   Chaplain responded to page to report to room 10.  Upon arrival on unit, chaplain checked in with unit secretary and was directed to family consult room.  Chaplain maintained calming presence, using active and reflective listening as family processed events leading up to patient's arrival at ED.  Chaplain offered silent and energetic prayers as physician updated family.  Chaplain and family then prayed together for patient.  Chaplain stepped away and will follow up.  Chaplain updated Licensed conveyancer and asked for her to page if chaplain was needed before she returned.

## 2018-09-05 NOTE — ED Notes (Addendum)
Medications, Dentures and 4 rings sent with POA (Daughter-Kim [place]). Clothing sent with other daughter.

## 2018-09-07 ENCOUNTER — Ambulatory Visit: Payer: Medicare Other | Admitting: Family

## 2018-09-07 ENCOUNTER — Ambulatory Visit: Payer: Medicare Other | Admitting: Family Medicine

## 2018-09-07 LAB — PATHOLOGIST SMEAR REVIEW

## 2018-09-09 LAB — BLOOD GAS, VENOUS
ACID-BASE EXCESS: 3.3 mmol/L — AB (ref 0.0–2.0)
Bicarbonate: 34 mmol/L — ABNORMAL HIGH (ref 20.0–28.0)
FIO2: 1
MECHVT: 500 mL
O2 Saturation: 14.1 %
PEEP: 5 cmH2O
PH VEN: 7.21 — AB (ref 7.250–7.430)
Patient temperature: 37
RATE: 16 resp/min
pCO2, Ven: 85 mmHg (ref 44.0–60.0)

## 2018-10-03 DEATH — deceased

## 2018-10-20 ENCOUNTER — Ambulatory Visit: Payer: Medicare Other | Admitting: Cardiovascular Disease

## 2019-07-02 IMAGING — DX DG CHEST 1V
1 series · 1 of 1 positions shown · non-contrast
Comparison: Portable exam 0740 hours compared to 08/10/2018

CLINICAL DATA: Post intubation, hypothermia, cardiac arrest,
history CHF and renal failure, increased shortness of breath and
weakness

EXAM:
CHEST  1 VIEW

[chest ap]
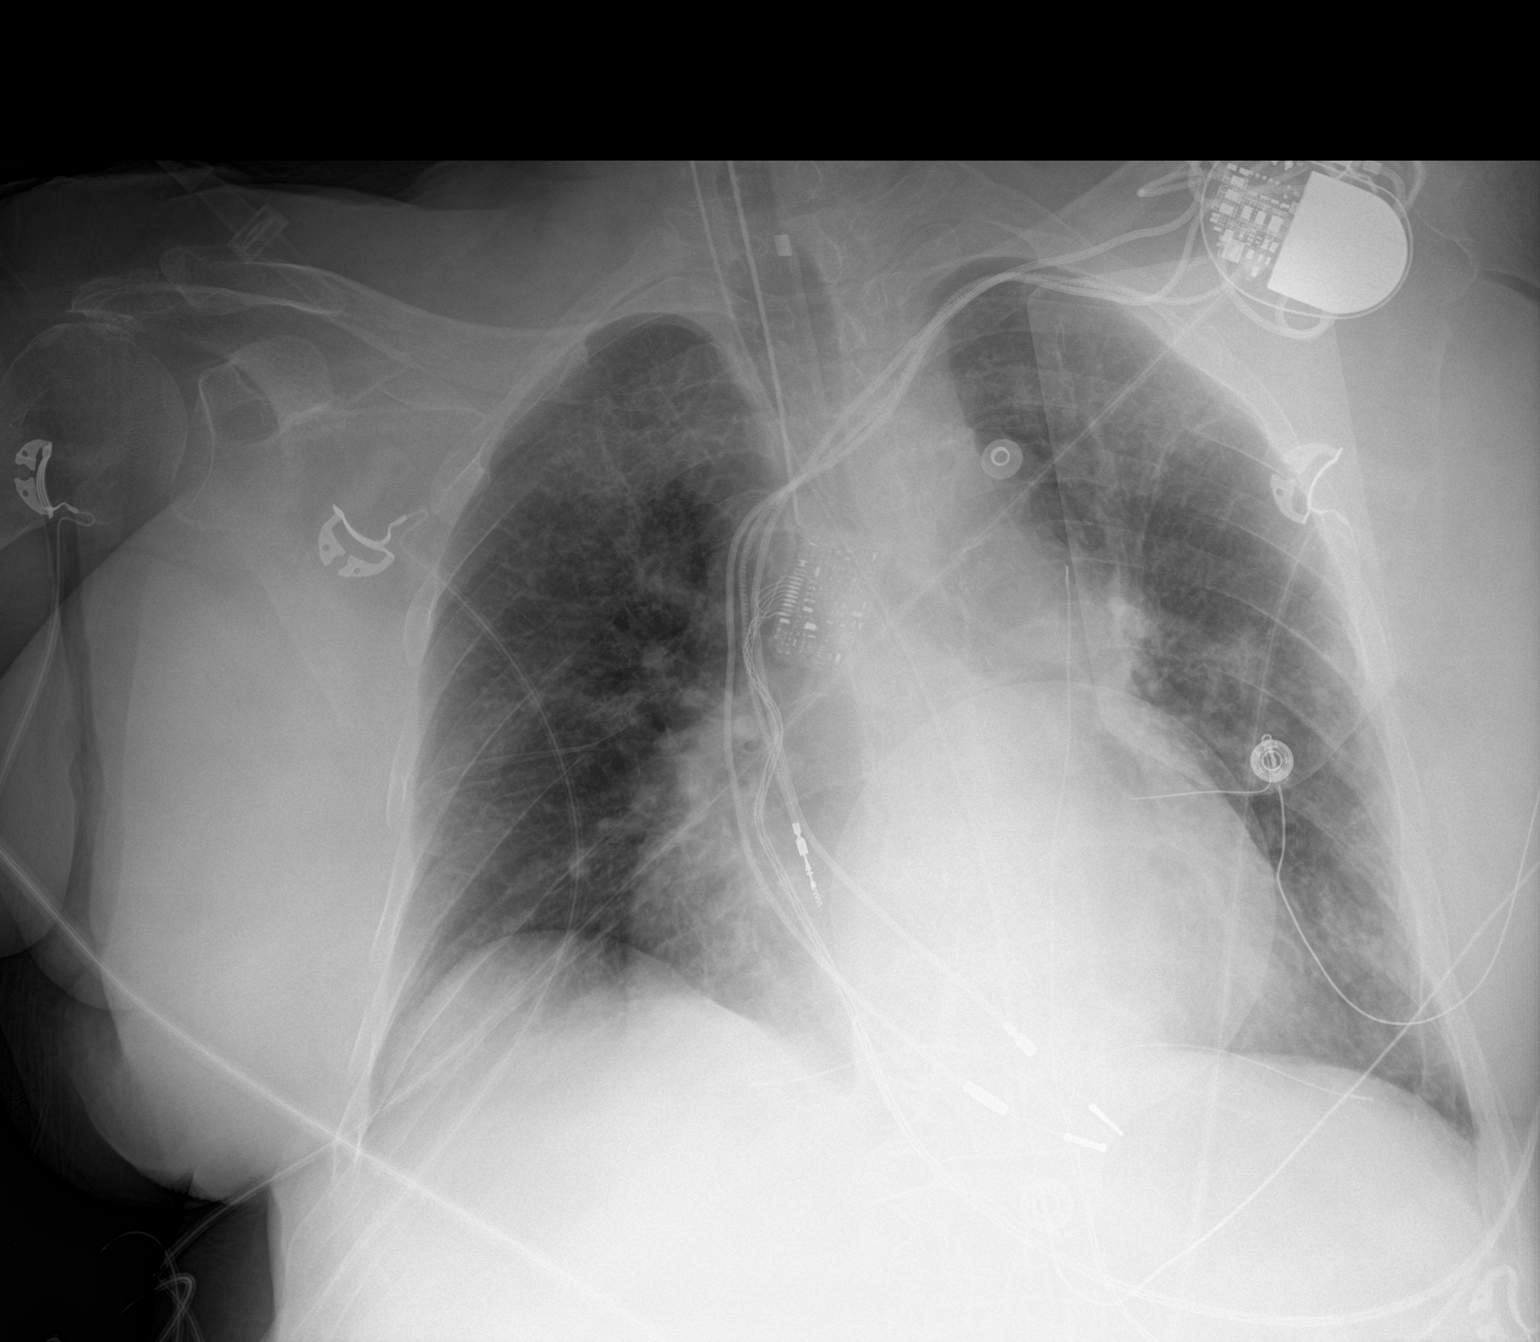

[1 of 1 positions shown; findings below may reference images not displayed]

FINDINGS: Endotracheal tube with tip projecting 2.0 cm above carina.

External pacing leads project over chest.

LEFT subclavian pacemaker leads project over RIGHT atrium and RIGHT
ventricle.

Nasogastric tube, tip of which not well visualized.

Mild enlargement of cardiac silhouette.

Patchy infiltrates in mid to lower LEFT lung.

Remaining lungs grossly clear.

No definite pleural effusion or pneumothorax.

Bones demineralized.
IMPRESSION: Patchy infiltrates in the mid to lower LEFT lung; these could
potentially reflect pneumonia or aspiration.
